# Patient Record
Sex: Male | Born: 1975 | Race: Black or African American | Hispanic: No | Marital: Single | Smoking: Current every day smoker
Health system: Southern US, Community
[De-identification: ages and names within clinical notes are randomized; demographics above are authoritative.]

## PROBLEM LIST (undated history)

## (undated) DIAGNOSIS — S62109A Fracture of unspecified carpal bone, unspecified wrist, initial encounter for closed fracture: Secondary | ICD-10-CM

## (undated) DIAGNOSIS — E079 Disorder of thyroid, unspecified: Secondary | ICD-10-CM

## (undated) DIAGNOSIS — M5430 Sciatica, unspecified side: Secondary | ICD-10-CM

## (undated) DIAGNOSIS — G43909 Migraine, unspecified, not intractable, without status migrainosus: Secondary | ICD-10-CM

## (undated) DIAGNOSIS — I10 Essential (primary) hypertension: Secondary | ICD-10-CM

## (undated) DIAGNOSIS — F419 Anxiety disorder, unspecified: Secondary | ICD-10-CM

## (undated) HISTORY — PX: NO PAST SURGERIES: SHX2092

---

## 1998-11-26 ENCOUNTER — Encounter: Payer: Self-pay | Admitting: *Deleted

## 1998-11-26 ENCOUNTER — Emergency Department (HOSPITAL_COMMUNITY): Admission: EM | Admit: 1998-11-26 | Discharge: 1998-11-26 | Payer: Self-pay | Admitting: *Deleted

## 2000-10-06 ENCOUNTER — Emergency Department (HOSPITAL_COMMUNITY): Admission: EM | Admit: 2000-10-06 | Discharge: 2000-10-07 | Payer: Self-pay | Admitting: Emergency Medicine

## 2000-10-07 ENCOUNTER — Emergency Department (HOSPITAL_COMMUNITY): Admission: EM | Admit: 2000-10-07 | Discharge: 2000-10-07 | Payer: Self-pay | Admitting: Emergency Medicine

## 2000-10-08 ENCOUNTER — Emergency Department (HOSPITAL_COMMUNITY): Admission: EM | Admit: 2000-10-08 | Discharge: 2000-10-08 | Payer: Self-pay | Admitting: Emergency Medicine

## 2001-02-15 ENCOUNTER — Emergency Department (HOSPITAL_COMMUNITY): Admission: EM | Admit: 2001-02-15 | Discharge: 2001-02-15 | Payer: Self-pay | Admitting: Emergency Medicine

## 2001-04-02 ENCOUNTER — Encounter: Payer: Self-pay | Admitting: Emergency Medicine

## 2001-04-02 ENCOUNTER — Emergency Department (HOSPITAL_COMMUNITY): Admission: EM | Admit: 2001-04-02 | Discharge: 2001-04-02 | Payer: Self-pay | Admitting: Emergency Medicine

## 2003-06-13 ENCOUNTER — Emergency Department (HOSPITAL_COMMUNITY): Admission: EM | Admit: 2003-06-13 | Discharge: 2003-06-13 | Payer: Self-pay | Admitting: Emergency Medicine

## 2003-08-15 ENCOUNTER — Emergency Department (HOSPITAL_COMMUNITY): Admission: EM | Admit: 2003-08-15 | Discharge: 2003-08-16 | Payer: Self-pay | Admitting: *Deleted

## 2006-02-04 ENCOUNTER — Emergency Department (HOSPITAL_COMMUNITY): Admission: EM | Admit: 2006-02-04 | Discharge: 2006-02-04 | Payer: Self-pay | Admitting: Emergency Medicine

## 2006-03-13 ENCOUNTER — Emergency Department (HOSPITAL_COMMUNITY): Admission: EM | Admit: 2006-03-13 | Discharge: 2006-03-14 | Payer: Self-pay | Admitting: Emergency Medicine

## 2008-10-26 ENCOUNTER — Emergency Department (HOSPITAL_COMMUNITY): Admission: EM | Admit: 2008-10-26 | Discharge: 2008-10-26 | Payer: Self-pay | Admitting: Family Medicine

## 2009-10-20 ENCOUNTER — Emergency Department (HOSPITAL_COMMUNITY): Admission: EM | Admit: 2009-10-20 | Discharge: 2009-10-20 | Payer: Self-pay | Admitting: Emergency Medicine

## 2010-01-17 ENCOUNTER — Emergency Department (HOSPITAL_COMMUNITY): Admission: EM | Admit: 2010-01-17 | Discharge: 2010-01-17 | Payer: Self-pay | Admitting: Emergency Medicine

## 2010-03-02 ENCOUNTER — Ambulatory Visit: Payer: Self-pay | Admitting: Internal Medicine

## 2010-06-06 LAB — POCT I-STAT, CHEM 8
BUN: 3 mg/dL — ABNORMAL LOW (ref 6–23)
Creatinine, Ser: 0.9 mg/dL (ref 0.4–1.5)
Potassium: 4 mEq/L (ref 3.5–5.1)
Sodium: 136 mEq/L (ref 135–145)

## 2011-01-20 ENCOUNTER — Ambulatory Visit (HOSPITAL_COMMUNITY): Payer: Self-pay

## 2011-01-20 ENCOUNTER — Emergency Department (HOSPITAL_COMMUNITY): Payer: Self-pay

## 2011-01-20 ENCOUNTER — Emergency Department (HOSPITAL_COMMUNITY)
Admission: EM | Admit: 2011-01-20 | Discharge: 2011-01-20 | Disposition: A | Discharge status: Home / Self Care | Payer: Self-pay | Attending: Emergency Medicine | Admitting: Emergency Medicine

## 2011-01-20 DIAGNOSIS — S1093XA Contusion of unspecified part of neck, initial encounter: Secondary | ICD-10-CM | POA: Insufficient documentation

## 2011-01-20 DIAGNOSIS — S0003XA Contusion of scalp, initial encounter: Secondary | ICD-10-CM | POA: Insufficient documentation

## 2011-01-20 DIAGNOSIS — I10 Essential (primary) hypertension: Secondary | ICD-10-CM | POA: Insufficient documentation

## 2011-01-20 DIAGNOSIS — W1809XA Striking against other object with subsequent fall, initial encounter: Secondary | ICD-10-CM | POA: Insufficient documentation

## 2011-01-20 DIAGNOSIS — Y92009 Unspecified place in unspecified non-institutional (private) residence as the place of occurrence of the external cause: Secondary | ICD-10-CM | POA: Insufficient documentation

## 2011-01-20 DIAGNOSIS — S0180XA Unspecified open wound of other part of head, initial encounter: Secondary | ICD-10-CM | POA: Insufficient documentation

## 2011-09-18 ENCOUNTER — Encounter (HOSPITAL_COMMUNITY): Payer: Self-pay | Admitting: Emergency Medicine

## 2011-09-18 ENCOUNTER — Emergency Department (HOSPITAL_COMMUNITY): Payer: Self-pay

## 2011-09-18 ENCOUNTER — Emergency Department (HOSPITAL_COMMUNITY)
Admission: EM | Admit: 2011-09-18 | Discharge: 2011-09-18 | Disposition: A | Discharge status: Home / Self Care | Payer: Self-pay | Attending: Emergency Medicine | Admitting: Emergency Medicine

## 2011-09-18 DIAGNOSIS — I10 Essential (primary) hypertension: Secondary | ICD-10-CM | POA: Insufficient documentation

## 2011-09-18 DIAGNOSIS — M25539 Pain in unspecified wrist: Secondary | ICD-10-CM | POA: Insufficient documentation

## 2011-09-18 DIAGNOSIS — F17200 Nicotine dependence, unspecified, uncomplicated: Secondary | ICD-10-CM | POA: Insufficient documentation

## 2011-09-18 HISTORY — DX: Essential (primary) hypertension: I10

## 2011-09-18 HISTORY — DX: Fracture of unspecified carpal bone, unspecified wrist, initial encounter for closed fracture: S62.109A

## 2011-09-18 MED ORDER — IBUPROFEN 400 MG PO TABS
800.0000 mg | ORAL_TABLET | Freq: Once | ORAL | Status: AC
  Administered 2011-09-18: 800 mg via ORAL
  Filled 2011-09-18: qty 2

## 2011-09-18 MED ORDER — HYDROCODONE-ACETAMINOPHEN 5-500 MG PO TABS: 1.0000 | ORAL_TABLET | Freq: Four times a day (QID) | ORAL | Status: AC | PRN

## 2011-09-18 MED ORDER — OXYCODONE-ACETAMINOPHEN 5-325 MG PO TABS: 1.0000 | ORAL_TABLET | Freq: Once | ORAL | Status: DC

## 2011-09-18 NOTE — Progress Notes (Signed)
Orthopedic Tech Progress Note Patient Details:  Caleb Heath Aug 02, 1975 [id number]  Ortho Devices Type of Ortho Device: Velcro wrist splint Ortho Device/Splint Location: right wrist Ortho Device/Splint Interventions: Application   Caleb Heath 09/18/2011, 7:35 PM

## 2011-09-18 NOTE — ED Notes (Signed)
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 CMS intact.

## 2011-09-18 NOTE — ED Provider Notes (Signed)
History     CSN: [id number]  Arrival date & time 09/18/11  1738   None     Chief Complaint  Patient presents with  . Wrist Pain    (Consider location/radiation/quality/duration/timing/severity/associated sxs/prior treatment) Patient is a 36 y.o. male presenting with wrist pain. The history is provided by the patient and a friend. No language interpreter was used.  Wrist Pain This is a recurrent problem. The current episode started yesterday. The problem occurs intermittently. The problem has been unchanged. Associated symptoms include arthralgias. Pertinent negatives include no fever, joint swelling, nausea, numbness, vomiting or weakness. The symptoms are aggravated by bending. He has tried nothing for the symptoms.  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  States that he had swelling and pain in the same wrist 2 weeks ago that resolved on its own.  No pmh of gout but intermittant great R toe pain.    Past Medical History  Diagnosis Date  . Fx wrist   . Hypertension     History reviewed. No pertinent past surgical history.  No family history on file.  History  Substance Use Topics  . Smoking status: Current Everyday Smoker  . Smokeless tobacco: Not on file  . Alcohol Use: Yes      Review of Systems  Constitutional: Negative.  Negative for fever.  HENT: Negative.   Eyes: Negative.   Respiratory: Negative.   Cardiovascular: Negative.   Gastrointestinal: Negative.  Negative for nausea and vomiting.  Musculoskeletal: Positive for arthralgias. Negative for joint swelling.  Neurological: Negative.  Negative for weakness and numbness.  Psychiatric/Behavioral: Negative.   All other systems reviewed and are negative.    Allergies  Review of patient's allergies indicates no known allergies.  Home Medications  No current outpatient prescriptions on file.  BP 155/91  Pulse 96  Temp 98.6 F (37 C) (Oral)  Resp 18  SpO2 100%  Physical Exam  Nursing  note and vitals reviewed. Constitutional: He is oriented to person, place, and time. He appears well-developed and well-nourished.  HENT:  Head: Normocephalic.  Eyes: Conjunctivae and EOM are normal. Pupils are equal, round, and reactive to light.  Neck: Normal range of motion. Neck supple.  Cardiovascular: Normal rate.   Pulmonary/Chest: Effort normal.  Abdominal: Soft.  Musculoskeletal: Normal range of motion.       Tender R wrist posterior R lateral no defomity noted.  2+ radial pulse.  Neurological: He is alert and oriented to person, place, and time.  Skin: Skin is warm and dry.  Psychiatric: He has a normal mood and affect.    ED Course  Procedures (including critical care time)  Labs Reviewed - No data to display Dg Wrist Complete Right  09/18/2011  *RADIOLOGY REPORT*  Clinical Data: Injury  RIGHT WRIST - COMPLETE 3+ VIEW  Comparison: None.  Findings: No acute fracture and no dislocation.  IMPRESSION: No acute bony pathology.  Original Report Authenticated By: Donavan Burnet, M.D.     No diagnosis found.    MDM  R wrist pain with negative films.  Ibuprofen for pain with vicodin rx.  Ice elevate.  Suspect gout.  Follow up with pcp        Remi Haggard, NP 09/18/11 2142

## 2011-09-18 NOTE — Discharge Instructions (Signed)
Caleb Heath the wrist pain could be gout pain.  Since this is recurrent and you also have great toe pain intermittantly.  Take ibuprofen 800mg  with food as needed for pain.  Take vicodin for severe pain but do not drive.  Stop drinking beer and eating red meat.  Google  Gout an read about this condition.  You may have a family member with the same condition.  Wear the wrist splint for comfort.

## 2011-09-18 NOTE — ED Notes (Signed)
Pt reports (R) wrist pain.  States that he has broken wrist x 2.  No swelling, obvious deformity noted.  Denies injury-reports working with wrists yesterday.  No distress noted.  Pulses 2+

## 2011-09-20 NOTE — ED Provider Notes (Signed)
Medical screening examination/treatment/procedure(s) were performed by non-physician practitioner and as supervising physician I was immediately available for consultation/collaboration.    Dayleen Beske L Jesika Men, MD 09/20/11 0002 

## 2012-08-24 ENCOUNTER — Encounter (HOSPITAL_COMMUNITY): Payer: Self-pay | Admitting: Emergency Medicine

## 2012-08-24 ENCOUNTER — Emergency Department (HOSPITAL_COMMUNITY)
Admission: EM | Admit: 2012-08-24 | Discharge: 2012-08-24 | Disposition: A | Discharge status: Home / Self Care | Payer: Self-pay | Attending: Emergency Medicine | Admitting: Emergency Medicine

## 2012-08-24 ENCOUNTER — Emergency Department (HOSPITAL_COMMUNITY): Payer: Self-pay

## 2012-08-24 DIAGNOSIS — R42 Dizziness and giddiness: Secondary | ICD-10-CM | POA: Insufficient documentation

## 2012-08-24 DIAGNOSIS — Z87828 Personal history of other (healed) physical injury and trauma: Secondary | ICD-10-CM | POA: Insufficient documentation

## 2012-08-24 DIAGNOSIS — F17200 Nicotine dependence, unspecified, uncomplicated: Secondary | ICD-10-CM | POA: Insufficient documentation

## 2012-08-24 DIAGNOSIS — R0789 Other chest pain: Secondary | ICD-10-CM | POA: Insufficient documentation

## 2012-08-24 DIAGNOSIS — R079 Chest pain, unspecified: Secondary | ICD-10-CM

## 2012-08-24 DIAGNOSIS — R0609 Other forms of dyspnea: Secondary | ICD-10-CM | POA: Insufficient documentation

## 2012-08-24 DIAGNOSIS — F411 Generalized anxiety disorder: Secondary | ICD-10-CM | POA: Insufficient documentation

## 2012-08-24 DIAGNOSIS — R0989 Other specified symptoms and signs involving the circulatory and respiratory systems: Secondary | ICD-10-CM | POA: Insufficient documentation

## 2012-08-24 DIAGNOSIS — H538 Other visual disturbances: Secondary | ICD-10-CM | POA: Insufficient documentation

## 2012-08-24 DIAGNOSIS — I10 Essential (primary) hypertension: Secondary | ICD-10-CM | POA: Insufficient documentation

## 2012-08-24 HISTORY — DX: Anxiety disorder, unspecified: F41.9

## 2012-08-24 LAB — CBC
Hemoglobin: 16 g/dL (ref 13.0–17.0)
MCH: 32.4 pg (ref 26.0–34.0)
MCV: 89.9 fL (ref 78.0–100.0)
RBC: 4.94 MIL/uL (ref 4.22–5.81)
WBC: 7.5 10*3/uL (ref 4.0–10.5)

## 2012-08-24 LAB — COMPREHENSIVE METABOLIC PANEL
ALT: 22 U/L (ref 0–53)
AST: 41 U/L — ABNORMAL HIGH (ref 0–37)
CO2: 27 mEq/L (ref 19–32)
Calcium: 9.7 mg/dL (ref 8.4–10.5)
Chloride: 103 mEq/L (ref 96–112)
Creatinine, Ser: 0.82 mg/dL (ref 0.50–1.35)
GFR calc Af Amer: >90 mL/min (ref >90)
GFR calc non Af Amer: >90 mL/min (ref >90)
Glucose, Bld: 87 mg/dL (ref 70–99)
Sodium: 142 mEq/L (ref 135–145)
Total Bilirubin: 0.2 mg/dL — ABNORMAL LOW (ref 0.3–1.2)

## 2012-08-24 LAB — URINALYSIS, ROUTINE W REFLEX MICROSCOPIC
Bilirubin Urine: NEGATIVE
Hgb urine dipstick: NEGATIVE
Nitrite: NEGATIVE
Protein, ur: NEGATIVE mg/dL
Specific Gravity, Urine: 1.012 (ref 1.005–1.030)
Urobilinogen, UA: 0.2 mg/dL (ref 0.0–1.0)

## 2012-08-24 LAB — POCT I-STAT TROPONIN I

## 2012-08-24 MED ORDER — HYDROCHLOROTHIAZIDE 12.5 MG PO CAPS: 12.5000 mg | ORAL_CAPSULE | Freq: Every day | ORAL | Status: DC

## 2012-08-24 NOTE — ED Provider Notes (Signed)
Medical screening examination/treatment/procedure(s) were conducted as a shared visit with non-physician practitioner(s) and myself.  I personally evaluated the patient during the encounter  Pt improved, resting when I enter the room I reviewed EKG Advised need to get his BP under control I doubt ACS/PE/Dissection at this time Stable for d/c  Joya Gaskins, MD 08/24/12 2024

## 2012-08-24 NOTE — ED Notes (Signed)
Was at work got dizzy and his chest started to hurt lefty side of chest some sob has hx of anxiety and htn

## 2012-08-24 NOTE — ED Notes (Signed)
States partied last night and had situation w/ girlfriend that he has been worried about  Denies drug use

## 2012-08-24 NOTE — ED Provider Notes (Signed)
History     CSN: [id number]  Arrival date & time 08/24/12  1153   First MD Initiated Contact with Patient 08/24/12 1326      Chief Complaint  Patient presents with  . Chest Pain    (Consider location/radiation/quality/duration/timing/severity/associated sxs/prior treatment) HPI  37 year old male presents emergency department with chief complaint of chest pain.  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  Patient states that his dizziness was overwhelming him to sit down.  Patient states he did inside the truck and then began having racing heart and chest pressure with tightness.  He has had associated dyspnea at.  This is all while at rest.  Patient has a history of anxiety attack.  He should also admits to heavy alcohol use yesterday.  He did not specify a specific amount but states it was "a lot."  The patient states he frequently binge drinks on his day off which is Sunday. Denies unilateral weakness, facial asymmetry, difficulty with speech, change in gait, or vertigo.   Past Medical History  Diagnosis Date  . Fx wrist   . Hypertension   . Anxiety     No past surgical history on file.  No family history on file.  History  Substance Use Topics  . Smoking status: Current Every Day Smoker  . Smokeless tobacco: Not on file  . Alcohol Use: Yes      Review of Systems Ten systems reviewed and are negative for acute change, except as noted in the HPI.   Allergies  Review of patient's allergies indicates no known allergies.  Home Medications  No current outpatient prescriptions on file.  BP 155/113  Pulse 118  Temp(Src) 98.8 F (37.1 C)  Resp 16  SpO2 100%  Physical Exam  Nursing note and vitals reviewed. Constitutional: He appears well-developed and well-nourished. No distress.  HENT:  Head: Normocephalic and atraumatic.  Eyes: Conjunctivae are normal. No scleral icterus.  Neck: Normal  range of motion. Neck supple.  Cardiovascular: Normal rate, regular rhythm and normal heart sounds.   Pulmonary/Chest: Effort normal and breath sounds normal. No respiratory distress.  Abdominal: Soft. There is no tenderness.  Musculoskeletal: He exhibits no edema.  Neurological: He is alert.  Skin: Skin is warm and dry. He is not diaphoretic.  Psychiatric: His behavior is normal.    ED Course  Procedures (including critical care time)  Labs Reviewed  COMPREHENSIVE METABOLIC PANEL - Abnormal; Notable for the following:    AST 41 (*)    Total Bilirubin 0.2 (*)    All other components within normal limits  URINALYSIS, ROUTINE W REFLEX MICROSCOPIC - Abnormal; Notable for the following:    APPearance CLOUDY (*)    All other components within normal limits  CBC  POCT I-STAT TROPONIN I   Dg Chest 2 View  08/24/2012   *RADIOLOGY REPORT*  Clinical Data: Chest pain  CHEST - 2 VIEW  Comparison: 03/13/2006  Findings: Cardiomediastinal silhouette is unremarkable.  No acute infiltrate or pleural effusion.  No pulmonary edema.  Bony thorax is unremarkable.  IMPRESSION: No active disease.   Original Report Authenticated By: Natasha Mead, M.D.     1. Chest pain      Date: 08/24/2012  Rate: 116   Rhythm: normal sinus rhythm  QRS Axis: normal  Intervals: normal  ST/T Wave abnormalities: ST elevations anteriorly and ST elevations laterally  Conduction Disutrbances:none  Narrative Interpretation: Abnormal ekg, same as  previous LVH   Old EKG Reviewed: unchanged    MDM   4:50 PM BP 158/92  Pulse 85  Temp(Src) 98.8 F (37.1 C)  Resp 18  SpO2 96% Patient here with chest pain complaint.  Also feeling tachycardic.  Differential includes heart attack, tach or arrhythmia, anxiety attack, alcohol withdrawal Vision head no symptoms at onset of initial visit. He is remained chest pain-free throughout and without symptoms of pounding or racing heart, dizziness, blurred vision Patient has had 2  negative troponins while here in the emergency department.  He has been seen in chair visit with Dr. Zadie Rhine. Spent approximately 3 minutes in consultation with the patient regarding his binge alcohol abuse. We'll discharge the patient with 12.5 HCTZ daily.  Resource guide given. The patient appears reasonably screened and/or stabilized for discharge and I doubt any other medical condition or other [facility] requiring further screening, evaluation, or treatment in the ED at this time prior to discharge.        Arthor Captain, PA-C 08/24/12 1656

## 2012-08-24 NOTE — ED Notes (Signed)
Pt c/o of lightheadedness while he was at work [employer]), reports he gets them often but this one lasted longer than normal. Pt reports during this episode he felt like his chest was "constricting", pt reports it felt like left sided chest tightness. Pt became SOB and diaphoretic and slightly blurred vision. Pt at first he thought it was just an anxiety attack but it didn't go away until pt arrived at ED, reports normally his anxiety attacks don't last that long. Pt reports he had just ate prior to this event. Pt has hx of HTN, pt reports he is not on any medications for this issue, pt denies checking it regularly. Pt denies n/v/d. Pt in nad, skin warm and dry, resp e/u.

## 2012-08-24 NOTE — ED Notes (Signed)
Has run out of bp meds x 1 year

## 2012-09-02 ENCOUNTER — Ambulatory Visit: Payer: Self-pay | Attending: Family Medicine | Admitting: Family Medicine

## 2012-09-02 ENCOUNTER — Encounter: Payer: Self-pay | Admitting: Family Medicine

## 2012-09-02 VITALS — BP 146/91 | HR 97 | Temp 98.7°F | Ht 74.0 in | Wt 175.0 lb

## 2012-09-02 DIAGNOSIS — R252 Cramp and spasm: Secondary | ICD-10-CM | POA: Insufficient documentation

## 2012-09-02 DIAGNOSIS — I10 Essential (primary) hypertension: Secondary | ICD-10-CM

## 2012-09-02 LAB — COMPREHENSIVE METABOLIC PANEL
Albumin: 4.7 g/dL (ref 3.5–5.2)
BUN: 7 mg/dL (ref 6–23)
CO2: 28 mEq/L (ref 19–32)
Calcium: 9.9 mg/dL (ref 8.4–10.5)
Chloride: 101 mEq/L (ref 96–112)
Glucose, Bld: 89 mg/dL (ref 70–99)
Potassium: 3.7 mEq/L (ref 3.5–5.3)
Sodium: 137 mEq/L (ref 135–145)
Total Protein: 7 g/dL (ref 6.0–8.3)

## 2012-09-02 MED ORDER — POTASSIUM CHLORIDE ER 10 MEQ PO TBCR: 10.0000 meq | EXTENDED_RELEASE_TABLET | Freq: Every day | ORAL | Status: DC

## 2012-09-02 MED ORDER — LISINOPRIL-HYDROCHLOROTHIAZIDE 10-12.5 MG PO TABS: 1.0000 | ORAL_TABLET | Freq: Every day | ORAL | Status: DC

## 2012-09-02 NOTE — Patient Instructions (Addendum)
Alcohol and Nutrition Nutrition serves two purposes. It provides energy. It also maintains body structure and function. Food supplies energy. It also provides the building blocks needed to replace worn or damaged cells. Alcoholics often eat poorly. This limits their supply of essential nutrients. This affects energy supply and structure maintenance. Alcohol also affects the body's nutrients in:  Digestion.  Storage.  Using and getting rid of waste products. IMPAIRMENT OF NUTRIENT DIGESTION AND UTILIZATION   Once ingested, food must be broken down into small components (digested). Then it is available for energy. It helps maintain body structure and function. Digestion begins in the mouth. It continues in the stomach and intestines, with help from the pancreas. The nutrients from digested food are absorbed from the intestines into the blood. Then they are carried to the liver. The liver prepares nutrients for:  Immediate use.  Storage and future use.  Alcohol inhibits the breakdown of nutrients into usable molecules.  It decreases secretion of digestive enzymes from the pancreas.  Alcohol impairs nutrient absorption by damaging the cells lining the stomach and intestines.  It also interferes with moving some nutrients into the blood.  In addition, nutritional deficiencies themselves may lead to further absorption problems.  For example, folate deficiency changes the cells that line the small intestine. This impairs how water is absorbed. It also affects absorbed nutrients. These include glucose, sodium, and additional folate.  Even if nutrients are digested and absorbed, alcohol can prevent them from being fully used. It changes their transport, storage, and excretion. Impaired utilization of nutrients by alcoholics is indicated by:  Decreased liver stores of vitamins, such as vitamin A.  Increased excretion of nutrients such as fat. ALCOHOL AND ENERGY SUPPLY   Three basic  nutritional components found in food are:  Carbohydrates.  Proteins.  Fats.  These are used as energy. Some alcoholics take in as much as 50% of their total daily calories from alcohol. They often neglect important foods.  Even when enough food is eaten, alcohol can impair the ways the body controls blood sugar (glucose) levels. It may either increase or decrease blood sugar.  In non-diabetic alcoholics, increased blood sugar (hyperglycemia) is caused by poor insulin secretion. It is usually temporary.  Decreased blood sugar (hypoglycemia) can cause serious injury even if this condition is short-lived. Low blood sugar can happen when a fasting or malnourished person drinks alcohol. When there is no food to supply energy, stored sugar is used up. The products of alcohol inhibit forming glucose from other compounds such as amino acids. As a result, alcohol causes the brain and other body tissue to lack glucose. It is needed for energy and function.  Alcohol is an energy source. But how the body processes and uses the energy from alcohol is complex. Also, when alcohol is substituted for carbohydrates, subjects tend to lose weight. This indicates that they get less energy from alcohol than from food. ALCOHOL - MAINTAINING CELL STRUCTURE AND FUNCTION  Structure Cells are made mostly of protein. So an adequate protein diet is important for maintaining cell structure. This is especially true if cells are being damaged. Research indicates that alcohol affects protein nutrition by causing impaired:  Digestion of proteins to amino acids.  Processing of amino acids by the small intestine and liver.  Synthesis of proteins from amino acids.  Protein secretion by the liver. Function Nutrients are essential for the body to function well. They provide the tools that the body needs to work well:  Proteins.  Vitamins.  Minerals. Alcohol can disrupt body function. It may cause nutrient  deficiencies. And it may interfere with the way nutrients are processed. Vitamins  Vitamins are essential to maintain growth and normal metabolism. They regulate many of the body`s processes. Chronic heavy drinking causes deficiencies in many vitamins. This is caused by eating less. And, in some cases, vitamins may be poorly absorbed. For example, alcohol inhibits fat absorption. It impairs how the vitamins A, E, and D are normally absorbed along with dietary fats. Not enough vitamin A may cause night blindness. Not enough vitamin D may cause softening of the bones.  Some alcoholics lack vitamins A, C, D, E, K, and the B vitamins. These are all involved in wound healing and cell maintenance. In particular, because vitamin K is necessary for blood clotting, lacking that vitamin can cause delayed clotting. The result is excess bleeding. Lacking other vitamins involved in brain function may cause severe neurological damage. Minerals Deficiencies of minerals such as calcium, magnesium, iron, and zinc are common in alcoholics. The alcohol itself does not seem to affect how these minerals are absorbed. Rather, they seem to occur secondary to other alcohol-related problems, such as:  Less calcium absorbed.  Not enough magnesium.  More urinary excretion.  Vomiting.  Diarrhea.  Not enough iron due to gastrointestinal bleeding.  Not enough zinc or losses related to other nutrient deficiencies.  Mineral deficiencies can cause a variety of medical consequences. These range from calcium-related bone disease to zinc-related night blindness and skin lesions. ALCOHOL, MALNUTRITION, AND MEDICAL COMPLICATIONS  Liver Disease   Alcoholic liver damage is caused primarily by alcohol itself. But poor nutrition may increase the risk of alcohol-related liver damage. For example, nutrients normally found in the liver are known to be affected by drinking alcohol. These include carotenoids, which are the major  sources of vitamin A, and vitamin E compounds. Decreases in such nutrients may play some role in alcohol-related liver damage. Pancreatitis  Research suggests that malnutrition may increase the risk of developing alcoholic pancreatitis. Research suggests that a diet lacking in protein may increase alcohol's damaging effect on the pancreas. Brain  Nutritional deficiencies may have severe effects on brain function. These may be permanent. Specifically, thiamine deficiencies are often seen in alcoholics. They can cause severe neurological problems. These include:  Impaired movement.  Memory loss seen in Wernicke-Korsakoff syndrome. Pregnancy  Alcohol has toxic effects on fetal development. It causes alcohol-related birth defects. They include fetal alcohol syndrome. Alcohol itself is toxic to the fetus. Also, the nutritional deficiency can affect how the fetus develops. That may compound the risk of developmental damage.  Nutritional needs during pregnancy are 10% to 30% greater than normal. Food intake can increase by as much as 140% to cover the needs of both mother and fetus. An alcoholic mother`s nutritional problems may adversely affect the nutrition of the fetus. And alcohol itself can also restrict nutrition flow to the fetus. NUTRITIONAL STATUS OF ALCOHOLICS  Techniques for assessing nutritional status include:  Taking body measurements to estimate fat reserves. They include:  Weight.  Height.  Mass.  Skin fold thickness.  Performing blood analysis to provide measurements of circulating:  Proteins.  Vitamins.  Minerals.  These techniques tend to be imprecise. For many nutrients, there is no clear "cut-off" point that would allow an accurate definition of deficiency. So assessing the nutritional status of alcoholics is limited by these techniques. Dietary status may provide information about the risk of developing nutritional problems.  Dietary status is assessed by:  Taking  patients' dietary histories.  Evaluating the amount and types of food they are eating.  It is difficult to determine what exact amount of alcohol begins to have damaging effects on nutrition. In general, moderate drinkers have 2 drinks or less per day. They seem to be at little risk for nutritional problems. Various medical disorders begin to appear at greater levels.  Research indicates that the majority of even the heaviest drinkers have few obvious nutritional deficiencies. Many alcoholics who are hospitalized for medical complications of their disease do have severe malnutrition. Alcoholics tend to eat poorly. Often they eat less than the amounts of food necessary to provide enough:  Carbohydrates.  Protein.  Fat.  Vitamins A and C.  B vitamins.  Minerals like calcium and iron. Of major concern is alcohol's effect on digesting food and use of nutrients. It may shift a mildly malnourished person toward severe malnutrition. Document Released: 01/03/2005 Document Revised: 06/03/2011 Document Reviewed: 06/19/2005 [facility] Patient Information 2014 [place], [place]. Muscle Cramps Muscle cramps are when muscles tighten by themselves. Muscle cramps usually improve or go away within minutes. HOME CARE  Massage the muscle.  Stretch the muscle.  Relax the muscle.  Only take medicine as told by your doctor.  Drink enough fluids to keep your pee (urine) clear or pale yellow. GET HELP RIGHT AWAY IF:  Cramps are frequent and do not get better with medicine. MAKE SURE YOU:  Understand these intructions.  Will watch your condition.  Will get help right away if your are not doing well or get worse. Document Released: 02/22/2008 Document Revised: 06/03/2011 Document Reviewed: 03/02/2008 [facility] Patient Information 2014 [place], [place]. Potassium (K) Potassium is an electrolyte that helps regulate the amount of fluid in the body. It also stimulates muscle contraction and maintains a  stable acid-base balance. Most of the body's potassium is inside of cells, and only a very small amount is in the blood. Because the amount in the blood is so small, minor changes can have big effects. PREPARATION FOR TEST Testing for potassium requires taking a blood sample taken by needle from a vein in the arm. The skin is cleaned thoroughly before the sample is drawn. There is no other special preparation needed. NORMAL FINDINGS  Adults: 3.5-5 mEq/L (3.5-5 mmol/L).  Premature neonates, cord blood: 5-10.2 mEq/L (5-10.2 mmol/L).  Premature neonates, 48 hours: 3-6 mEq/L (3-6 mmol/L).  Neonates: 3.7-5.9 mEq/L (3.7-5.9 mmol/L).  Neonates, cord blood: 5.6-12 mEq/L (5.6-12 mmol/L).  Infants: 4.1-5.3 mEq/L (4.1-5.3 mmol/L).  Children: 3.4-4.7 mEq/L (3.4-4.7 mmol/L). Ranges for normal findings may vary among different laboratories and hospitals. You should always check with your doctor after having lab work or other tests done to discuss the meaning of your test results and whether your values are considered within normal limits. MEANING OF TEST Your caregiver will go over the test results with you and discuss the importance and meaning of your results, as well as treatment options and the need for additional tests if necessary. OBTAINING THE TEST RESULTS It is your responsibility to obtain your test results. Ask the lab or department performing the test when and how you will get your results. Document Released: 04/13/2004 Document Revised: 06/03/2011 Document Reviewed: 02/21/2008 [facility] Patient Information 2014 [place], [place]. Hypertension As your heart beats, it forces blood through your arteries. This force is your blood pressure. If the pressure is too high, it is called hypertension (HTN) or high blood pressure. HTN is dangerous because you  may have it and not know it. High blood pressure may mean that your heart has to work harder to pump blood. Your arteries may be narrow or stiff. The  extra work puts you at risk for heart disease, stroke, and other problems.  Blood pressure consists of two numbers, a higher number over a lower, 110/72, for example. It is stated as "110 over 72." The ideal is below 120 for the top number (systolic) and under 80 for the bottom (diastolic). Write down your blood pressure today. You should pay close attention to your blood pressure if you have certain conditions such as:  Heart failure.  Prior heart attack.  Diabetes  Chronic kidney disease.  Prior stroke.  Multiple risk factors for heart disease. To see if you have HTN, your blood pressure should be measured while you are seated with your arm held at the level of the heart. It should be measured at least twice. A one-time elevated blood pressure reading (especially in the Emergency Department) does not mean that you need treatment. There may be conditions in which the blood pressure is different between your right and left arms. It is important to see your caregiver soon for a recheck. Most people have essential hypertension which means that there is not a specific cause. This type of high blood pressure may be lowered by changing lifestyle factors such as:  Stress.  Smoking.  Lack of exercise.  Excessive weight.  Drug/tobacco/alcohol use.  Eating less salt. Most people do not have symptoms from high blood pressure until it has caused damage to the body. Effective treatment can often prevent, delay or reduce that damage. TREATMENT  When a cause has been identified, treatment for high blood pressure is directed at the cause. There are a large number of medications to treat HTN. These fall into several categories, and your caregiver will help you select the medicines that are best for you. Medications may have side effects. You should review side effects with your caregiver. If your blood pressure stays high after you have made lifestyle changes or started on medicines,   Your  medication(s) may need to be changed.  Other problems may need to be addressed.  Be certain you understand your prescriptions, and know how and when to take your medicine.  Be sure to follow up with your caregiver within the time frame advised (usually within two weeks) to have your blood pressure rechecked and to review your medications.  If you are taking more than one medicine to lower your blood pressure, make sure you know how and at what times they should be taken. Taking two medicines at the same time can result in blood pressure that is too low. SEEK IMMEDIATE MEDICAL CARE IF:  You develop a severe headache, blurred or changing vision, or confusion.  You have unusual weakness or numbness, or a faint feeling.  You have severe chest or abdominal pain, vomiting, or breathing problems. MAKE SURE YOU:   Understand these instructions.  Will watch your condition.  Will get help right away if you are not doing well or get worse. Document Released: 03/11/2005 Document Revised: 06/03/2011 Document Reviewed: 10/30/2007 [facility] Patient Information 2014 [place], [place].

## 2012-09-02 NOTE — Progress Notes (Signed)
Patient ID: Caleb Heath, male   DOB: Oct 05, 1975, 37 y.o.   MRN: [id number]  CC: to establish  HPI: Pt is presenting today to follow up from ER visit where he was diagnosed with hypertension.  He was started on HCTZ 12.5 mg po daily.  He has been having cramps since he started the medication.  He works a very physical job.   No Known Allergies Past Medical History  Diagnosis Date  . Fx wrist   . Hypertension   . Anxiety    No current outpatient prescriptions on file prior to visit.   No current facility-administered medications on file prior to visit.   No family history on file. History   Social History  . Marital Status: Single    Spouse Name: N/A    Number of Children: N/A  . Years of Education: N/A   Occupational History  . Not on file.   Social History Main Topics  . Smoking status: Current Every Day Smoker  . Smokeless tobacco: Not on file  . Alcohol Use: Yes  . Drug Use: No  . Sexually Active: Not on file   Other Topics Concern  . Not on file   Social History Narrative  . No narrative on file    Review of Systems  Constitutional: Negative for fever, chills, diaphoresis, activity change, appetite change and fatigue.  HENT: Negative for ear pain, nosebleeds, congestion, facial swelling, rhinorrhea, neck pain, neck stiffness and ear discharge.   Eyes: Negative for pain, discharge, redness, itching and visual disturbance.  Respiratory: Negative for cough, choking, chest tightness, shortness of breath, wheezing and stridor.   Cardiovascular: Negative for chest pain, palpitations and leg swelling.  Gastrointestinal: Negative for abdominal distention.  Genitourinary: Negative for dysuria, urgency, frequency, hematuria, flank pain, decreased urine volume, difficulty urinating and dyspareunia.  Musculoskeletal: Negative for back pain, joint swelling, arthralgias and gait problem.  Neurological: Negative for dizziness, tremors, seizures, syncope, facial asymmetry,  speech difficulty, weakness, light-headedness, numbness and headaches.  Hematological: Negative for adenopathy. Does not bruise/bleed easily.  Psychiatric/Behavioral: Negative for hallucinations, behavioral problems, confusion, dysphoric mood, decreased concentration and agitation.    Objective:   Filed Vitals:   09/02/12 1208  BP: 146/91  Pulse: 97  Temp: 98.7 F (37.1 C)    Physical Exam  Constitutional: Appears well-developed and well-nourished. No distress.  HENT: Normocephalic. External right and left ear normal. Oropharynx is clear and moist.  Eyes: Conjunctivae and EOM are normal. PERRLA, no scleral icterus.  Neck: Normal ROM. Neck supple. No JVD. No tracheal deviation. No thyromegaly.  CVS: RRR, S1/S2 +, no murmurs, no gallops, no carotid bruit.  Pulmonary: Effort and breath sounds normal, no stridor, rhonchi, wheezes, rales.  Abdominal: Soft. BS +,  no distension, tenderness, rebound or guarding.  Musculoskeletal: Normal range of motion. No edema and no tenderness.  Lymphadenopathy: No lymphadenopathy noted, cervical, inguinal. Neuro: Alert. Normal reflexes, muscle tone coordination. No cranial nerve deficit. Skin: Skin is warm and dry. No rash noted. Not diaphoretic. No erythema. No pallor.  Psychiatric: Normal mood and affect. Behavior, judgment, thought content normal.   Lab Results  Component Value Date   WBC 7.5 08/24/2012   HGB 16.0 08/24/2012   HCT 44.4 08/24/2012   MCV 89.9 08/24/2012   PLT 229 08/24/2012   Lab Results  Component Value Date   CREATININE 0.82 08/24/2012   BUN 10 08/24/2012   NA 142 08/24/2012   K 3.9 08/24/2012   CL 103 08/24/2012   CO2  27 08/24/2012    No results found for this basename: HGBA1C   Lipid Panel  No results found for this basename: chol, trig, hdl, cholhdl, vldl, ldlcalc       Assessment and plan:   Patient Active Problem List   Diagnosis Date Noted  . Unspecified essential hypertension 09/02/2012  . Muscle cramps 09/02/2012    Trial of zestoretic 10/12.5 mg po daily for better blood pressure control  Check CMP today Add KCl 10 meq po daily to regimen Hydration recommended Alcohol cessation discussed [id number] The patient was counseled on the dangers of tobacco use, and was advised to quit.  Reviewed strategies to maximize success, including removing cigarettes and smoking materials from environment.   Follow up in 3 weeks  The patient was given clear instructions to go to ER or return to medical center if symptoms don't improve, worsen or new problems develop.  The patient verbalized understanding.  The patient was told to call to get lab results if they haven't heard anything in the next week.    Rodney Langton, MD, CDE, FAAFP [facility] [place], [place]

## 2012-09-03 ENCOUNTER — Telehealth: Payer: Self-pay | Admitting: *Deleted

## 2012-09-03 NOTE — Telephone Encounter (Signed)
09/03/12 Spoke with patient regarding labs came back WNL except that he had a mildly elevated liver enzyme. Patient  Instructed to avoid heavy alcohol consumption verbalize and understands.Appointment schedule for 12/04/12 for CMP. P.Jolyn Deshmukh,RN BSN [facility]

## 2012-09-03 NOTE — Progress Notes (Signed)
Quick Note:  Please inform patient that his labs came back within normal limits except that he had a mildly elevated liver enzyme. Patient should avoid heavy alcohol consumption and we should recheck his labs again in 3 months.  Rodney Langton, MD, CDE, FAAFP [facility] [place], [place]   ______

## 2012-09-29 ENCOUNTER — Emergency Department (HOSPITAL_COMMUNITY)
Admission: EM | Admit: 2012-09-29 | Discharge: 2012-09-30 | Disposition: A | Discharge status: Home / Self Care | Payer: Self-pay | Attending: Emergency Medicine | Admitting: Emergency Medicine

## 2012-09-29 ENCOUNTER — Encounter (HOSPITAL_COMMUNITY): Payer: Self-pay | Admitting: Emergency Medicine

## 2012-09-29 DIAGNOSIS — I10 Essential (primary) hypertension: Secondary | ICD-10-CM | POA: Insufficient documentation

## 2012-09-29 DIAGNOSIS — R252 Cramp and spasm: Secondary | ICD-10-CM | POA: Insufficient documentation

## 2012-09-29 DIAGNOSIS — R7989 Other specified abnormal findings of blood chemistry: Secondary | ICD-10-CM

## 2012-09-29 DIAGNOSIS — Z8659 Personal history of other mental and behavioral disorders: Secondary | ICD-10-CM | POA: Insufficient documentation

## 2012-09-29 DIAGNOSIS — E860 Dehydration: Secondary | ICD-10-CM | POA: Insufficient documentation

## 2012-09-29 DIAGNOSIS — Z8781 Personal history of (healed) traumatic fracture: Secondary | ICD-10-CM | POA: Insufficient documentation

## 2012-09-29 DIAGNOSIS — Z79899 Other long term (current) drug therapy: Secondary | ICD-10-CM | POA: Insufficient documentation

## 2012-09-29 DIAGNOSIS — R944 Abnormal results of kidney function studies: Secondary | ICD-10-CM | POA: Insufficient documentation

## 2012-09-29 DIAGNOSIS — R42 Dizziness and giddiness: Secondary | ICD-10-CM | POA: Insufficient documentation

## 2012-09-29 DIAGNOSIS — F17200 Nicotine dependence, unspecified, uncomplicated: Secondary | ICD-10-CM | POA: Insufficient documentation

## 2012-09-29 LAB — CBC WITH DIFFERENTIAL/PLATELET
Eosinophils Absolute: 0.1 10*3/uL (ref 0.0–0.7)
Hemoglobin: 15.8 g/dL (ref 13.0–17.0)
Lymphs Abs: 2.6 10*3/uL (ref 0.7–4.0)
MCH: 31.9 pg (ref 26.0–34.0)
Monocytes Relative: 13 % — ABNORMAL HIGH (ref 3–12)
Neutro Abs: 5.1 10*3/uL (ref 1.7–7.7)
Neutrophils Relative %: 57 % (ref 43–77)
Platelets: 217 10*3/uL (ref 150–400)
RBC: 4.96 MIL/uL (ref 4.22–5.81)
WBC: 8.9 10*3/uL (ref 4.0–10.5)

## 2012-09-29 LAB — POCT I-STAT TROPONIN I

## 2012-09-29 MED ORDER — LACTATED RINGERS IV BOLUS (SEPSIS)
1000.0000 mL | Freq: Once | INTRAVENOUS | Status: AC
  Administered 2012-09-30: 1000 mL via INTRAVENOUS

## 2012-09-29 MED ORDER — SODIUM CHLORIDE 0.9 % IV BOLUS (SEPSIS)
1000.0000 mL | Freq: Once | INTRAVENOUS | Status: AC
  Administered 2012-09-29: 1000 mL via INTRAVENOUS

## 2012-09-29 NOTE — ED Provider Notes (Signed)
History    CSN: [id number] Arrival date & time 09/29/12  2159  First MD Initiated Contact with Patient 09/29/12 2212     Chief Complaint  Patient presents with  . Diarrhea  . Muscle Pain    HPI Caleb Heath is a 37 y.o. 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 He says he's had occasional dizziness when he bends over this is associated with lightheadedness. He does not get dizzy or lightheaded when he stays upright. He also describes some chest wall cramping but denies any chest pains, shortness of breath, fever, chills, productive cough, vomiting or nausea. Patient does endorse 2 episodes of watery stools this evening. Cramps are been intermittent, cramping, moderate to severe. Patient says he drank some beer on 7/4, but denies daily alcohol use and denies any other illicit drug use. Past Medical History  Diagnosis Date  . Fx wrist   . Hypertension   . Anxiety    History reviewed. No pertinent past surgical history. No family history on file. History  Substance Use Topics  . Smoking status: Current Every Day Smoker  . Smokeless tobacco: Not on file  . Alcohol Use: Yes    Review of Systems At least 10pt or greater review of systems completed and are negative except where specified in the HPI.  Allergies  Review of patient's allergies indicates no known allergies.  Home Medications   Current Outpatient Rx  Name  Route  Sig  Dispense  Refill  . lisinopril-hydrochlorothiazide (PRINZIDE,ZESTORETIC) 10-12.5 MG per tablet   Oral   Take 1 tablet by mouth daily.   30 tablet   3   . potassium chloride (K-DUR) 10 MEQ tablet   Oral   Take 1 tablet (10 mEq total) by mouth daily.   30 tablet   3    BP 132/85  Pulse 127  Temp(Src) 98.4 F (36.9 C) (Oral)  Resp 18  SpO2 99% Physical Exam  Nursing notes  reviewed.  Electronic medical record reviewed. VITAL SIGNS:   Filed Vitals:   09/29/12 2330 09/29/12 2345 09/30/12 0000 09/30/12 0338  BP: 138/88 137/86 145/89 137/87  Pulse:    83  Temp:      TempSrc:      Resp: 18 21 18 18   SpO2:    97%   CONSTITUTIONAL: Awake, oriented, appears non-toxic HENT: Atraumatic, normocephalic, oral mucosa pink and moist, airway patent. Nares patent without drainage. External ears normal. EYES: Conjunctiva clear, EOMI, PERRLA NECK: Trachea midline, non-tender, supple CARDIOVASCULAR: Tachycardic rate, Normal rhythm, No murmurs, rubs, gallops PULMONARY/CHEST: Clear to auscultation, no rhonchi, wheezes, or rales. Symmetrical breath sounds. Non-tender. ABDOMINAL: Non-distended, soft, non-tender - no rebound or guarding.  BS normal. NEUROLOGIC: Non-focal, moving all four extremities, no gross sensory or motor deficits. EXTREMITIES: No clubbing, cyanosis, or edema SKIN: Warm, Dry, No erythema, No rash  ED Course  Procedures (including critical care time)  Date: 09/30/2012  Rate: 95  Rhythm: normal sinus rhythm  QRS Axis: normal  Intervals: normal  ST/T Wave abnormalities: ST elevation in V2 and V3, T-wave abnormality in V4 and V5  Conduction Disutrbances: none  Narrative Interpretation: EKG unchanged from prior EKG     Labs Reviewed  CBC WITH DIFFERENTIAL - Abnormal; Notable for the following:    Monocytes Relative 13 (*)    Monocytes Absolute  1.1 (*)    All other components within normal limits  COMPREHENSIVE METABOLIC PANEL - Abnormal; Notable for the following:    Glucose, Bld 102 (*)    Creatinine, Ser 1.48 (*)    AST 55 (*)    GFR calc non Af Amer 60 (*)    GFR calc Af Amer 69 (*)    All other components within normal limits  CK - Abnormal; Notable for the following:    Total CK 245 (*)    All other components within normal limits  URINALYSIS, ROUTINE W REFLEX MICROSCOPIC - Abnormal; Notable for the following:    Color, Urine AMBER (*)     Hgb urine dipstick SMALL (*)    All other components within normal limits  URINE MICROSCOPIC-ADD ON - Abnormal; Notable for the following:    Casts HYALINE CASTS (*)    All other components within normal limits  MAGNESIUM  URINE RAPID DRUG SCREEN (HOSP PERFORMED)  POCT I-STAT TROPONIN I   No results found. 1. Muscle cramps   2. Elevated serum creatinine   3. Dehydration     MDM  Caleb Heath is a 37 y.o. male who presents with muscle cramping. Patient is on hydrochlorothiazide also takes potassium supplementation, patient arrives tachycardic. Denies any chest pain, shortness of breath or syncope. Patient's EKG is unchanged from prior EKG. Patient was given fluids in the emergency department and evaluated for dehydration as well as HUS secondary to 2 loose stool, rhabdomyolysis. Labs are unremarkable, patient does have some dark-colored urine, his creatinine has elevated, suggestive of possible dehydration, he feels much better and has not had cramping since receiving fluids. CK is only mildly elevated to 45, electrolytes and replacing at this time. Secondary to patient's increasing creatinine, I will have him followup with his primary care physician Dr. Laural Benes, patient will drink increased amount of fluids while at work, return to the ER for any concerning symptoms.   Jones Skene, MD 09/30/12 [phone]

## 2012-09-29 NOTE — ED Provider Notes (Signed)
MSE was initiated and I personally evaluated the patient and placed orders (if any) at  10:33 PM on September 29, 2012.  The patient appears stable so that the remainder of the MSE may be completed by another provider.  Patient presents emergency department with chief complaint of diarrhea, muscle cramps, generalized body aches, and tachycardia. 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 He also endorses watery diarrhea. He is been taking potassium pills, but has been unable to tolerate them. Nothing makes his symptoms better or worse.  Patient is tachycardic the upper 120s to low 130s. Will move the patient to the acute side for further evaluation of muscle cramps, diarrhea, and tachycardia. I ordered basic labs, including EKG and CK, which will be reviewed and/or modified by provider continuing care.  Roxy Horseman, PA-C 09/29/12 2236

## 2012-09-29 NOTE — ED Notes (Signed)
PT. REPORTS GENERALIZED BODY ACHES/MUSCLE CRAMPS ( FEET/LEGS/ARMS/BACK) ONSET TODAY , DENIES INJURY OR FALL .

## 2012-09-30 LAB — URINE MICROSCOPIC-ADD ON

## 2012-09-30 LAB — RAPID URINE DRUG SCREEN, HOSP PERFORMED
Amphetamines: NOT DETECTED
Opiates: NOT DETECTED

## 2012-09-30 LAB — COMPREHENSIVE METABOLIC PANEL
ALT: 36 U/L (ref 0–53)
Albumin: 4.2 g/dL (ref 3.5–5.2)
Alkaline Phosphatase: 75 U/L (ref 39–117)
Chloride: 96 mEq/L (ref 96–112)
GFR calc Af Amer: 69 mL/min — ABNORMAL LOW (ref >90)
Glucose, Bld: 102 mg/dL — ABNORMAL HIGH (ref 70–99)
Potassium: 4 mEq/L (ref 3.5–5.1)
Sodium: 135 mEq/L (ref 135–145)
Total Bilirubin: 0.6 mg/dL (ref 0.3–1.2)
Total Protein: 7.4 g/dL (ref 6.0–8.3)

## 2012-09-30 LAB — URINALYSIS, ROUTINE W REFLEX MICROSCOPIC
Glucose, UA: NEGATIVE mg/dL
Leukocytes, UA: NEGATIVE
Nitrite: NEGATIVE
Protein, ur: NEGATIVE mg/dL
Urobilinogen, UA: 1 mg/dL (ref 0.0–1.0)

## 2012-09-30 NOTE — ED Provider Notes (Signed)
Medical screening examination/treatment/procedure(s) were performed by non-physician practitioner and as supervising physician I was immediately available for consultation/collaboration.  Markeita Alicia, MD 09/30/12 0156 

## 2012-12-04 ENCOUNTER — Other Ambulatory Visit: Payer: Self-pay

## 2013-09-04 ENCOUNTER — Emergency Department (HOSPITAL_COMMUNITY)
Admission: EM | Admit: 2013-09-04 | Discharge: 2013-09-04 | Discharge status: Against Medical Advice | Payer: Self-pay | Attending: Emergency Medicine | Admitting: Emergency Medicine

## 2013-09-04 ENCOUNTER — Encounter (HOSPITAL_COMMUNITY): Payer: Self-pay | Admitting: Emergency Medicine

## 2013-09-04 DIAGNOSIS — I10 Essential (primary) hypertension: Secondary | ICD-10-CM | POA: Insufficient documentation

## 2013-09-04 DIAGNOSIS — R1032 Left lower quadrant pain: Secondary | ICD-10-CM | POA: Insufficient documentation

## 2013-09-04 DIAGNOSIS — F17200 Nicotine dependence, unspecified, uncomplicated: Secondary | ICD-10-CM | POA: Insufficient documentation

## 2013-09-04 LAB — CBC WITH DIFFERENTIAL/PLATELET
BASOS ABS: 0 10*3/uL (ref 0.0–0.1)
Basophils Relative: 0 % (ref 0–1)
Eosinophils Absolute: 0.1 10*3/uL (ref 0.0–0.7)
Eosinophils Relative: 2 % (ref 0–5)
HEMATOCRIT: 39.4 % (ref 39.0–52.0)
HEMOGLOBIN: 13.6 g/dL (ref 13.0–17.0)
LYMPHS ABS: 4 10*3/uL (ref 0.7–4.0)
LYMPHS PCT: 54 % — AB (ref 12–46)
MCH: 31.4 pg (ref 26.0–34.0)
MCHC: 34.5 g/dL (ref 30.0–36.0)
MCV: 91 fL (ref 78.0–100.0)
MONO ABS: 0.5 10*3/uL (ref 0.1–1.0)
MONOS PCT: 6 % (ref 3–12)
NEUTROS ABS: 2.8 10*3/uL (ref 1.7–7.7)
Neutrophils Relative %: 38 % — ABNORMAL LOW (ref 43–77)
Platelets: 239 10*3/uL (ref 150–400)
RBC: 4.33 MIL/uL (ref 4.22–5.81)
RDW: 12.8 % (ref 11.5–15.5)
WBC: 7.4 10*3/uL (ref 4.0–10.5)

## 2013-09-04 LAB — COMPREHENSIVE METABOLIC PANEL
ALT: 20 U/L (ref 0–53)
AST: 34 U/L (ref 0–37)
Albumin: 4.4 g/dL (ref 3.5–5.2)
Alkaline Phosphatase: 72 U/L (ref 39–117)
BUN: 7 mg/dL (ref 6–23)
CHLORIDE: 98 meq/L (ref 96–112)
CO2: 20 meq/L (ref 19–32)
CREATININE: 0.74 mg/dL (ref 0.50–1.35)
Calcium: 9 mg/dL (ref 8.4–10.5)
GLUCOSE: 99 mg/dL (ref 70–99)
Potassium: 4.1 mEq/L (ref 3.7–5.3)
Sodium: 137 mEq/L (ref 137–147)
Total Protein: 7.6 g/dL (ref 6.0–8.3)

## 2013-09-04 LAB — LIPASE, BLOOD: Lipase: 50 U/L (ref 11–59)

## 2013-09-04 LAB — URINALYSIS, ROUTINE W REFLEX MICROSCOPIC
Bilirubin Urine: NEGATIVE
GLUCOSE, UA: NEGATIVE mg/dL
Hgb urine dipstick: NEGATIVE
KETONES UR: NEGATIVE mg/dL
LEUKOCYTES UA: NEGATIVE
Nitrite: NEGATIVE
PROTEIN: NEGATIVE mg/dL
Specific Gravity, Urine: 1.007 (ref 1.005–1.030)
UROBILINOGEN UA: 0.2 mg/dL (ref 0.0–1.0)
pH: 5 (ref 5.0–8.0)

## 2013-09-04 NOTE — ED Notes (Addendum)
LLQ pain x 1 hour.  Denies nausea, vomiting, and diarrhea.  Reports he drank 12 pack of beer and tequila tonight. Reports heavy etoh use since he was 38 yrs old.  Pt crying.  Unable to sit still due to pain.

## 2013-09-04 NOTE — ED Notes (Addendum)
Pt called out stating "I just wanna go". This RN to have him sign out AMA.

## 2013-09-04 NOTE — ED Notes (Signed)
Pt crying and very upset.  States he is scared because his Daddy died in 2003 from colon cancer.

## 2013-09-04 NOTE — ED Notes (Addendum)
Pt is upset with the wait, stating "I just want to get home to my kids". He also states that he wants to know what is causing his pain, because he had a family member die after having the same pain. Lavella LemonsManly, MD is aware.

## 2013-09-06 ENCOUNTER — Emergency Department (HOSPITAL_COMMUNITY)
Admission: EM | Admit: 2013-09-06 | Discharge: 2013-09-06 | Disposition: A | Discharge status: Home / Self Care | Payer: Self-pay | Attending: Emergency Medicine | Admitting: Emergency Medicine

## 2013-09-06 ENCOUNTER — Encounter (HOSPITAL_COMMUNITY): Payer: Self-pay | Admitting: Emergency Medicine

## 2013-09-06 DIAGNOSIS — F17200 Nicotine dependence, unspecified, uncomplicated: Secondary | ICD-10-CM | POA: Insufficient documentation

## 2013-09-06 DIAGNOSIS — R109 Unspecified abdominal pain: Secondary | ICD-10-CM

## 2013-09-06 DIAGNOSIS — Z8781 Personal history of (healed) traumatic fracture: Secondary | ICD-10-CM | POA: Insufficient documentation

## 2013-09-06 DIAGNOSIS — R1032 Left lower quadrant pain: Secondary | ICD-10-CM | POA: Insufficient documentation

## 2013-09-06 DIAGNOSIS — Z8659 Personal history of other mental and behavioral disorders: Secondary | ICD-10-CM | POA: Insufficient documentation

## 2013-09-06 LAB — URINALYSIS, ROUTINE W REFLEX MICROSCOPIC
Bilirubin Urine: NEGATIVE
GLUCOSE, UA: NEGATIVE mg/dL
Hgb urine dipstick: NEGATIVE
Ketones, ur: NEGATIVE mg/dL
LEUKOCYTES UA: NEGATIVE
Nitrite: NEGATIVE
PH: 5.5 (ref 5.0–8.0)
PROTEIN: NEGATIVE mg/dL
Specific Gravity, Urine: 1.018 (ref 1.005–1.030)
Urobilinogen, UA: 1 mg/dL (ref 0.0–1.0)

## 2013-09-06 LAB — COMPREHENSIVE METABOLIC PANEL
ALBUMIN: 4.7 g/dL (ref 3.5–5.2)
ALT: 21 U/L (ref 0–53)
AST: 49 U/L — AB (ref 0–37)
Alkaline Phosphatase: 72 U/L (ref 39–117)
BUN: 10 mg/dL (ref 6–23)
CALCIUM: 9.6 mg/dL (ref 8.4–10.5)
CO2: 22 mEq/L (ref 19–32)
CREATININE: 0.68 mg/dL (ref 0.50–1.35)
Chloride: 98 mEq/L (ref 96–112)
GFR calc Af Amer: >90 mL/min (ref >90)
GFR calc non Af Amer: >90 mL/min (ref >90)
Glucose, Bld: 68 mg/dL — ABNORMAL LOW (ref 70–99)
Potassium: 4.4 mEq/L (ref 3.7–5.3)
Sodium: 138 mEq/L (ref 137–147)
TOTAL PROTEIN: 7.8 g/dL (ref 6.0–8.3)
Total Bilirubin: 0.3 mg/dL (ref 0.3–1.2)

## 2013-09-06 LAB — CBC WITH DIFFERENTIAL/PLATELET
BASOS ABS: 0 10*3/uL (ref 0.0–0.1)
BASOS PCT: 1 % (ref 0–1)
EOS PCT: 1 % (ref 0–5)
Eosinophils Absolute: 0.1 10*3/uL (ref 0.0–0.7)
HEMATOCRIT: 41.4 % (ref 39.0–52.0)
Hemoglobin: 14.2 g/dL (ref 13.0–17.0)
Lymphocytes Relative: 33 % (ref 12–46)
Lymphs Abs: 2.8 10*3/uL (ref 0.7–4.0)
MCH: 31.3 pg (ref 26.0–34.0)
MCHC: 34.3 g/dL (ref 30.0–36.0)
MCV: 91.2 fL (ref 78.0–100.0)
MONO ABS: 0.7 10*3/uL (ref 0.1–1.0)
Monocytes Relative: 9 % (ref 3–12)
NEUTROS ABS: 4.7 10*3/uL (ref 1.7–7.7)
Neutrophils Relative %: 56 % (ref 43–77)
Platelets: 261 10*3/uL (ref 150–400)
RBC: 4.54 MIL/uL (ref 4.22–5.81)
RDW: 13 % (ref 11.5–15.5)
WBC: 8.4 10*3/uL (ref 4.0–10.5)

## 2013-09-06 LAB — LIPASE, BLOOD: Lipase: 38 U/L (ref 11–59)

## 2013-09-06 MED ORDER — HYDROCHLOROTHIAZIDE 25 MG PO TABS: 25.0000 mg | ORAL_TABLET | Freq: Every day | ORAL | Status: DC

## 2013-09-06 NOTE — ED Notes (Addendum)
Patient c/o LLQ abdominal pain, [facility] for [facility] same, left AMA s/t fear of being in hospital. Denies n/v/d. Pt has HTN, does not have medication for HTN.

## 2013-09-06 NOTE — Progress Notes (Signed)
[facility] CL provided pt with a list of primary care resources, [facility] Orange Card application, and ACA information.  °

## 2013-09-06 NOTE — ED Notes (Signed)
Bed: [facility] Expected date:  Expected time:  Means of arrival:  Comments: 

## 2013-09-06 NOTE — Discharge Instructions (Signed)
Abdominal Pain, Adult Take Tylenol as directed for pain. Tylenol is safer than ibuprofen in a person who has high blood pressure. You have been written a prescription for blood pressure medication. Call the wellness Center tomorrow to get the next available appointment to care for your blood pressure and other health problems. Or you can call any of the numbers on the resource guide to get a primary care physician. Your blood pressure recheck within the next 2 or 3 weeks at your doctor's office or at an urgent care Center Many things can cause abdominal pain. Usually, abdominal pain is not caused by a disease and will improve without treatment. It can often be observed and treated at home. Your health care provider will do a physical exam and possibly order blood tests and X-rays to help determine the seriousness of your pain. However, in many cases, more time must pass before a clear cause of the pain can be found. Before that point, your health care provider may not know if you need more testing or further treatment. HOME CARE INSTRUCTIONS  Monitor your abdominal pain for any changes. The following actions may help to alleviate any discomfort you are experiencing:  Only take over-the-counter or prescription medicines as directed by your health care provider.  Do not take laxatives unless directed to do so by your health care provider.  Try a clear liquid diet (broth, tea, or water) as directed by your health care provider. Slowly move to a bland diet as tolerated. SEEK MEDICAL CARE IF:  You have unexplained abdominal pain.  You have abdominal pain associated with nausea or diarrhea.  You have pain when you urinate or have a bowel movement.  You experience abdominal pain that wakes you in the night.  You have abdominal pain that is worsened or improved by eating food.  You have abdominal pain that is worsened with eating fatty foods. SEEK IMMEDIATE MEDICAL CARE IF:   Your pain does not go  away within 2 hours.  You have a fever.  You keep throwing up (vomiting).  Your pain is felt only in portions of the abdomen, such as the right side or the left lower portion of the abdomen.  You pass bloody or black tarry stools. MAKE SURE YOU:  Understand these instructions.   Will watch your condition.   Will get help right away if you are not doing well or get worse.  Document Released: 12/19/2004 Document Revised: 12/30/2012 Document Reviewed: 11/18/2012 [facility] Patient Information 2014 [employer] [place].  Emergency Department Resource Guide 1) Find a Doctor and Pay Out of Pocket Although you won't have to find out who is covered by your insurance plan, it is a good idea to ask around and get recommendations. You will then need to call the office and see if the doctor you have chosen will accept you as a new patient and what types of options they offer for patients who are self-pay. Some doctors offer discounts or will set up payment plans for their patients who do not have insurance, but you will need to ask so you aren't surprised when you get to your appointment.  2) Contact Your Local Health Department Not all health departments have doctors that can see patients for sick visits, but many do, so it is worth a call to see if yours does. If you don't know where your local health department is, you can check in your phone book. The CDC also has a tool to help you locate  your state's health department, and many state websites also have listings of all of their local health departments.  3) Find a [facility] If your illness is not likely to be very severe or complicated, you may want to try a walk in clinic. These are popping up all over the country in pharmacies, drugstores, and shopping centers. They're usually staffed by nurse practitioners or physician assistants that have been trained to treat common illnesses and complaints. They're usually fairly quick and inexpensive.  However, if you have serious medical issues or chronic medical problems, these are probably not your best option.  No Primary Care Doctor: - Call Health Connect at  [phone] - they can help you locate a primary care doctor that  accepts your insurance, provides certain services, etc. - Physician Referral Service- [phone]  Chronic Pain Problems: Organization         Address  Phone   Notes  [facility]  [phone] Patients need to be referred by their primary care doctor.   Medication Assistance: Organization         Address  Phone   Notes  [facility] Medication [facility] [address] [place], [place] [address] [phone] --Must be a resident of [facility] -- Must have NO insurance coverage whatsoever (no Medicaid/ Medicare, etc.) -- The pt. MUST have a primary care doctor that directs their care regularly and follows them in the community   [facility]ist  [phone]   [employer]  [phone]    Agencies that provide inexpensive medical care: Organization         Address  Phone   Notes  [place]  [phone]   [facility]    [phone]   [facility] [place], [place] [address] [phone]   [place] [address], [place] [phone]   [facility]anned Parenthood    [phone]   [facility]    [phone]   [place] [address], [place] Phone:  [phone], Fax:  [phone] Hours of Operation:  9 am - 6 pm, M-F.  Also accepts Medicaid/Medicare and self-pay.  [facility] for [address] [place], [address], [place] Phone: [phone], Fax: [phone]. Hours of Operation:  8:30 am - 5:30 pm, M-F.  Also accepts Medicaid and self-pay.  [facility] [place] [address], [place] Phone: [phone]   [place], [place], [place] [phone], Ext. [phone] Mondays & Thursdays: 7-9 AM.  First 15 patients are seen on a first come, first serve basis.    [place] Providers:  Organization         Address  Phone   Notes  [facility] [address], [place] [phone] Also accepts self-pay patients.  [facility] [address], [place]  [phone]   [place], [address], [place] [phone]   [facility] [address] [place] [phone]   Lucianne Lei [address], [place]   [phone] Only accepts [place] patients after they have their name applied to their card.   Self-Pay (no insurance) in [facility]:  Organization  Address  Phone   Notes  °Sickle Cell Patients, [facility] [address], [place] [phone]   °[facility] [address], [place] [phone]) [phone]   °[facility] ° [address], [place] [phone]   °[facility]/Dr. Osei-Bonsu ° [address] [place] [address], [place] or [address], [place] [phone] Phone number for both [place] and [place] locations is the same.  °[facility] [address], [place] [phone]   °[facility] [address] [place] [address], [place] or [address] ([phone]) [phone] °([phone]) [phone]   °[facility] [address], [place] ([phone]) [phone], phone; [phone], fax Sees patients 1st and 3rd Saturday of every month.  Must not qualify for public or private insurance (i.e. Medicaid, Medicare, [place] Health Choice, Veterans' Benefits) • Household income should be no more than 200% of the poverty level •The clinic cannot treat you if you are pregnant or think  you are pregnant • Sexually transmitted diseases are not treated at the clinic.  ° ° °Dental Care: °Organization         Address  Phone  Notes  °[facility] [address], [place] ([phone]) [phone] Accepts children up to age 21 who are enrolled in Medicaid or [place] Health Choice; pregnant women with a Medicaid card; and children who have applied for Medicaid or [place] Health Choice, but were declined, whose parents can pay a reduced fee at time of service.  °[facility] [place]  [address], [place] ([phone]) [phone] Accepts children up to age 21 who are enrolled in Medicaid or [place] Health Choice; pregnant women with a Medicaid card; and children who have applied for Medicaid or [place] Health Choice, but were declined, whose parents can pay a reduced fee at time of service.  °[facility] ° [address], [place] [phone]) [phone] Patients are seen by appointment only. Walk-ins are not accepted. [facility] will see patients 18 years of age and older. °Monday - Tuesday (8am-5pm) °Most Wednesdays (8:30-5pm) °$30 per visit, cash only  °[facility] ° [address], [place] [phone]) [phone] Patients are seen by appointment only. Walk-ins are not accepted. [facility] will see patients 18 years of age and older. °One Wednesday Evening (Monthly: Volunteer Based).  $30 per visit, cash only  °[facility]  ([phone] for adults; Children under age 4, call Graduate Pediatric Dentistry at [phone]. Children aged 4-14, please call [phone] to request a pediatric application. ° Dental services are provided in all areas of dental care including fillings, crowns and bridges, complete and partial dentures, implants, gum treatment, root canals, and extractions. Preventive care is also provided. Treatment is provided to both adults and  children. °Patients are selected via a lottery and there is often a waiting list. °  °[facility] [address], °[place] ° ([phone]) 763-8833 [email or web address] °  °[facility] [address], [place], [place] ([phone], Ext. 123 Second and Fourth Thursday of each month, opens at 6:30 AM; Clinic ends at 9 AM.  Patients are seen on a first-come first-served basis, and a limited number are seen during each clinic.  ° °[facility] ° [address], [place], [place] [phone]) [phone]   Eligibility Requirements °You must have lived in [place], [place], or [place] counties for at least the last three months. °  You cannot be eligible for state or federal sponsored healthcare insurance, including Veterans Administration, Medicaid, or Medicare. °  You generally cannot be eligible for healthcare insurance through your employer.  °  How to apply: °Eligibility screenings are held every Tuesday and Wednesday afternoon from 1:00 pm until 4:00 pm. You do not need an appointment for the interview!  °[facility] [address], [place], [place] [phone]   °[facility]  [phone]   °[facility]  336-703-3100   °[facility]  336-570-6415   ° °Behavioral Health Resources in the Community: °Intensive Outpatient Programs °Organization         Address  Phone  Notes  °[facility] [address], [place], [place] [phone]   °[facility] [address], [place], [place] [phone]   °[facility] [address], [place], [place] ° 336-882-2125   °[facility] [address],  °[place], [place] 1-800-853-5163 or 336-641-4981   °Substance Abuse Resources °Organization         Address  Phone  Notes  °[facility]  336-882-2125   °[facility]  336-784-9470   °[facility]  336-285-9073     °[facility]  336-845-3988   °Residential & Outpatient Substance Abuse Program  1-800-659-3381   °Psychological Services °Organization         Address  Phone  Notes  °[facility]  336- 832-9600   °[facility]  336- 378-7881   °[facility] [address], [place] 1-800-853-5163 or 336-641-4981   ° °Mobile Crisis Teams °Organization         Address  Phone  Notes  °[facility], Mobile Crisis Care Unit  1-877-626-1772   °Assertive °[facility] ° [address] [place], [place] 336-834-9664   °Sharon DeEsch [address] °[place] 336-554-5454   ° °Self-Help/Support Groups °Organization         Address  Phone             Notes  °[facility] [place] - variety of support groups  336- 373-1402 Call for more information  °Narcotics Anonymous (NA), [facility] [address], °[place]  2 meetings at this location  ° °Residential Treatment Programs °Organization         Address  Phone  Notes  °[facility] [address],    °[place]  1-866-801-8205   °[facility] ° [address], [place], [place] 704-293-8524   °[facility] [address], [place] 336-845-3988 Admissions: 8am-3pm M-F  °[facility] [address]    °[place], [place] 336-841-1104   °[facility] [address], [place], [place] 336-379-7146   °[facility] [address]  °[place], [place] 336-285-9073   °[facility] - Intensive Outpatient [address], [place], [place] 336-852-3033   °[facility] ([facility] [address]  °[place], [place] 1-877-615-2722 or 336-784-9470   °[facility] ([facility]) [address] [place], [place] 336-227-7417 Accepts Medicaid  °[facility] [address]  °[place] 1-800-659-3381 Substance Abuse/Addiction Treatment  ° °[place]  Behavioral Health Resources °  Organization         Address  Phone  Notes  [facility]  (320) [phone](888) [phone]   Angie FavaJulie Brannon, PhD [address], [address] [place], [place]   ([phone](336) [phone] or [phone](336) [phone]   [facility]   [address] [place], [place] [phone](336) [phone]   [facility] [address], [place], [place] [phone](336) [phone] Insurance/Medicaid/sponsorship through [facility]  [facility] [address]                                    [place], [place] [phone](336) [phone] Therapy/tele-psych/case  [facility] [address]   [place], [place] [phone](336) [phone]    Dr. Lolly MustacheArfeen  [phone]) [phone]   [facility]  [employer] [facility] 1) [address], [place] 2) [address], [place] 3)  [address], [place] [phone]) [phone](336) [phone]  [phone](336) [phone]   [facility] Child Abuse Hotline [phone](336) 214-756-1372 or [phone](336) [phone] (After Hours)

## 2013-09-06 NOTE — ED Notes (Signed)
Pt escorted to discharge window. Pt verbalized understanding discharge instructions. In no acute distress.  

## 2013-09-06 NOTE — ED Provider Notes (Signed)
CSN: [id number]     Arrival date & time 09/06/13  1343 History   First MD Initiated Contact with Patient 09/06/13 1451     Chief Complaint  Patient presents with  . Abdominal Pain     (Consider location/radiation/quality/duration/timing/severity/associated sxs/prior Treatment) HPI Left-sided abdominal pain onset evening of 09/03/2013. Pain is constant made worse with changing positions improved with remaining still or sleeping. Treated with ibuprofen with partial relief. No appetite change. Last bowel movement this morning, normal no fever no urinary symptoms no other associated symptoms. Patient admits to drinking more alcohol than normal this past weekeend Past Medical History  Diagnosis Date  . Fx wrist   . Hypertension   . Anxiety    History reviewed. No pertinent past surgical history. History reviewed. No pertinent family history. History  Substance Use Topics  . Smoking status: Current Every Day Smoker  . Smokeless tobacco: Not on file  . Alcohol Use: Yes    Review of Systems  Constitutional: Negative.   HENT: Negative.   Respiratory: Negative.   Cardiovascular: Negative.   Gastrointestinal: Positive for abdominal pain.  Musculoskeletal: Negative.   Skin: Negative.   Neurological: Negative.   Psychiatric/Behavioral: Negative.   All other systems reviewed and are negative.     Allergies  Review of patient's allergies indicates no known allergies.  Home Medications   Prior to Admission medications   Medication Sig Start Date End Date Taking? Authorizing Provider  ibuprofen (ADVIL,MOTRIN) 200 MG tablet Take 200 mg by mouth every 6 (six) hours as needed.   Yes Historical Provider, MD   BP 163/100  Pulse 105  Temp(Src) 98.5 F (36.9 C) (Oral)  Resp 16  SpO2 100% Physical Exam  Nursing note and vitals reviewed. Constitutional: He appears well-developed and well-nourished.  HENT:  Head: Normocephalic and atraumatic.  Eyes: Conjunctivae are normal. Pupils  are equal, round, and reactive to light.  Neck: Neck supple. No tracheal deviation present. No thyromegaly present.  Cardiovascular: Normal rate and regular rhythm.   No murmur heard. Pulmonary/Chest: Effort normal and breath sounds normal.  Abdominal: Soft. Bowel sounds are normal. He exhibits no distension and no mass. There is tenderness. There is no guarding.  Mildly tender left lower quadrant  Genitourinary: Penis normal.  Normal male genitalia.no masses or signs of hernia  Musculoskeletal: Normal range of motion. He exhibits no edema and no tenderness.  Neurological: He is alert. Coordination normal.  Skin: Skin is warm and dry. No rash noted.  Psychiatric: He has a normal mood and affect.    ED Course  Procedures (including critical care time) Labs Review Labs Reviewed  CBC WITH DIFFERENTIAL  COMPREHENSIVE METABOLIC PANEL  LIPASE, BLOOD  URINALYSIS, ROUTINE W REFLEX MICROSCOPIC    Imaging Review No results found.   EKG Interpretation None     Results for orders placed during the hospital encounter of 09/06/13  CBC WITH DIFFERENTIAL      Result Value Ref Range   WBC 8.4  4.0 - 10.5 K/uL   RBC 4.54  4.22 - 5.81 MIL/uL   Hemoglobin 14.2  13.0 - 17.0 g/dL   HCT 40.941.4  81.139.0 - 91.452.0 %   MCV 91.2  78.0 - 100.0 fL   MCH 31.3  26.0 - 34.0 pg   MCHC 34.3  30.0 - 36.0 g/dL   RDW 78.213.0  95.611.5 - 21.315.5 %   Platelets 261  150 - 400 K/uL   Neutrophils Relative % 56  43 - 77 %  Neutro Abs 4.7  1.7 - 7.7 K/uL   Lymphocytes Relative 33  12 - 46 %   Lymphs Abs 2.8  0.7 - 4.0 K/uL   Monocytes Relative 9  3 - 12 %   Monocytes Absolute 0.7  0.1 - 1.0 K/uL   Eosinophils Relative 1  0 - 5 %   Eosinophils Absolute 0.1  0.0 - 0.7 K/uL   Basophils Relative 1  0 - 1 %   Basophils Absolute 0.0  0.0 - 0.1 K/uL  COMPREHENSIVE METABOLIC PANEL      Result Value Ref Range   Sodium 138  137 - 147 mEq/L   Potassium 4.4  3.7 - 5.3 mEq/L   Chloride 98  96 - 112 mEq/L   CO2 22  19 - 32 mEq/L    Glucose, Bld 68 (*) 70 - 99 mg/dL   BUN 10  6 - 23 mg/dL   Creatinine, Ser 4.090.68  0.50 - 1.35 mg/dL   Calcium 9.6  8.4 - 81.110.5 mg/dL   Total Protein 7.8  6.0 - 8.3 g/dL   Albumin 4.7  3.5 - 5.2 g/dL   AST 49 (*) 0 - 37 U/L   ALT 21  0 - 53 U/L   Alkaline Phosphatase 72  39 - 117 U/L   Total Bilirubin 0.3  0.3 - 1.2 mg/dL   GFR calc non Af Amer >90  >90 mL/min   GFR calc Af Amer >90  >90 mL/min  LIPASE, BLOOD      Result Value Ref Range   Lipase 38  11 - 59 U/L  URINALYSIS, ROUTINE W REFLEX MICROSCOPIC      Result Value Ref Range   Color, Urine YELLOW  YELLOW   APPearance CLEAR  CLEAR   Specific Gravity, Urine 1.018  1.005 - 1.030   pH 5.5  5.0 - 8.0   Glucose, UA NEGATIVE  NEGATIVE mg/dL   Hgb urine dipstick NEGATIVE  NEGATIVE   Bilirubin Urine NEGATIVE  NEGATIVE   Ketones, ur NEGATIVE  NEGATIVE mg/dL   Protein, ur NEGATIVE  NEGATIVE mg/dL   Urobilinogen, UA 1.0  0.0 - 1.0 mg/dL   Nitrite NEGATIVE  NEGATIVE   Leukocytes, UA NEGATIVE  NEGATIVE   No results found.  MDM   Patient last ibuprofen approximately 24 hours ago. Pain continues to improve steadily today. We'll not image in light of back pain improving steadily without treatment. Plan Tylenol for pain. Return as needed. Prescription HCTZ. He's been written for antihypertensives in the past has  andrun out of medicine. Referral wellness Center in resource guide to get a primary care physician. Blood pressure recheck 2 or 3 weeks Final diagnoses:  None   diagnosis #1 abdominal pain, nonspecific #2 hypertension      Doug SouSam Demarea Lorey, MD 09/06/13 1545

## 2013-09-27 ENCOUNTER — Ambulatory Visit: Payer: Self-pay | Admitting: Internal Medicine

## 2013-10-19 ENCOUNTER — Ambulatory Visit: Payer: Self-pay | Attending: Internal Medicine | Admitting: Internal Medicine

## 2013-10-19 ENCOUNTER — Encounter: Payer: Self-pay | Admitting: Internal Medicine

## 2013-10-19 VITALS — BP 155/94 | HR 96 | Temp 98.8°F | Resp 18 | Ht 74.0 in | Wt 171.6 lb

## 2013-10-19 DIAGNOSIS — F17200 Nicotine dependence, unspecified, uncomplicated: Secondary | ICD-10-CM | POA: Insufficient documentation

## 2013-10-19 DIAGNOSIS — I10 Essential (primary) hypertension: Secondary | ICD-10-CM | POA: Insufficient documentation

## 2013-10-19 MED ORDER — AMLODIPINE BESYLATE 5 MG PO TABS: 5.0000 mg | ORAL_TABLET | Freq: Every day | ORAL | Status: DC

## 2013-10-19 NOTE — Patient Instructions (Signed)
DASH Eating Plan DASH stands for "Dietary Approaches to Stop Hypertension." The DASH eating plan is a healthy eating plan that has been shown to reduce high blood pressure (hypertension). Additional health benefits may include reducing the risk of type 2 diabetes mellitus, heart disease, and stroke. The DASH eating plan may also help with weight loss. WHAT DO I NEED TO KNOW ABOUT THE DASH EATING PLAN? For the DASH eating plan, you will follow these general guidelines:  Choose foods with a percent daily value for sodium of less than 5% (as listed on the food label).  Use salt-free seasonings or herbs instead of table salt or sea salt.  Check with your health care provider or pharmacist before using salt substitutes.  Eat lower-sodium products, often labeled as "lower sodium" or "no salt added."  Eat fresh foods.  Eat more vegetables, fruits, and low-fat dairy products.  Choose whole grains. Look for the word "whole" as the first word in the ingredient list.  Choose fish and skinless chicken or turkey more often than red meat. Limit fish, poultry, and meat to 6 oz (170 g) each day.  Limit sweets, desserts, sugars, and sugary drinks.  Choose heart-healthy fats.  Limit cheese to 1 oz (28 g) per day.  Eat more home-cooked food and less restaurant, buffet, and fast food.  Limit fried foods.  Cook foods using methods other than frying.  Limit canned vegetables. If you do use them, rinse them well to decrease the sodium.  When eating at a restaurant, ask that your food be prepared with less salt, or no salt if possible. WHAT FOODS CAN I EAT? Seek help from a dietitian for individual calorie needs. Grains Whole grain or whole wheat bread. Brown rice. Whole grain or whole wheat pasta. Quinoa, bulgur, and whole grain cereals. Low-sodium cereals. Corn or whole wheat flour tortillas. Whole grain cornbread. Whole grain crackers. Low-sodium crackers. Vegetables Fresh or frozen vegetables  (raw, steamed, roasted, or grilled). Low-sodium or reduced-sodium tomato and vegetable juices. Low-sodium or reduced-sodium tomato sauce and paste. Low-sodium or reduced-sodium canned vegetables.  Fruits All fresh, canned (in natural juice), or frozen fruits. Meat and Other Protein Products Ground beef (85% or leaner), grass-fed beef, or beef trimmed of fat. Skinless chicken or turkey. Ground chicken or turkey. Pork trimmed of fat. All fish and seafood. Eggs. Dried beans, peas, or lentils. Unsalted nuts and seeds. Unsalted canned beans. Dairy Low-fat dairy products, such as skim or 1% milk, 2% or reduced-fat cheeses, low-fat ricotta or cottage cheese, or plain low-fat yogurt. Low-sodium or reduced-sodium cheeses. Fats and Oils Tub margarines without trans fats. Light or reduced-fat mayonnaise and salad dressings (reduced sodium). Avocado. Safflower, olive, or canola oils. Natural peanut or almond butter. Other Unsalted popcorn and pretzels. The items listed above may not be a complete list of recommended foods or beverages. Contact your dietitian for more options. WHAT FOODS ARE NOT RECOMMENDED? Grains White bread. White pasta. White rice. Refined cornbread. Bagels and croissants. Crackers that contain trans fat. Vegetables Creamed or fried vegetables. Vegetables in a cheese sauce. Regular canned vegetables. Regular canned tomato sauce and paste. Regular tomato and vegetable juices. Fruits Dried fruits. Canned fruit in light or heavy syrup. Fruit juice. Meat and Other Protein Products Fatty cuts of meat. Ribs, chicken wings, bacon, sausage, bologna, salami, chitterlings, fatback, hot dogs, bratwurst, and packaged luncheon meats. Salted nuts and seeds. Canned beans with salt. Dairy Whole or 2% milk, cream, half-and-half, and cream cheese. Whole-fat or sweetened yogurt. Full-fat   cheeses or blue cheese. Nondairy creamers and whipped toppings. Processed cheese, cheese spreads, or cheese  curds. Condiments Onion and garlic salt, seasoned salt, table salt, and sea salt. Canned and packaged gravies. Worcestershire sauce. Tartar sauce. Barbecue sauce. Teriyaki sauce. Soy sauce, including reduced sodium. Steak sauce. Fish sauce. Oyster sauce. Cocktail sauce. Horseradish. Ketchup and mustard. Meat flavorings and tenderizers. Bouillon cubes. Hot sauce. Tabasco sauce. Marinades. Taco seasonings. Relishes. Fats and Oils Butter, stick margarine, lard, shortening, ghee, and bacon fat. Coconut, palm kernel, or palm oils. Regular salad dressings. Other Pickles and olives. Salted popcorn and pretzels. The items listed above may not be a complete list of foods and beverages to avoid. Contact your dietitian for more information. WHERE CAN I FIND MORE INFORMATION? National Heart, Lung, and Blood Institute: www.nhlbi.nih.gov/health/health-topics/topics/dash/ Document Released: 02/28/2011 Document Revised: 07/26/2013 Document Reviewed: 01/13/2013 ExitCare Patient Information 2015 [employer]. This information is not intended to replace advice given to you by your health care provider. Make sure you discuss any questions you have with your health care provider. Smoking Cessation Quitting smoking is important to your health and has many advantages. However, it is not always easy to quit since nicotine is a very addictive drug. Oftentimes, people try 3 times or more before being able to quit. This document explains the best ways for you to prepare to quit smoking. Quitting takes hard work and a lot of effort, but you can do it. ADVANTAGES OF QUITTING SMOKING  You will live longer, feel better, and live better.  Your body will feel the impact of quitting smoking almost immediately.  Within 20 minutes, blood pressure decreases. Your pulse returns to its normal level.  After 8 hours, carbon monoxide levels in the blood return to normal. Your oxygen level increases.  After 24 hours, the chance of  having a heart attack starts to decrease. Your breath, hair, and body stop smelling like smoke.  After 48 hours, damaged nerve endings begin to recover. Your sense of taste and smell improve.  After 72 hours, the body is virtually free of nicotine. Your bronchial tubes relax and breathing becomes easier.  After 2 to 12 weeks, lungs can hold more air. Exercise becomes easier and circulation improves.  The risk of having a heart attack, stroke, cancer, or lung disease is greatly reduced.  After 1 year, the risk of coronary heart disease is cut in half.  After 5 years, the risk of stroke falls to the same as a nonsmoker.  After 10 years, the risk of lung cancer is cut in half and the risk of other cancers decreases significantly.  After 15 years, the risk of coronary heart disease drops, usually to the level of a nonsmoker.  If you are pregnant, quitting smoking will improve your chances of having a healthy baby.  The people you live with, especially any children, will be healthier.  You will have extra money to spend on things other than cigarettes. QUESTIONS TO THINK ABOUT BEFORE ATTEMPTING TO QUIT You may want to talk about your answers with your health care provider.  Why do you want to quit?  If you tried to quit in the past, what helped and what did not?  What will be the most difficult situations for you after you quit? How will you plan to handle them?  Who can help you through the tough times? Your family? Friends? A health care provider?  What pleasures do you get from smoking? What ways can you still get pleasure   if you quit? Here are some questions to ask your health care provider:  How can you help me to be successful at quitting?  What medicine do you think would be best for me and how should I take it?  What should I do if I need more help?  What is smoking withdrawal like? How can I get information on withdrawal? GET READY  Set a quit date.  Change your  environment by getting rid of all cigarettes, ashtrays, matches, and lighters in your home, car, or work. Do not let people smoke in your home.  Review your past attempts to quit. Think about what worked and what did not. GET SUPPORT AND ENCOURAGEMENT You have a better chance of being successful if you have help. You can get support in many ways.  Tell your family, friends, and coworkers that you are going to quit and need their support. Ask them not to smoke around you.  Get individual, group, or telephone counseling and support. Programs are available at local hospitals and health centers. Call your local health department for information about programs in your area.  Spiritual beliefs and practices may help some smokers quit.  Download a "quit meter" on your computer to keep track of quit statistics, such as how long you have gone without smoking, cigarettes not smoked, and money saved.  Get a self-help book about quitting smoking and staying off tobacco. LEARN NEW SKILLS AND BEHAVIORS  Distract yourself from urges to smoke. Talk to someone, go for a walk, or occupy your time with a task.  Change your normal routine. Take a different route to work. Drink tea instead of coffee. Eat breakfast in a different place.  Reduce your stress. Take a hot bath, exercise, or read a book.  Plan something enjoyable to do every day. Reward yourself for not smoking.  Explore interactive web-based programs that specialize in helping you quit. GET MEDICINE AND USE IT CORRECTLY Medicines can help you stop smoking and decrease the urge to smoke. Combining medicine with the above behavioral methods and support can greatly increase your chances of successfully quitting smoking.  Nicotine replacement therapy helps deliver nicotine to your body without the negative effects and risks of smoking. Nicotine replacement therapy includes nicotine gum, lozenges, inhalers, nasal sprays, and skin patches. Some may be  available over-the-counter and others require a prescription.  Antidepressant medicine helps people abstain from smoking, but how this works is unknown. This medicine is available by prescription.  Nicotinic receptor partial agonist medicine simulates the effect of nicotine in your brain. This medicine is available by prescription. Ask your health care provider for advice about which medicines to use and how to use them based on your health history. Your health care provider will tell you what side effects to look out for if you choose to be on a medicine or therapy. Carefully read the information on the package. Do not use any other product containing nicotine while using a nicotine replacement product.  RELAPSE OR DIFFICULT SITUATIONS Most relapses occur within the first 3 months after quitting. Do not be discouraged if you start smoking again. Remember, most people try several times before finally quitting. You may have symptoms of withdrawal because your body is used to nicotine. You may crave cigarettes, be irritable, feel very hungry, cough often, get headaches, or have difficulty concentrating. The withdrawal symptoms are only temporary. They are strongest when you first quit, but they will go away within 10-14 days. To reduce the   chances of relapse, try to:  Avoid drinking alcohol. Drinking lowers your chances of successfully quitting.  Reduce the amount of caffeine you consume. Once you quit smoking, the amount of caffeine in your body increases and can give you symptoms, such as a rapid heartbeat, sweating, and anxiety.  Avoid smokers because they can make you want to smoke.  Do not let weight gain distract you. Many smokers will gain weight when they quit, usually less than 10 pounds. Eat a healthy diet and stay active. You can always lose the weight gained after you quit.  Find ways to improve your mood other than smoking. FOR MORE INFORMATION  www.smokefree.gov  Document Released:  03/05/2001 Document Revised: 07/26/2013 Document Reviewed: 06/20/2011 ExitCare Patient Information 2015 [employer]. This information is not intended to replace advice given to you by your health care provider. Make sure you discuss any questions you have with your health care provider.  

## 2013-10-19 NOTE — Progress Notes (Signed)
Patient presents for f/u on HTN States ran out of HZTZ 6 months ago. States he had a headache for 3 days last week that he felt was due to elevated BP but does not monitor. Denies headache at present  Would like to quit smoking

## 2013-10-19 NOTE — Progress Notes (Signed)
Patient ID: Caleb Heath, male   DOB: 12/11/1975, 38 y.o.   MRN: [id number]  CC:  HTN  HPI:  Patient reports that he was diagnosed with hypertension a while back and has been out of his medication for over 6 months.  He reports a headache for three days and believes that it is related to his blood pressure.  Patient's girlfriend states that he randomly feels like he has numbness in his arms.    No Known Allergies Past Medical History  Diagnosis Date  . Fx wrist   . Hypertension   . Anxiety    Current Outpatient Prescriptions on File Prior to Visit  Medication Sig Dispense Refill  . hydrochlorothiazide (HYDRODIURIL) 25 MG tablet Take 1 tablet (25 mg total) by mouth daily.  30 tablet  0  . ibuprofen (ADVIL,MOTRIN) 200 MG tablet Take 200 mg by mouth every 6 (six) hours as needed.       No current facility-administered medications on file prior to visit.   Family History  Problem Relation Age of Onset  . Hypertension Mother   . Cancer Father 6450    colon   History   Social History  . Marital Status: Single    Spouse Name: N/A    Number of Children: N/A  . Years of Education: N/A   Occupational History  . Not on file.   Social History Main Topics  . Smoking status: Current Every Day Smoker  . Smokeless tobacco: Not on file  . Alcohol Use: Yes  . Drug Use: No  . Sexual Activity: Not on file   Other Topics Concern  . Not on file   Social History Narrative  . No narrative on file   Review of Systems  Constitutional: Negative.   Eyes: Negative.   Respiratory: Negative.   Cardiovascular: Negative.   Genitourinary: Negative.   Musculoskeletal: Negative.   Neurological: Positive for dizziness (works in hot sun), tingling (in arms randomly) and headaches.     Objective:   Filed Vitals:   10/19/13 1703  BP: 155/94  Pulse: 96  Temp: 98.8 F (37.1 C)  Resp: 18    Physical Exam: Constitutional: Patient appears well-developed and well-nourished. No  distress. HENT: Normocephalic, atraumatic, External right and left ear normal. Oropharynx is clear and moist.  Eyes: Conjunctivae and EOM are normal. PERRLA, no scleral icterus. Neck: Normal ROM. Neck supple. No JVD. No tracheal deviation. No thyromegaly. CVS: RRR, S1/S2 +, no murmurs, no gallops, no carotid bruit.  Pulmonary: Effort and breath sounds normal, no stridor, rhonchi, wheezes, rales.  Abdominal: Soft. BS +,  no distension, tenderness, rebound or guarding.  Musculoskeletal: Normal range of motion. No edema and no tenderness.  Lymphadenopathy: No lymphadenopathy noted, cervical Neuro: Alert. Normal reflexes, muscle tone coordination. No cranial nerve deficit. Skin: Skin is warm and dry. No rash noted. Not diaphoretic. No erythema. No pallor. Psychiatric: Normal mood and affect. Behavior, judgment, thought content normal.  Lab Results  Component Value Date   WBC 8.4 09/06/2013   HGB 14.2 09/06/2013   HCT 41.4 09/06/2013   MCV 91.2 09/06/2013   PLT 261 09/06/2013   Lab Results  Component Value Date   CREATININE 0.68 09/06/2013   BUN 10 09/06/2013   NA 138 09/06/2013   K 4.4 09/06/2013   CL 98 09/06/2013   CO2 22 09/06/2013    No results found for this basename: HGBA1C   Lipid Panel  No results found for this basename: chol, trig, hdl, cholhdl,  vldl, ldlcalc       Assessment and plan:   Caleb Heath was seen today for follow-up and hypertension.  Diagnoses and associated orders for this visit:  Essential hypertension - amLODipine (NORVASC) 5 MG tablet; Take 1 tablet (5 mg total) by mouth daily.  Discontinued HCTZ because patient works outside and in the sun daily. Do not want to chance risk of dehydration Lipid panel; Future  Tobacco use disorder Smoking cessation discussed. Patient will try nicotine patches and call back if he decides to try wellbutrin.   Return in about 2 weeks (around 11/02/2013) for Nurse Visit-BP check and lab, 3 mo PCP.       Holland Commons,  NP-C [facility] [phone] 10/19/2013, 5:21 PM

## 2013-10-26 ENCOUNTER — Ambulatory Visit: Payer: Self-pay | Attending: Internal Medicine

## 2013-10-26 ENCOUNTER — Ambulatory Visit: Payer: Self-pay

## 2013-10-26 DIAGNOSIS — I10 Essential (primary) hypertension: Secondary | ICD-10-CM

## 2013-10-27 ENCOUNTER — Telehealth: Payer: Self-pay | Admitting: Emergency Medicine

## 2013-10-27 LAB — LIPID PANEL
CHOL/HDL RATIO: 1.9 ratio
CHOLESTEROL: 180 mg/dL (ref 0–200)
HDL: 97 mg/dL (ref >39)
LDL Cholesterol: 75 mg/dL (ref 0–99)
Triglycerides: 42 mg/dL (ref <150)
VLDL: 8 mg/dL (ref 0–40)

## 2013-10-27 NOTE — Telephone Encounter (Signed)
Pt given negative cholesterol blood work

## 2013-10-27 NOTE — Telephone Encounter (Signed)
Left message for pt to call for lab results 

## 2013-10-27 NOTE — Telephone Encounter (Signed)
Message copied by Darlis LoanSMITH, JILL D on Wed Oct 27, 2013  5:55 PM ------      Message from: Holland CommonsKECK, VALERIE A      Created: Wed Oct 27, 2013  3:51 PM       Cholesterol is normal. ------

## 2013-11-08 ENCOUNTER — Emergency Department (INDEPENDENT_AMBULATORY_CARE_PROVIDER_SITE_OTHER)
Admission: EM | Admit: 2013-11-08 | Discharge: 2013-11-08 | Disposition: A | Discharge status: Home / Self Care | Payer: Self-pay | Source: Home / Self Care | Attending: Family Medicine | Admitting: Family Medicine

## 2013-11-08 ENCOUNTER — Encounter (HOSPITAL_COMMUNITY): Payer: Self-pay | Admitting: Emergency Medicine

## 2013-11-08 DIAGNOSIS — H00019 Hordeolum externum unspecified eye, unspecified eyelid: Secondary | ICD-10-CM

## 2013-11-08 DIAGNOSIS — H00016 Hordeolum externum left eye, unspecified eyelid: Secondary | ICD-10-CM

## 2013-11-08 MED ORDER — TETRACAINE HCL 0.5 % OP SOLN
OPHTHALMIC | Status: AC
  Filled 2013-11-08: qty 2

## 2013-11-08 MED ORDER — SULFAMETHOXAZOLE-TRIMETHOPRIM 800-160 MG PO TABS: 2.0000 | ORAL_TABLET | Freq: Two times a day (BID) | ORAL | Status: DC

## 2013-11-08 NOTE — ED Notes (Signed)
Pt c/o left eye irritation onset 3 days Sx include redness, teary, itchy throat, right ear pain onset today Denies fevers, cold sx Alert; no signs of acute distress.

## 2013-11-08 NOTE — ED Provider Notes (Addendum)
Caleb Heath is a 38 y.o. male who presents to Urgent Care today for left eyelid pain. Patient has pain and swelling at the medial aspect of his left eye. This is been present for about 3 days. No photophobia or blurry vision. No fevers chills nausea vomiting or diarrhea. He has not tried any medications yet. He has tried a bit warm compress which helps some.   Past Medical History  Diagnosis Date  . Fx wrist   . Hypertension   . Anxiety    History  Substance Use Topics  . Smoking status: Current Every Day Smoker  . Smokeless tobacco: Not on file  . Alcohol Use: Yes   ROS as above Medications: No current facility-administered medications for this encounter.   Current Outpatient Prescriptions  Medication Sig Dispense Refill  . amLODipine (NORVASC) 5 MG tablet Take 1 tablet (5 mg total) by mouth daily.  30 tablet  3  . ibuprofen (ADVIL,MOTRIN) 200 MG tablet Take 200 mg by mouth every 6 (six) hours as needed.      . sulfamethoxazole-trimethoprim (SEPTRA DS) 800-160 MG per tablet Take 2 tablets by mouth 2 (two) times daily.  28 tablet  0    Exam:  BP 131/91  Pulse 78  Temp(Src) 98.9 F (37.2 C) (Oral)  Resp 16  SpO2 100% Gen: Well NAD HEENT: EOMI,  MMM medial border of the left eyelid is tender and swollen. Pain-free range of motion. PERRLA. Fluorescein stain shows no uptake. Lungs: Normal work of breathing. CTABL Heart: RRR no MRG Abd: NABS, Soft. Nondistended, Nontender Exts: Brisk capillary refill, warm and well perfused.   Visual Acuity:  Right Eye Distance: 20/15 Left Eye Distance: 20/20 Bilateral Distance: 20/15   No results found for this or any previous visit (from the past 24 hour(s)). No results found.  Assessment and Plan: 38 y.o. male with hordeolum. Warm compress and Bactrim followup as needed.  Discussed warning signs or symptoms. Please see discharge instructions. Patient expresses understanding.   This note was created using Software engineerDragon voice  recognition software. Any transcription errors are unintended.    Rodolph BongEvan S Miken Stecher, MD 11/08/13 [id number]  Rodolph BongEvan S Jennell Janosik, MD 11/08/13 [phone]

## 2013-11-08 NOTE — Discharge Instructions (Signed)
Thank you for coming in today. Use warm compress Take antibiotics twice daily Come back as needed Go to the emergency room if you get worse   Sty A sty (hordeolum) is an infection of a gland in the eyelid located at the base of the eyelash. A sty may develop a white or yellow head of pus. It can be puffy (swollen). Usually, the sty will burst and pus will come out on its own. They do not leave lumps in the eyelid once they drain. A sty is often confused with another form of cyst of the eyelid called a chalazion. Chalazions occur within the eyelid and not on the edge where the bases of the eyelashes are. They often are red, sore and then form firm lumps in the eyelid. CAUSES   Germs (bacteria).  Lasting (chronic) eyelid inflammation. SYMPTOMS   Tenderness, redness and swelling along the edge of the eyelid at the base of the eyelashes.  Sometimes, there is a white or yellow head of pus. It may or may not drain. DIAGNOSIS  An ophthalmologist will be able to distinguish between a sty and a chalazion and treat the condition appropriately.  TREATMENT   Styes are typically treated with warm packs (compresses) until drainage occurs.  In rare cases, medicines that kill germs (antibiotics) may be prescribed. These antibiotics may be in the form of drops, cream or pills.  If a hard lump has formed, it is generally necessary to do a small incision and remove the hardened contents of the cyst in a minor surgical procedure done in the office.  In suspicious cases, your caregiver may send the contents of the cyst to the lab to be certain that it is not a rare, but dangerous form of cancer of the glands of the eyelid. HOME CARE INSTRUCTIONS   Wash your hands often and dry them with a clean towel. Avoid touching your eyelid. This may spread the infection to other parts of the eye.  Apply heat to your eyelid for 10 to 20 minutes, several times a day, to ease pain and help to heal it faster.  Do  not squeeze the sty. Allow it to drain on its own. Wash your eyelid carefully 3 to 4 times per day to remove any pus. SEEK IMMEDIATE MEDICAL CARE IF:   Your eye becomes painful or puffy (swollen).  Your vision changes.  Your sty does not drain by itself within 3 days.  Your sty comes back within a short period of time, even with treatment.  You have redness (inflammation) around the eye.  You have a fever. Document Released: 12/19/2004 Document Revised: 06/03/2011 Document Reviewed: 06/25/2013 [facility] Patient Information 2015 [employer], [place]. This information is not intended to replace advice given to you by your health care provider. Make sure you discuss any questions you have with your health care provider.

## 2013-11-16 ENCOUNTER — Ambulatory Visit: Payer: Self-pay

## 2013-12-24 ENCOUNTER — Encounter (HOSPITAL_COMMUNITY): Payer: Self-pay | Admitting: Emergency Medicine

## 2013-12-24 ENCOUNTER — Emergency Department (INDEPENDENT_AMBULATORY_CARE_PROVIDER_SITE_OTHER)
Admission: EM | Admit: 2013-12-24 | Discharge: 2013-12-24 | Disposition: A | Discharge status: Home / Self Care | Payer: Self-pay | Source: Home / Self Care | Attending: Family Medicine | Admitting: Family Medicine

## 2013-12-24 DIAGNOSIS — H578 Other specified disorders of eye and adnexa: Secondary | ICD-10-CM

## 2013-12-24 DIAGNOSIS — H5789 Other specified disorders of eye and adnexa: Secondary | ICD-10-CM

## 2013-12-24 MED ORDER — TETRACAINE HCL 0.5 % OP SOLN
2.0000 [drp] | Freq: Once | OPHTHALMIC | Status: AC
  Administered 2013-12-24: 2 [drp] via OPHTHALMIC

## 2013-12-24 MED ORDER — TETRACAINE HCL 0.5 % OP SOLN
OPHTHALMIC | Status: AC
  Filled 2013-12-24: qty 2

## 2013-12-24 MED ORDER — ERYTHROMYCIN 5 MG/GM OP OINT: TOPICAL_OINTMENT | OPHTHALMIC | Status: DC

## 2013-12-24 NOTE — Discharge Instructions (Signed)
Thank you for coming in today. Use antibiotic ointment every 6 hours for several days.  Use over-the-counter Zaditor eyedrops (Ketotifen) Use over-the-counter Zyrtec (cetirizine)  Use Systane artificial tears as needed  Conjunctivitis Conjunctivitis is commonly called "pink eye." Conjunctivitis can be caused by bacterial or viral infection, allergies, or injuries. There is usually redness of the lining of the eye, itching, discomfort, and sometimes discharge. There may be deposits of matter along the eyelids. A viral infection usually causes a watery discharge, while a bacterial infection causes a yellowish, thick discharge. Pink eye is very contagious and spreads by direct contact. You may be given antibiotic eyedrops as part of your treatment. Before using your eye medicine, remove all drainage from the eye by washing gently with warm water and cotton balls. Continue to use the medication until you have awakened 2 mornings in a row without discharge from the eye. Do not rub your eye. This increases the irritation and helps spread infection. Use separate towels from other household members. Wash your hands with soap and water before and after touching your eyes. Use cold compresses to reduce pain and sunglasses to relieve irritation from light. Do not wear contact lenses or wear eye makeup until the infection is gone. SEEK MEDICAL CARE IF:   Your symptoms are not better after 3 days of treatment.  You have increased pain or trouble seeing.  The outer eyelids become very red or swollen. Document Released: 04/18/2004 Document Revised: 06/03/2011 Document Reviewed: 03/11/2005 [facility] Patient Information 2015 [employer], [place]. This information is not intended to replace advice given to you by your health care provider. Make sure you discuss any questions you have with your health care provider.

## 2013-12-24 NOTE — ED Notes (Signed)
C/o left eye redness onset this am  Painful; 5/10; increases w/bright light Denies inj/trauma Alert, no signs of acute distress.

## 2013-12-24 NOTE — ED Provider Notes (Signed)
Caleb Heath is a 38 y.o. male who presents to Urgent Care today for left eye irritation. Patient woke this morning with left eye becoming itchy and watery. Symptoms worsened today at work. He denies any foreign body or injury. No blurry vision fevers or chills nausea vomiting or diarrhea. Mild pain present. No treatments tried yet.   Past Medical History  Diagnosis Date  . Fx wrist   . Hypertension   . Anxiety    History  Substance Use Topics  . Smoking status: Current Every Day Smoker  . Smokeless tobacco: Not on file  . Alcohol Use: Yes   ROS as above Medications: No current facility-administered medications for this encounter.   Current Outpatient Prescriptions  Medication Sig Dispense Refill  . amLODipine (NORVASC) 5 MG tablet Take 1 tablet (5 mg total) by mouth daily.  30 tablet  3  . ibuprofen (ADVIL,MOTRIN) 200 MG tablet Take 200 mg by mouth every 6 (six) hours as needed.      . sulfamethoxazole-trimethoprim (SEPTRA DS) 800-160 MG per tablet Take 2 tablets by mouth 2 (two) times daily.  28 tablet  0    Exam:  BP 145/100  Pulse 97  Temp(Src) 98.3 F (36.8 C) (Oral)  Resp 18  SpO2 100% Gen: Well NAD HEENT: EOMI,  MMM mild left eye conjunctival injection. PERRLA bilaterally. Fluorescein stain normal. Lungs: Normal work of breathing. CTABL Heart: RRR no MRG Abd: NABS, Soft. Nondistended, Nontender Exts: Brisk capillary refill, warm and well perfused.   Visual Acuity:  Right Eye Distance: 20/15 Left Eye Distance: 20/20 Bilateral Distance: 20/15   No results found for this or any previous visit (from the past 24 hour(s)). No results found.  Assessment and Plan: 38 y.o. male with left eye objective injection pain. Likely allergic conjunctivitis. Plan to treat with Zaditor. Additionally erythromycin ointment. Followup with ophthalmology if not improving.  Discussed warning signs or symptoms. Please see discharge instructions. Patient expresses  understanding.     Rodolph BongEvan S Keven Osborn, MD 12/24/13 [phone]

## 2014-02-02 ENCOUNTER — Ambulatory Visit (HOSPITAL_BASED_OUTPATIENT_CLINIC_OR_DEPARTMENT_OTHER): Payer: Self-pay | Admitting: *Deleted

## 2014-02-02 ENCOUNTER — Encounter: Payer: Self-pay | Admitting: Internal Medicine

## 2014-02-02 ENCOUNTER — Ambulatory Visit: Payer: Self-pay | Attending: Internal Medicine | Admitting: Internal Medicine

## 2014-02-02 VITALS — BP 159/111 | HR 99 | Temp 98.9°F | Resp 16 | Ht 74.0 in | Wt 176.0 lb

## 2014-02-02 DIAGNOSIS — Z23 Encounter for immunization: Secondary | ICD-10-CM

## 2014-02-02 DIAGNOSIS — Z720 Tobacco use: Secondary | ICD-10-CM

## 2014-02-02 DIAGNOSIS — F17200 Nicotine dependence, unspecified, uncomplicated: Secondary | ICD-10-CM | POA: Insufficient documentation

## 2014-02-02 DIAGNOSIS — Z8249 Family history of ischemic heart disease and other diseases of the circulatory system: Secondary | ICD-10-CM | POA: Insufficient documentation

## 2014-02-02 DIAGNOSIS — I10 Essential (primary) hypertension: Secondary | ICD-10-CM | POA: Insufficient documentation

## 2014-02-02 MED ORDER — AMLODIPINE BESYLATE 10 MG PO TABS: 5.0000 mg | ORAL_TABLET | Freq: Every day | ORAL | Status: DC

## 2014-02-02 NOTE — Progress Notes (Signed)
Pt is here following up on his HTN. Pt is here to get a flu shot. Pt has no C.C. Today Pt is currently smoking.

## 2014-02-02 NOTE — Progress Notes (Addendum)
Patient ID: Caleb Heath, male   DOB: 10/12/1975, 38 y.o.   MRN: [id number]  CC:  HTN  HPI:  Patient reports that he only took his blood pressure medication for one month.  He states that stopped taking the medication because he forgot.  He started back 5 days ago.  He reports that he does have improvement in headaches since beginning to take medication recently.  He denies dizziness, chest pain, SOB, and edema.  He denies any complications with medication use.  He does continue to smoke daily.    No Known Allergies Past Medical History  Diagnosis Date  . Fx wrist   . Hypertension   . Anxiety    Current Outpatient Prescriptions on File Prior to Visit  Medication Sig Dispense Refill  . amLODipine (NORVASC) 5 MG tablet Take 1 tablet (5 mg total) by mouth daily. 30 tablet 3  . erythromycin ophthalmic ointment Place a 1/2 inch ribbon of ointment into the lower eyelid every 6 hours 1 g 0   No current facility-administered medications on file prior to visit.   Family History  Problem Relation Age of Onset  . Hypertension Mother   . Cancer Father 2650    colon   History   Social History  . Marital Status: Single    Spouse Name: N/A    Number of Children: N/A  . Years of Education: N/A   Occupational History  . Not on file.   Social History Main Topics  . Smoking status: Current Every Day Smoker  . Smokeless tobacco: Not on file  . Alcohol Use: Yes  . Drug Use: No  . Sexual Activity: Not on file   Other Topics Concern  . Not on file   Social History Narrative    Review of Systems  Neurological: Positive for headaches.  All other systems reviewed and are negative.     Objective:   Filed Vitals:   02/02/14 1648  BP: 147/99  Pulse: 99  Temp: 98.9 F (37.2 C)  Resp: 16    Physical Exam  Constitutional: He is oriented to person, place, and time.  Neck: No JVD present.  Cardiovascular: Normal rate, regular rhythm and normal heart sounds.   Pulmonary/Chest:  Effort normal and breath sounds normal.  Abdominal: Soft. Bowel sounds are normal.  Musculoskeletal: Normal range of motion. He exhibits no edema.  Neurological: He is alert and oriented to person, place, and time.  Skin: Skin is warm and dry.  Psychiatric: He has a normal mood and affect.     Lab Results  Component Value Date   WBC 8.4 09/06/2013   HGB 14.2 09/06/2013   HCT 41.4 09/06/2013   MCV 91.2 09/06/2013   PLT 261 09/06/2013   Lab Results  Component Value Date   CREATININE 0.68 09/06/2013   BUN 10 09/06/2013   NA 138 09/06/2013   K 4.4 09/06/2013   CL 98 09/06/2013   CO2 22 09/06/2013    No results found for: HGBA1C Lipid Panel     Component Value Date/Time   CHOL 180 10/26/2013 0929   TRIG 42 10/26/2013 0929   HDL 97 10/26/2013 0929   CHOLHDL 1.9 10/26/2013 0929   VLDL 8 10/26/2013 0929   LDLCALC 75 10/26/2013 0929       Assessment and plan:   Caleb Heath was seen today for follow-up.  Diagnoses and associated orders for this visit:  Essential hypertension - amLODipine (NORVASC) 10 MG tablet; Take 0.5 tablets (5 mg total)  by mouth daily. Patient blood pressure remains elevated today, will increase BP medication and have patient to return in 2 weeks for blood pressure recheck with nurse. Stressed diet changes, regular exercise regimen, and modifiable risk factors. Will follow up with CMP as needed, Will follow up with patient in 3-6 months.   Need for influenza vaccination Influenza injection received.  Explained side effects and contraindications to patient. Information sheet given to patient.  Smoker Smoking cessation discussed for 3 minutes, patient is not willing to quit at this time. Will continue to assess on each visit. Discussed increased risk for diseases such as cancer, heart disease, and stroke.      Return in about 2 weeks (around 02/16/2014) for Nurse Visit-BP check and 3 mo PCP.        Holland CommonsKECK, VALERIE, NP-C [facility] [phone] 02/02/2014, 4:59 PM

## 2014-02-02 NOTE — Patient Instructions (Signed)
Hypertension Hypertension, commonly called high blood pressure, is when the force of blood pumping through your arteries is too strong. Your arteries are the blood vessels that carry blood from your heart throughout your body. A blood pressure reading consists of a higher number over a lower number, such as 110/72. The higher number (systolic) is the pressure inside your arteries when your heart pumps. The lower number (diastolic) is the pressure inside your arteries when your heart relaxes. Ideally you want your blood pressure below 120/80. Hypertension forces your heart to work harder to pump blood. Your arteries may become narrow or stiff. Having hypertension puts you at risk for heart disease, stroke, and other problems.  RISK FACTORS Some risk factors for high blood pressure are controllable. Others are not.  Risk factors you cannot control include:   Race. You may be at higher risk if you are African American.  Age. Risk increases with age.  Gender. Men are at higher risk than women before age 45 years. After age 65, women are at higher risk than men. Risk factors you can control include:  Not getting enough exercise or physical activity.  Being overweight.  Getting too much fat, sugar, calories, or salt in your diet.  Drinking too much alcohol. SIGNS AND SYMPTOMS Hypertension does not usually cause signs or symptoms. Extremely high blood pressure (hypertensive crisis) may cause headache, anxiety, shortness of breath, and nosebleed. DIAGNOSIS  To check if you have hypertension, your health care provider will measure your blood pressure while you are seated, with your arm held at the level of your heart. It should be measured at least twice using the same arm. Certain conditions can cause a difference in blood pressure between your right and left arms. A blood pressure reading that is higher than normal on one occasion does not mean that you need treatment. If one blood pressure reading  is high, ask your health care provider about having it checked again. TREATMENT  Treating high blood pressure includes making lifestyle changes and possibly taking medicine. Living a healthy lifestyle can help lower high blood pressure. You may need to change some of your habits. Lifestyle changes may include:  Following the DASH diet. This diet is high in fruits, vegetables, and whole grains. It is low in salt, red meat, and added sugars.  Getting at least 2 hours of brisk physical activity every week.  Losing weight if necessary.  Not smoking.  Limiting alcoholic beverages.  Learning ways to reduce stress. If lifestyle changes are not enough to get your blood pressure under control, your health care provider may prescribe medicine. You may need to take more than one. Work closely with your health care provider to understand the risks and benefits. HOME CARE INSTRUCTIONS  Have your blood pressure rechecked as directed by your health care provider.   Take medicines only as directed by your health care provider. Follow the directions carefully. Blood pressure medicines must be taken as prescribed. The medicine does not work as well when you skip doses. Skipping doses also puts you at risk for problems.   Do not smoke.   Monitor your blood pressure at home as directed by your health care provider. SEEK MEDICAL CARE IF:   You think you are having a reaction to medicines taken.  You have recurrent headaches or feel dizzy.  You have swelling in your ankles.  You have trouble with your vision. SEEK IMMEDIATE MEDICAL CARE IF:  You develop a severe headache or confusion.    You have unusual weakness, numbness, or feel faint.  You have severe chest or abdominal pain.  You vomit repeatedly.  You have trouble breathing. MAKE SURE YOU:   Understand these instructions.  Will watch your condition.  Will get help right away if you are not doing well or get worse. Document  Released: 03/11/2005 Document Revised: 07/26/2013 Document Reviewed: 01/01/2013 Exit[employer] Patient Information 2015 [employer]. This information is not intended to replace advice given to you by your health care provider. Make sure you discuss any questions you have with your health care provider.   Smoking Cessation Quitting smoking is important to your health and has many advantages. However, it is not always easy to quit since nicotine is a very addictive drug. Oftentimes, people try 3 times or more before being able to quit. This document explains the best ways for you to prepare to quit smoking. Quitting takes hard work and a lot of effort, but you can do it. ADVANTAGES OF QUITTING SMOKING  You will live longer, feel better, and live better.  Your body will feel the impact of quitting smoking almost immediately.  Within 20 minutes, blood pressure decreases. Your pulse returns to its normal level.  After 8 hours, carbon monoxide levels in the blood return to normal. Your oxygen level increases.  After 24 hours, the chance of having a heart attack starts to decrease. Your breath, hair, and body stop smelling like smoke.  After 48 hours, damaged nerve endings begin to recover. Your sense of taste and smell improve.  After 72 hours, the body is virtually free of nicotine. Your bronchial tubes relax and breathing becomes easier.  After 2 to 12 weeks, lungs can hold more air. Exercise becomes easier and circulation improves.  The risk of having a heart attack, stroke, cancer, or lung disease is greatly reduced.  After 1 year, the risk of coronary heart disease is cut in half.  After 5 years, the risk of stroke falls to the same as a nonsmoker.  After 10 years, the risk of lung cancer is cut in half and the risk of other cancers decreases significantly.  After 15 years, the risk of coronary heart disease drops, usually to the level of a nonsmoker.  If you are pregnant, quitting  smoking will improve your chances of having a healthy baby.  The people you live with, especially any children, will be healthier.  You will have extra money to spend on things other than cigarettes. QUESTIONS TO THINK ABOUT BEFORE ATTEMPTING TO QUIT You may want to talk about your answers with your health care provider.  Why do you want to quit?  If you tried to quit in the past, what helped and what did not?  What will be the most difficult situations for you after you quit? How will you plan to handle them?  Who can help you through the tough times? Your family? Friends? A health care provider?  What pleasures do you get from smoking? What ways can you still get pleasure if you quit? Here are some questions to ask your health care provider:  How can you help me to be successful at quitting?  What medicine do you think would be best for me and how should I take it?  What should I do if I need more help?  What is smoking withdrawal like? How can I get information on withdrawal? GET READY  Set a quit date.  Change your environment by getting rid of all   cigarettes, ashtrays, matches, and lighters in your home, car, or work. Do not let people smoke in your home.  Review your past attempts to quit. Think about what worked and what did not. GET SUPPORT AND ENCOURAGEMENT You have a better chance of being successful if you have help. You can get support in many ways.  Tell your family, friends, and coworkers that you are going to quit and need their support. Ask them not to smoke around you.  Get individual, group, or telephone counseling and support. Programs are available at local hospitals and health centers. Call your local health department for information about programs in your area.  Spiritual beliefs and practices may help some smokers quit.  Download a "quit meter" on your computer to keep track of quit statistics, such as how long you have gone without smoking,  cigarettes not smoked, and money saved.  Get a self-help book about quitting smoking and staying off tobacco. LEARN NEW SKILLS AND BEHAVIORS  Distract yourself from urges to smoke. Talk to someone, go for a walk, or occupy your time with a task.  Change your normal routine. Take a different route to work. Drink tea instead of coffee. Eat breakfast in a different place.  Reduce your stress. Take a hot bath, exercise, or read a book.  Plan something enjoyable to do every day. Reward yourself for not smoking.  Explore interactive web-based programs that specialize in helping you quit. GET MEDICINE AND USE IT CORRECTLY Medicines can help you stop smoking and decrease the urge to smoke. Combining medicine with the above behavioral methods and support can greatly increase your chances of successfully quitting smoking.  Nicotine replacement therapy helps deliver nicotine to your body without the negative effects and risks of smoking. Nicotine replacement therapy includes nicotine gum, lozenges, inhalers, nasal sprays, and skin patches. Some may be available over-the-counter and others require a prescription.  Antidepressant medicine helps people abstain from smoking, but how this works is unknown. This medicine is available by prescription.  Nicotinic receptor partial agonist medicine simulates the effect of nicotine in your brain. This medicine is available by prescription. Ask your health care provider for advice about which medicines to use and how to use them based on your health history. Your health care provider will tell you what side effects to look out for if you choose to be on a medicine or therapy. Carefully read the information on the package. Do not use any other product containing nicotine while using a nicotine replacement product.  RELAPSE OR DIFFICULT SITUATIONS Most relapses occur within the first 3 months after quitting. Do not be discouraged if you start smoking again. Remember,  most people try several times before finally quitting. You may have symptoms of withdrawal because your body is used to nicotine. You may crave cigarettes, be irritable, feel very hungry, cough often, get headaches, or have difficulty concentrating. The withdrawal symptoms are only temporary. They are strongest when you first quit, but they will go away within 10-14 days. To reduce the chances of relapse, try to:  Avoid drinking alcohol. Drinking lowers your chances of successfully quitting.  Reduce the amount of caffeine you consume. Once you quit smoking, the amount of caffeine in your body increases and can give you symptoms, such as a rapid heartbeat, sweating, and anxiety.  Avoid smokers because they can make you want to smoke.  Do not let weight gain distract you. Many smokers will gain weight when they quit, usually less than 10   pounds. Eat a healthy diet and stay active. You can always lose the weight gained after you quit.  Find ways to improve your mood other than smoking. FOR MORE INFORMATION  www.smokefree.gov  Document Released: 03/05/2001 Document Revised: 07/26/2013 Document Reviewed: 06/20/2011 Exit[employer] Patient Information 2015 [employer]. This information is not intended to replace advice given to you by your health care provider. Make sure you discuss any questions you have with your health care provider.  

## 2014-02-19 ENCOUNTER — Emergency Department (HOSPITAL_COMMUNITY)
Admission: EM | Admit: 2014-02-19 | Discharge: 2014-02-19 | Disposition: A | Discharge status: Home / Self Care | Payer: Self-pay | Attending: Emergency Medicine | Admitting: Emergency Medicine

## 2014-02-19 ENCOUNTER — Emergency Department (HOSPITAL_COMMUNITY): Payer: Self-pay

## 2014-02-19 ENCOUNTER — Encounter (HOSPITAL_COMMUNITY): Payer: Self-pay | Admitting: *Deleted

## 2014-02-19 DIAGNOSIS — K644 Residual hemorrhoidal skin tags: Secondary | ICD-10-CM | POA: Insufficient documentation

## 2014-02-19 DIAGNOSIS — R0789 Other chest pain: Secondary | ICD-10-CM | POA: Insufficient documentation

## 2014-02-19 DIAGNOSIS — I10 Essential (primary) hypertension: Secondary | ICD-10-CM | POA: Insufficient documentation

## 2014-02-19 DIAGNOSIS — F419 Anxiety disorder, unspecified: Secondary | ICD-10-CM | POA: Insufficient documentation

## 2014-02-19 DIAGNOSIS — R079 Chest pain, unspecified: Secondary | ICD-10-CM

## 2014-02-19 DIAGNOSIS — R202 Paresthesia of skin: Secondary | ICD-10-CM

## 2014-02-19 DIAGNOSIS — Z720 Tobacco use: Secondary | ICD-10-CM | POA: Insufficient documentation

## 2014-02-19 DIAGNOSIS — R209 Unspecified disturbances of skin sensation: Secondary | ICD-10-CM | POA: Insufficient documentation

## 2014-02-19 LAB — COMPREHENSIVE METABOLIC PANEL
ALBUMIN: 4.3 g/dL (ref 3.5–5.2)
ALK PHOS: 73 U/L (ref 39–117)
ALT: 16 U/L (ref 0–53)
AST: 35 U/L (ref 0–37)
Anion gap: 17 — ABNORMAL HIGH (ref 5–15)
BUN: 6 mg/dL (ref 6–23)
CHLORIDE: 103 meq/L (ref 96–112)
CO2: 22 mEq/L (ref 19–32)
Calcium: 9.1 mg/dL (ref 8.4–10.5)
Creatinine, Ser: 0.68 mg/dL (ref 0.50–1.35)
GFR calc Af Amer: >90 mL/min (ref >90)
GFR calc non Af Amer: >90 mL/min (ref >90)
Glucose, Bld: 96 mg/dL (ref 70–99)
POTASSIUM: 4.6 meq/L (ref 3.7–5.3)
Sodium: 142 mEq/L (ref 137–147)
Total Protein: 7.7 g/dL (ref 6.0–8.3)

## 2014-02-19 LAB — CBC WITH DIFFERENTIAL/PLATELET
BASOS ABS: 0.1 10*3/uL (ref 0.0–0.1)
BASOS PCT: 1 % (ref 0–1)
Eosinophils Absolute: 0.1 10*3/uL (ref 0.0–0.7)
Eosinophils Relative: 1 % (ref 0–5)
HCT: 40.4 % (ref 39.0–52.0)
HEMOGLOBIN: 13.5 g/dL (ref 13.0–17.0)
Lymphocytes Relative: 50 % — ABNORMAL HIGH (ref 12–46)
Lymphs Abs: 4.4 10*3/uL — ABNORMAL HIGH (ref 0.7–4.0)
MCH: 29.9 pg (ref 26.0–34.0)
MCHC: 33.4 g/dL (ref 30.0–36.0)
MCV: 89.4 fL (ref 78.0–100.0)
Monocytes Absolute: 0.7 10*3/uL (ref 0.1–1.0)
Monocytes Relative: 8 % (ref 3–12)
NEUTROS ABS: 3.5 10*3/uL (ref 1.7–7.7)
NEUTROS PCT: 40 % — AB (ref 43–77)
Platelets: 244 10*3/uL (ref 150–400)
RBC: 4.52 MIL/uL (ref 4.22–5.81)
RDW: 13.4 % (ref 11.5–15.5)
WBC: 8.8 10*3/uL (ref 4.0–10.5)

## 2014-02-19 LAB — TROPONIN I

## 2014-02-19 MED ORDER — HYDROCORTISONE 2.5 % RE CREA: TOPICAL_CREAM | RECTAL | Status: DC

## 2014-02-19 MED ORDER — SODIUM CHLORIDE 0.9 % IV BOLUS (SEPSIS)
1000.0000 mL | INTRAVENOUS | Status: AC
Start: 2014-02-19 — End: 2014-02-19
  Administered 2014-02-19: 1000 mL via INTRAVENOUS

## 2014-02-19 MED ORDER — AMLODIPINE BESYLATE 5 MG PO TABS
5.0000 mg | ORAL_TABLET | Freq: Once | ORAL | Status: AC
  Administered 2014-02-19: 5 mg via ORAL
  Filled 2014-02-19: qty 1

## 2014-02-19 NOTE — ED Notes (Signed)
The pt is c/o rt arm pain and swelling  Since this am ??  He first said yesterday am then said 2 hours ago  Unsure of onset.  He admits to drinking alcohol tonight

## 2014-02-19 NOTE — ED Provider Notes (Signed)
CSN: [id number]     Arrival date & time 02/19/14  0131 History  This chart was scribed for Purvis Sheffield, MD by Gwenyth Ober, ED Scribe. This patient was seen in room A11C/A11C and the patient's care was started at 1:54 AM.    Chief Complaint  Patient presents with  . Arm Pain   Patient is a 38 y.o. male presenting with chest pain. The history is provided by the patient. No language interpreter was used.  Chest Pain Pain location:  R chest Pain quality: shooting   Pain radiates to the back: no   Pain severity:  Moderate Onset quality:  Gradual Timing:  Intermittent Chronicity:  New Relieved by:  None tried Worsened by:  Nothing tried Ineffective treatments:  None tried Associated symptoms: numbness   Associated symptoms: no back pain, no cough, no fatigue, not vomiting and no weakness   Risk factors: smoking   Risk factors: no prior DVT/PE     HPI Comments: Caleb Heath is a 38 y.o. male with a history of HTN who presents to the Emergency Department complaining of intermittent, shooting, right-sided chest pain that starts in his right arm and began earlier today. He states swelling to his right hand and right arm and intermittent numbness in his right arm as associated symptoms. Pt reports that he has had 6 episodes of shooting chest pain that last 1-2 minutes at a time and are accompanied by numbness in right arm. He notes that swelling is constant, but that the arm numbness and CP occur together. Pt denies a history of cancer and blood clots. He states he hits his head frequently and has had several CT scans. He also notes that he has taken 1 dose of Amlodipine earlier today. Pt drank EtOH PTA, but states he does not drink daily. He does smoke cigarettes. Pt reports baseline SOB which is unchanged today. He denies weakness, vomiting and hemoptysis as associated symptoms.  Past Medical History  Diagnosis Date  . Fx wrist   . Hypertension   . Anxiety    History reviewed. No  pertinent past surgical history. Family History  Problem Relation Age of Onset  . Hypertension Mother   . Cancer Father 43    colon   History  Substance Use Topics  . Smoking status: Current Every Day Smoker  . Smokeless tobacco: Not on file  . Alcohol Use: Yes    Review of Systems  Constitutional: Negative for appetite change and fatigue.  HENT: Negative for congestion, ear discharge and sinus pressure.   Eyes: Negative for discharge.  Respiratory: Negative for cough.   Cardiovascular: Positive for chest pain.  Gastrointestinal: Negative for vomiting and diarrhea.  Genitourinary: Negative for frequency and hematuria.  Musculoskeletal: Positive for joint swelling and arthralgias. Negative for back pain.  Skin: Negative for rash.  Neurological: Positive for numbness. Negative for seizures and weakness.  Psychiatric/Behavioral: Negative for hallucinations.   Allergies  Review of patient's allergies indicates no known allergies.  Home Medications   Prior to Admission medications   Medication Sig Start Date End Date Taking? Authorizing Provider  amLODipine (NORVASC) 10 MG tablet Take 0.5 tablets (5 mg total) by mouth daily. 02/02/14   Ambrose Finland, NP  erythromycin ophthalmic ointment Place a 1/2 inch ribbon of ointment into the lower eyelid every 6 hours 12/24/13   Rodolph Bong, MD   BP 163/102 mmHg  Pulse 54  Temp(Src) 98 F (36.7 C) (Oral)  Resp 18  Ht 6\' 2"  (  1.88 m)  Wt 175 lb (79.379 kg)  BMI 22.46 kg/m2  SpO2 98% Physical Exam  Constitutional: He is oriented to person, place, and time. He appears well-developed and well-nourished.  HENT:  Head: Normocephalic and atraumatic.  Mouth/Throat: Oropharynx is clear and moist.  Eyes: EOM are normal. Pupils are equal, round, and reactive to light.  Neck: Normal range of motion.  Cardiovascular: Normal rate, regular rhythm, normal heart sounds and intact distal pulses.   Pulmonary/Chest: Effort normal and breath sounds  normal. No respiratory distress.  Abdominal: Soft. He exhibits no distension. There is no tenderness.  Genitourinary:   Mild external hemorrhoids noted on exam.  Musculoskeletal: Normal range of motion. He exhibits no edema or tenderness.  2+ distal pulses in bilateral upper extremities  Neurological: He is alert and oriented to person, place, and time.  alert, oriented x3, mildly intoxicated speech: normal in context and clarity memory: intact grossly cranial nerves II-XII: intact motor strength: full proximally and distally no involuntary movements or tremors sensation: intact to light touch diffusely  cerebellar: finger-to-nose and heel-to-shin intact gait: normal forwards and backwards  Skin: Skin is warm and dry.  Psychiatric: He has a normal mood and affect. Judgment normal.  Nursing note and vitals reviewed.   ED Course  Procedures  DIAGNOSTIC STUDIES: Oxygen Saturation is 98% on RA, normal by my interpretation.    COORDINATION OF CARE: 2:05 AM Discussed treatment plan with pt which includes chest x-ray, CT head, lab work and EKG. Pt agreed to plan.   Labs Review Labs Reviewed  CBC WITH DIFFERENTIAL - Abnormal; Notable for the following:    Neutrophils Relative % 40 (*)    Lymphocytes Relative 50 (*)    Lymphs Abs 4.4 (*)    All other components within normal limits  COMPREHENSIVE METABOLIC PANEL - Abnormal; Notable for the following:    Total Bilirubin <0.2 (*)    Anion gap 17 (*)    All other components within normal limits  TROPONIN I    Imaging Review Dg Chest 2 View  02/19/2014   CLINICAL DATA:  Acute onset of right-sided chest pain and right arm paresthesias for 1 day. Initial encounter.  EXAM: CHEST  2 VIEW  COMPARISON:  Chest radiograph performed 08/24/2012  FINDINGS: The lungs are well-aerated. Mild chronic peribronchial thickening is noted. There is no evidence of focal opacification, pleural effusion or pneumothorax.  The heart is normal in size; the  mediastinal contour is within normal limits. No acute osseous abnormalities are seen.  IMPRESSION: No acute cardiopulmonary process seen. Mild chronic peribronchial thickening noted.   Electronically Signed   By: Roanna RaiderJeffery  Chang M.D.   On: 02/19/2014 02:30   Ct Head Wo Contrast  02/19/2014   CLINICAL DATA:  RIGHT chest pain, RIGHT hand swelling, possible paresthesias. History of wrist fracture.  EXAM: CT HEAD WITHOUT CONTRAST  TECHNIQUE: Contiguous axial images were obtained from the base of the skull through the vertex without intravenous contrast.  COMPARISON:  CT of the head February 04, 2006  FINDINGS: The ventricles and sulci are normal. No intraparenchymal hemorrhage, mass effect nor midline shift. No acute large vascular territory infarcts. Cerebellar tonsils are at but not definitely below the foramen magnum.  No abnormal extra-axial fluid collections. Basal cisterns are patent.  No skull fracture. The included ocular globes and orbital contents are non-suspicious. The mastoid aircells and included paranasal sinuses are well-aerated.  IMPRESSION: No acute intracranial process.  Similar low-lying cerebellar tonsils, if clinical concern for  Chiari 1 malformation, MRI of the brain could be considered, including sagittal T2 sequence on a nonemergent basis.   Electronically Signed   By: Awilda Metroourtnay  Bloomer   On: 02/19/2014 02:51     EKG Interpretation   Date/Time:  Saturday February 19 2014 01:40:48 EST Ventricular Rate:  105 PR Interval:  148 QRS Duration: 90 QT Interval:  328 QTC Calculation: 433 R Axis:   50 Text Interpretation:  Sinus tachycardia Possible Left atrial enlargement  Possible Anteroseptal infarct , age undetermined No significant change  since last tracing Confirmed by Santina Trillo  MD, Ahkeem Goede ([id number]) on 02/19/2014  2:05:43 AM      MDM   Final diagnoses:  Right-sided chest pain  Arm paresthesia, right  External hemorrhoids    3:36 AM 38 y.o. male  Who presents with  intermittent paresthesias in his right upper extremity associated with right-sided chest pain which began today. He is currently asymptomatic. He has a normal neurologic exam. He also complains of pain from his hemorrhoids. I doubt that this is a CVA or TIA given his intermittent symptoms and associated chest pain with the paresthesias. He is a smoker and has a history of hypertension. His chest pain is very atypical. Low risk for MACE per HEART score. No new sob, doubt PE,  Even though he is mildly tachycardic. Pain is not pleuritic. O2 sat 100% on RA. Sx intermittent and only last for a minute.   3:38 AM:  I have discussed the diagnosis/risks/treatment options with the patient and believe the pt to be eligible for discharge home to follow-up with his pcp as scheduled next week. We also discussed returning to the ED immediately if new or worsening sx occur. We discussed the sx which are most concerning (e.g., worsening pain, fever) that necessitate immediate return. Medications administered to the patient during their visit and any new prescriptions provided to the patient are listed below.  Medications given during this visit Medications  sodium chloride 0.9 % bolus 1,000 mL (0 mLs Intravenous Stopped 02/19/14 0333)  amLODipine (NORVASC) tablet 5 mg (5 mg Oral Given 02/19/14 0217)    New Prescriptions   HYDROCORTISONE (ANUSOL-HC) 2.5 % RECTAL CREAM    Apply rectally 2 times daily       I personally performed the services described in this documentation, which was scribed in my presence. The recorded information has been reviewed and is accurate.    Purvis SheffieldForrest Vence Lalor, MD 02/19/14 [phone]

## 2014-02-21 ENCOUNTER — Ambulatory Visit: Payer: Self-pay | Attending: Internal Medicine | Admitting: *Deleted

## 2014-02-21 VITALS — BP 148/96 | HR 106 | Temp 98.8°F | Resp 16

## 2014-02-21 DIAGNOSIS — I10 Essential (primary) hypertension: Secondary | ICD-10-CM | POA: Insufficient documentation

## 2014-02-21 NOTE — Progress Notes (Signed)
Patient presents for BP check States taking amlodipine 5 mg bid Smoking .5 ppd  BP 146/96 left arm manually P 106 R 16 T 98.8 oral SPO2 97%  Patient reports smoking cigarette 10 minutes prior to BP reading Discussed how smoking can affect BP and heart rate Patient states he would like to quit smoking but new job makes that difficult  Will discuss with PCP and call patient back

## 2014-02-22 ENCOUNTER — Telehealth: Payer: Self-pay | Admitting: *Deleted

## 2014-02-22 DIAGNOSIS — I10 Essential (primary) hypertension: Secondary | ICD-10-CM

## 2014-02-22 MED ORDER — AMLODIPINE BESYLATE 10 MG PO TABS: 10.0000 mg | ORAL_TABLET | Freq: Every day | ORAL | Status: DC

## 2014-02-22 NOTE — Telephone Encounter (Deleted)
Patient notified that he is to begin amlodipine 10 mg once daily. He will make appt for nurse visit in 2 weeks for BP check. If at that time his BP is still elevated, he will add HCTZ 12.5 mg daily per PCP    Ambrose FinlandValerie A Keck, NP  Fredderick SeveranceLaurenze L Zechariah Bissonnette, RN            I wanted him to take amlodipine 10 mg once daily. Please fix prescription for me in Epic. Make sure he has been taking the full 10 mg daily. If he has then please add HCTZ 12.5 mg daily. Have him return in 2 weeks for a recheck after beginning new medication. Thanks

## 2014-02-22 NOTE — Telephone Encounter (Signed)
Patient notified that he is to begin amlodipine 10 mg once daily. He will make appt for nurse visit in 2 weeks for BP check. If at that time his BP is still elevated, he will add HCTZ 12.5 mg daily per PCP

## 2014-04-04 ENCOUNTER — Ambulatory Visit: Payer: Self-pay | Admitting: Internal Medicine

## 2014-04-12 ENCOUNTER — Encounter: Payer: Self-pay | Admitting: Internal Medicine

## 2014-04-12 ENCOUNTER — Ambulatory Visit: Payer: No Typology Code available for payment source | Attending: Internal Medicine | Admitting: Internal Medicine

## 2014-04-12 VITALS — BP 110/75 | HR 109 | Temp 99.6°F | Resp 16 | Ht 74.0 in | Wt 175.0 lb

## 2014-04-12 DIAGNOSIS — Z720 Tobacco use: Secondary | ICD-10-CM | POA: Diagnosis not present

## 2014-04-12 DIAGNOSIS — M545 Low back pain, unspecified: Secondary | ICD-10-CM

## 2014-04-12 DIAGNOSIS — F17200 Nicotine dependence, unspecified, uncomplicated: Secondary | ICD-10-CM

## 2014-04-12 DIAGNOSIS — I10 Essential (primary) hypertension: Secondary | ICD-10-CM | POA: Insufficient documentation

## 2014-04-12 MED ORDER — TRAMADOL HCL 50 MG PO TABS: 50.0000 mg | ORAL_TABLET | Freq: Two times a day (BID) | ORAL | Status: DC | PRN

## 2014-04-12 NOTE — Progress Notes (Signed)
Patient ID: Caleb Heath, male   DOB: 01/15/1976, 39 y.o.   MRN: [id number]  Subjective:  Caleb Heath is a 39 y.o. male with hypertension. Current Outpatient Prescriptions  Medication Sig Dispense Refill  . amLODipine (NORVASC) 10 MG tablet Take 1 tablet (10 mg total) by mouth daily. 30 tablet 3  . erythromycin ophthalmic ointment Place a 1/2 inch ribbon of ointment into the lower eyelid every 6 hours (Patient not taking: Reported on 02/21/2014) 1 g 0  . hydrocortisone (ANUSOL-HC) 2.5 % rectal cream Apply rectally 2 times daily (Patient not taking: Reported on 02/21/2014) 28 g 0   No current facility-administered medications for this visit.    Hypertension ROS: taking medications as instructed, no medication side effects noted, no TIA's, no chest pain on exertion, no dyspnea on exertion, no swelling of ankles, no orthostatic dizziness or lightheadedness and no palpitations.  New concerns: He is still struggling with smoking cessation. Low back pain from injury shoveling at work. He has been to a workers comp doctor but has been released to return to work.  The injury occurred in November. He states that he has been having tightness and soreness of his lower back.   Objective:  BP 110/75 mmHg  Pulse 109  Temp(Src) 99.6 F (37.6 C) (Oral)  Resp 16  Ht 6\' 2"  (1.88 m)  Wt 175 lb (79.379 kg)  BMI 22.46 kg/m2  SpO2 97%  Appearance alert, well appearing, and in no distress and oriented to person, place, and time. General exam BP noted to be well controlled today in office, S1, S2 normal, no gallop, no murmur, chest clear, no JVD, no HSM, no edema, CVS exam  - normal rate, regular rhythm, normal S1, S2, no murmurs, rubs, clicks or gallops, normal rate and regular rhythm, normal bilateral carotid upstroke without bruits, no JVD.  Lab review: labs are reviewed, up to date and normal.   Assessment:   Hypertension improved.   Plan:  Current treatment plan is effective, no change in  therapy. Orders and follow up as documented in patient record. Very strongly urged to quit smoking to reduce cardiovascular risk.Caleb Heath.  Caleb Heath was seen today for follow-up.  Diagnoses and associated orders for this visit:  Essential hypertension  Bilateral low back pain without sciatica - traMADol (ULTRAM) 50 MG tablet; Take 1 tablet (50 mg total) by mouth every 12 (twelve) hours as needed.  Smoking Smoking cessation discussed for 3 minutes, patient is not willing to quit at this time. Will continue to assess on each visit. Discussed increased risk for diseases such as cancer, heart disease, and stroke.   Holland CommonsKECK, VALERIE, NP 04/12/2014 6:24 PM

## 2014-04-12 NOTE — Progress Notes (Signed)
Pt is here following up on his HTN and his chronic pain in his lower back.

## 2014-04-12 NOTE — Patient Instructions (Signed)
Smoking Cessation Quitting smoking is important to your health and has many advantages. However, it is not always easy to quit since nicotine is a very addictive drug. Oftentimes, people try 3 times or more before being able to quit. This document explains the best ways for you to prepare to quit smoking. Quitting takes hard work and a lot of effort, but you can do it. ADVANTAGES OF QUITTING SMOKING  You will live longer, feel better, and live better.  Your body will feel the impact of quitting smoking almost immediately.  Within 20 minutes, blood pressure decreases. Your pulse returns to its normal level.  After 8 hours, carbon monoxide levels in the blood return to normal. Your oxygen level increases.  After 24 hours, the chance of having a heart attack starts to decrease. Your breath, hair, and body stop smelling like smoke.  After 48 hours, damaged nerve endings begin to recover. Your sense of taste and smell improve.  After 72 hours, the body is virtually free of nicotine. Your bronchial tubes relax and breathing becomes easier.  After 2 to 12 weeks, lungs can hold more air. Exercise becomes easier and circulation improves.  The risk of having a heart attack, stroke, cancer, or lung disease is greatly reduced.  After 1 year, the risk of coronary heart disease is cut in half.  After 5 years, the risk of stroke falls to the same as a nonsmoker.  After 10 years, the risk of lung cancer is cut in half and the risk of other cancers decreases significantly.  After 15 years, the risk of coronary heart disease drops, usually to the level of a nonsmoker.  If you are pregnant, quitting smoking will improve your chances of having a healthy baby.  The people you live with, especially any children, will be healthier.  You will have extra money to spend on things other than cigarettes. QUESTIONS TO THINK ABOUT BEFORE ATTEMPTING TO QUIT You may want to talk about your answers with your  health care provider.  Why do you want to quit?  If you tried to quit in the past, what helped and what did not?  What will be the most difficult situations for you after you quit? How will you plan to handle them?  Who can help you through the tough times? Your family? Friends? A health care provider?  What pleasures do you get from smoking? What ways can you still get pleasure if you quit? Here are some questions to ask your health care provider:  How can you help me to be successful at quitting?  What medicine do you think would be best for me and how should I take it?  What should I do if I need more help?  What is smoking withdrawal like? How can I get information on withdrawal? GET READY  Set a quit date.  Change your environment by getting rid of all cigarettes, ashtrays, matches, and lighters in your home, car, or work. Do not let people smoke in your home.  Review your past attempts to quit. Think about what worked and what did not. GET SUPPORT AND ENCOURAGEMENT You have a better chance of being successful if you have help. You can get support in many ways.  Tell your family, friends, and coworkers that you are going to quit and need their support. Ask them not to smoke around you.  Get individual, group, or telephone counseling and support. Programs are available at local hospitals and health centers. Call   your local health department for information about programs in your area.  Spiritual beliefs and practices may help some smokers quit.  Download a "quit meter" on your computer to keep track of quit statistics, such as how long you have gone without smoking, cigarettes not smoked, and money saved.  Get a self-help book about quitting smoking and staying off tobacco. LEARN NEW SKILLS AND BEHAVIORS  Distract yourself from urges to smoke. Talk to someone, go for a walk, or occupy your time with a task.  Change your normal routine. Take a different route to work.  Drink tea instead of coffee. Eat breakfast in a different place.  Reduce your stress. Take a hot bath, exercise, or read a book.  Plan something enjoyable to do every day. Reward yourself for not smoking.  Explore interactive web-based programs that specialize in helping you quit. GET MEDICINE AND USE IT CORRECTLY Medicines can help you stop smoking and decrease the urge to smoke. Combining medicine with the above behavioral methods and support can greatly increase your chances of successfully quitting smoking.  Nicotine replacement therapy helps deliver nicotine to your body without the negative effects and risks of smoking. Nicotine replacement therapy includes nicotine gum, lozenges, inhalers, nasal sprays, and skin patches. Some may be available over-the-counter and others require a prescription.  Antidepressant medicine helps people abstain from smoking, but how this works is unknown. This medicine is available by prescription.  Nicotinic receptor partial agonist medicine simulates the effect of nicotine in your brain. This medicine is available by prescription. Ask your health care provider for advice about which medicines to use and how to use them based on your health history. Your health care provider will tell you what side effects to look out for if you choose to be on a medicine or therapy. Carefully read the information on the package. Do not use any other product containing nicotine while using a nicotine replacement product.  RELAPSE OR DIFFICULT SITUATIONS Most relapses occur within the first 3 months after quitting. Do not be discouraged if you start smoking again. Remember, most people try several times before finally quitting. You may have symptoms of withdrawal because your body is used to nicotine. You may crave cigarettes, be irritable, feel very hungry, cough often, get headaches, or have difficulty concentrating. The withdrawal symptoms are only temporary. They are strongest  when you first quit, but they will go away within 10-14 days. To reduce the chances of relapse, try to:  Avoid drinking alcohol. Drinking lowers your chances of successfully quitting.  Reduce the amount of caffeine you consume. Once you quit smoking, the amount of caffeine in your body increases and can give you symptoms, such as a rapid heartbeat, sweating, and anxiety.  Avoid smokers because they can make you want to smoke.  Do not let weight gain distract you. Many smokers will gain weight when they quit, usually less than 10 pounds. Eat a healthy diet and stay active. You can always lose the weight gained after you quit.  Find ways to improve your mood other than smoking. FOR MORE INFORMATION  www.smokefree.gov  Document Released: 03/05/2001 Document Revised: 07/26/2013 Document Reviewed: 06/20/2011 Exit[employer] Patient Information 2015 [employer]. This information is not intended to replace advice given to you by your health care provider. Make sure you discuss any questions you have with your health care provider.  

## 2014-06-10 ENCOUNTER — Emergency Department (HOSPITAL_COMMUNITY): Payer: No Typology Code available for payment source

## 2014-06-10 ENCOUNTER — Encounter (HOSPITAL_COMMUNITY): Payer: Self-pay | Admitting: Emergency Medicine

## 2014-06-10 ENCOUNTER — Emergency Department (HOSPITAL_COMMUNITY)
Admission: EM | Admit: 2014-06-10 | Discharge: 2014-06-10 | Disposition: A | Discharge status: Home / Self Care | Payer: No Typology Code available for payment source | Attending: Emergency Medicine | Admitting: Emergency Medicine

## 2014-06-10 DIAGNOSIS — R0602 Shortness of breath: Secondary | ICD-10-CM | POA: Insufficient documentation

## 2014-06-10 DIAGNOSIS — R0981 Nasal congestion: Secondary | ICD-10-CM | POA: Insufficient documentation

## 2014-06-10 DIAGNOSIS — R42 Dizziness and giddiness: Secondary | ICD-10-CM | POA: Insufficient documentation

## 2014-06-10 DIAGNOSIS — R079 Chest pain, unspecified: Secondary | ICD-10-CM | POA: Insufficient documentation

## 2014-06-10 DIAGNOSIS — M549 Dorsalgia, unspecified: Secondary | ICD-10-CM | POA: Insufficient documentation

## 2014-06-10 DIAGNOSIS — Z8781 Personal history of (healed) traumatic fracture: Secondary | ICD-10-CM | POA: Insufficient documentation

## 2014-06-10 DIAGNOSIS — I10 Essential (primary) hypertension: Secondary | ICD-10-CM | POA: Insufficient documentation

## 2014-06-10 DIAGNOSIS — Z79899 Other long term (current) drug therapy: Secondary | ICD-10-CM | POA: Insufficient documentation

## 2014-06-10 DIAGNOSIS — Z720 Tobacco use: Secondary | ICD-10-CM | POA: Insufficient documentation

## 2014-06-10 LAB — I-STAT TROPONIN, ED: TROPONIN I, POC: 0 ng/mL (ref 0.00–0.08)

## 2014-06-10 LAB — CBC WITH DIFFERENTIAL/PLATELET
Basophils Absolute: 0.1 10*3/uL (ref 0.0–0.1)
Basophils Relative: 1 % (ref 0–1)
Eosinophils Absolute: 0.2 10*3/uL (ref 0.0–0.7)
Eosinophils Relative: 2 % (ref 0–5)
HCT: 43.9 % (ref 39.0–52.0)
Hemoglobin: 15.7 g/dL (ref 13.0–17.0)
Lymphocytes Relative: 52 % — ABNORMAL HIGH (ref 12–46)
Lymphs Abs: 4.4 10*3/uL — ABNORMAL HIGH (ref 0.7–4.0)
MCH: 32 pg (ref 26.0–34.0)
MCHC: 35.8 g/dL (ref 30.0–36.0)
MCV: 89.6 fL (ref 78.0–100.0)
MONO ABS: 0.6 10*3/uL (ref 0.1–1.0)
MONOS PCT: 8 % (ref 3–12)
Neutro Abs: 3.1 10*3/uL (ref 1.7–7.7)
Neutrophils Relative %: 37 % — ABNORMAL LOW (ref 43–77)
Platelets: 218 10*3/uL (ref 150–400)
RBC: 4.9 MIL/uL (ref 4.22–5.81)
RDW: 13.3 % (ref 11.5–15.5)
WBC: 8.3 10*3/uL (ref 4.0–10.5)

## 2014-06-10 LAB — I-STAT CHEM 8, ED
BUN: 8 mg/dL (ref 6–23)
CALCIUM ION: 1.04 mmol/L — AB (ref 1.12–1.23)
CREATININE: 1 mg/dL (ref 0.50–1.35)
Chloride: 103 mmol/L (ref 96–112)
Glucose, Bld: 97 mg/dL (ref 70–99)
HCT: 51 % (ref 39.0–52.0)
Hemoglobin: 17.3 g/dL — ABNORMAL HIGH (ref 13.0–17.0)
Potassium: 3.6 mmol/L (ref 3.5–5.1)
Sodium: 139 mmol/L (ref 135–145)
TCO2: 19 mmol/L (ref 0–100)

## 2014-06-10 NOTE — ED Notes (Signed)
Pt verbally upset about the wait, states he was waiting on xray and states he is ready to remove BP cuff/pulse ox/EKG wires and go. MD notified.

## 2014-06-10 NOTE — ED Provider Notes (Signed)
CSN: [id number]     Arrival date & time 06/10/14  0038 History  This chart was scribed for Caleb MuldersScott Dayyan Krist, MD by Bronson CurbJacqueline Melvin, ED Scribe. This patient was seen in room D33C/D33C and the patient's care was started at 2:19 AM.     Chief Complaint  Patient presents with  . Chest Pain    Patient is a 39 y.o. male presenting with chest pain. The history is provided by the patient. No language interpreter was used.  Chest Pain Pain location:  Unable to specify Pain quality comment:  Squeezing Pain radiates to:  Does not radiate Pain radiates to the back: no   Pain severity:  Moderate Onset quality:  Sudden Duration:  5 hours Associated symptoms: back pain, dizziness and shortness of breath   Associated symptoms: no abdominal pain, no cough, no fever, no headache, no nausea and not vomiting      HPI Comments: Gardiner FantiJames Ardelean is a 39 y.o. male who presents to the Emergency Department complaining of intermittent, generalized, non-radiating, chest pain that began 4.5 hours ago. Patient reports a total of 2 episodes (~2 hours apart) and reports the episodes lasted approximately 10 minutes. There is associated dizziness and SOB. Patient also notes congestion and back pain in the last couple of days. He reports history of the same since November 2015 and that it is typically left sided and wakes him from sleep. He also notes "excitement" as a trigger to the chest pain. Patient states his PCP is not aware of this issue. Patient has [facility] of HT[facility]. He denies fever, chills, cough, rhinorrhea, leg swelling, visual disturbances, abdominal pain, nausea, vomiting, diarrhea, dysuria, neck pain, rash, headache, or light-headedness.    Holland CommonsKECK, VALERIE, NP at [facility]   Past Medical History  Diagnosis Date  . Fx wrist   . Hypertension   . Anxiety    History reviewed. No pertinent past surgical  history. Family History  Problem Relation Age of Onset  . Hypertension Mother   . Cancer Father [id number]    colon   History  Substance Use Topics  . Smoking status: Current Every Day Smoker  . Smokeless tobacco: Not on file     Comment: .5 ppd  . Alcohol Use: 0.0 oz/week    0 Standard drinks or equivalent per week    Review of Systems  Constitutional: Negative for fever and chills.  HENT: Positive for congestion. Negative for rhinorrhea and sore throat.   Eyes: Negative for visual disturbance.  Respiratory: Positive for shortness of breath. Negative for cough.   Cardiovascular: Positive for chest pain. Negative for leg swelling.  Gastrointestinal: Negative for nausea, vomiting, abdominal pain and diarrhea.  Genitourinary: Negative for dysuria.  Musculoskeletal: Positive for back pain. Negative for neck pain.  Skin: Negative for rash.  Neurological: Positive for dizziness. Negative for light-headedness and headaches.  Hematological: Does not bruise/bleed easily.  Psychiatric/Behavioral: Negative for confusion.      Allergies  Review of patient's allergies indicates no known allergies.  Home Medications   Prior to Admission medications   Medication Sig Start Date End Date Taking? Authorizing Provider  amLODipine (NORVASC) 10 MG tablet Take 1 tablet (10 mg total) by mouth daily. 02/22/14   Ambrose FinlandValerie A Keck, NP  erythromycin ophthalmic ointment Place a 1/2 inch ribbon of ointment into the lower eyelid every 6 hours Patient not taking: Reported on 02/21/2014 12/24/13   Rodolph BongEvan S Corey,  MD  hydrocortisone (ANUSOL-HC) 2.5 % rectal cream Apply rectally 2 times daily Patient not taking: Reported on 02/21/2014 02/19/14   Purvis Sheffield, MD  traMADol (ULTRAM) 50 MG tablet Take 1 tablet (50 mg total) by mouth every 12 (twelve) hours as needed. 04/12/14   Ambrose Finland, NP   Triage Vitals: BP 132/87 mmHg  Pulse 94  Temp(Src) 98.3 F (36.8 C) (Oral)  Resp 20  Ht  (1.88 m)  Wt 170 lb  (77.111 kg)  BMI 21.82 kg/m2  SpO2 98%  Physical Exam  Constitutional: He is oriented to person, place, and time. He appears well-developed and well-nourished. No distress.  HENT:  Head: Normocephalic and atraumatic.  Eyes: Conjunctivae and EOM are normal.  Pupils normal. Sclera clear.  Neck: Neck supple. No tracheal deviation present.  Cardiovascular: Normal rate, regular rhythm and normal heart sounds.   Pulmonary/Chest: Effort normal. No respiratory distress.  Abdominal: Soft. Bowel sounds are normal. There is no tenderness.  Musculoskeletal: Normal range of motion.  No swelling in the ankles.  Neurological: He is alert and oriented to person, place, and time. No cranial nerve deficit.  Skin: Skin is warm and dry.  Psychiatric: He has a normal mood and affect. His behavior is normal.  Nursing note and vitals reviewed.   ED Course  Procedures (including critical care time)  DIAGNOSTIC STUDIES: Oxygen Saturation is 98% on room air, normal by my interpretation.    COORDINATION OF CARE: At 0225 Discussed treatment plan with patient. Patient agrees.   Labs Review Labs Reviewed  CBC WITH DIFFERENTIAL/PLATELET - Abnormal; Notable for the following:    Neutrophils Relative % 37 (*)    Lymphocytes Relative 52 (*)    Lymphs Abs 4.4 (*)    All other components within normal limits  I-STAT CHEM 8, ED - Abnormal; Notable for the following:    Calcium, Ion 1.04 (*)    Hemoglobin 17.3 (*)    All other components within normal limits  I-STAT TROPOININ, ED    Imaging Review Dg Chest 2 View  06/10/2014   CLINICAL DATA:  Sharp chest pain of 1 day duration  EXAM: CHEST  2 VIEW  COMPARISON:  02/19/2014  FINDINGS: The heart size and mediastinal contours are within normal limits. Both lungs are clear. The visualized skeletal structures are unremarkable.  IMPRESSION: No active cardiopulmonary disease.   Electronically Signed   By: Ellery Plunk M.D.   On: 06/10/2014 02:45     EKG  Interpretation   Date/Time:  Friday June 10 2014 00:43:19 EDT Ventricular Rate:  95 PR Interval:  144 QRS Duration: 92 QT Interval:  342 QTC Calculation: 429 R Axis:   94 Text Interpretation:  Normal sinus rhythm Rightward axis Septal infarct ,  age undetermined Abnormal ECG Early repolarization When compared with ECG  of 02/19/2014, No significant change was found Confirmed by [facility]  MD,  DAVID ([id number]) on 06/10/2014 12:49:38 AM      MDM   Final diagnoses:  Chest pain, unspecified chest pain type    Patient's workup for the chest pain negative. Patient's been having chest pain for several weeks. Onset today was around 10:00 at night. Pain lasted for less than 15 minutes. Patient's never had a prolonged duration of the pain. Chest x-ray negative EKG negative for any acute changes. Troponin was negative. Follow-up with cardiology would be appropriate. Duration of symptoms not really consistent with pulmonary embolus. No hypoxia. No tachycardia. Patient is currently chest pain-free.  I  personally performed the services described in this documentation, which was scribed in my presence. The recorded information has been reviewed and is accurate.     Caleb Mulders, MD 06/10/14 [phone]

## 2014-06-10 NOTE — ED Notes (Signed)
Pt c/o central chest pain off and on for several months.  Describes pain as squeezing.  Pt denies any pain at this time.  No nausea, vomiting or shortness of breath

## 2014-06-10 NOTE — Discharge Instructions (Signed)
Workup for the chest pain now without any significant findings. The recommend follow-up with cardiology referral provided. Also follow-up with the wellness clinic.

## 2014-07-04 ENCOUNTER — Ambulatory Visit: Payer: No Typology Code available for payment source | Admitting: Internal Medicine

## 2014-07-04 ENCOUNTER — Telehealth: Payer: Self-pay | Admitting: Internal Medicine

## 2014-07-04 NOTE — Telephone Encounter (Signed)
Patient's appointment for today had to be rescheduled due to a provider emergency; please review appointment note for rescheduling options

## 2014-07-07 ENCOUNTER — Encounter: Payer: Self-pay | Admitting: Internal Medicine

## 2014-07-07 ENCOUNTER — Ambulatory Visit: Payer: No Typology Code available for payment source | Attending: Internal Medicine | Admitting: Internal Medicine

## 2014-07-07 VITALS — BP 138/90 | HR 92 | Temp 98.7°F | Resp 16 | Ht 74.0 in | Wt 175.0 lb

## 2014-07-07 DIAGNOSIS — I10 Essential (primary) hypertension: Secondary | ICD-10-CM | POA: Insufficient documentation

## 2014-07-07 DIAGNOSIS — J302 Other seasonal allergic rhinitis: Secondary | ICD-10-CM | POA: Diagnosis not present

## 2014-07-07 DIAGNOSIS — F17200 Nicotine dependence, unspecified, uncomplicated: Secondary | ICD-10-CM | POA: Insufficient documentation

## 2014-07-07 MED ORDER — AMLODIPINE BESYLATE 10 MG PO TABS: 10.0000 mg | ORAL_TABLET | Freq: Every day | ORAL | Status: DC

## 2014-07-07 MED ORDER — CETIRIZINE HCL 10 MG PO TABS: 10.0000 mg | ORAL_TABLET | Freq: Every day | ORAL | Status: DC

## 2014-07-07 MED ORDER — FLUTICASONE PROPIONATE 50 MCG/ACT NA SUSP: 2.0000 | Freq: Every day | NASAL | Status: DC

## 2014-07-07 NOTE — Patient Instructions (Signed)

## 2014-07-07 NOTE — Progress Notes (Signed)
Patient ID: Caleb Heath, male   DOB: 10/02/1975, 39 y.o.   MRN: [id number] Subjective:  Caleb Heath is a 39 y.o. male with hypertension. Current Outpatient Prescriptions  Medication Sig Dispense Refill  . amLODipine (NORVASC) 10 MG tablet Take 1 tablet (10 mg total) by mouth daily. 30 tablet 3  . diphenhydrAMINE (BENADRYL) 25 MG tablet Take 25 mg by mouth every 6 (six) hours as needed.    Marland Kitchen. erythromycin ophthalmic ointment Place a 1/2 inch ribbon of ointment into the lower eyelid every 6 hours (Patient not taking: Reported on 02/21/2014) 1 g 0  . hydrocortisone (ANUSOL-HC) 2.5 % rectal cream Apply rectally 2 times daily (Patient not taking: Reported on 02/21/2014) 28 g 0  . traMADol (ULTRAM) 50 MG tablet Take 1 tablet (50 mg total) by mouth every 12 (twelve) hours as needed. (Patient not taking: Reported on 07/07/2014) 30 tablet 0   No current facility-administered medications for this visit.    Hypertension ROS: taking medications as instructed, no medication side effects noted, no TIA's, no chest pain on exertion, no dyspnea on exertion and no swelling of ankles.  New concerns: Allergies-watery eyes, itchy eyes, dry cough, ear ache, nasal congestion. No fevers or chills.   Objective:  BP 148/85 mmHg  Pulse 92  Temp(Src) 98.7 F (37.1 C) (Oral)  Resp 16  Ht 6\' 2"  (1.88 m)  Wt 175 lb (79.379 kg)  BMI 22.46 kg/m2  SpO2 99%  Appearance alert, well appearing, and in no distress and oriented to person, place, and time. General exam S1, S2 normal, no gallop, no murmur, chest clear, no JVD, no HSM, no edema, CVS exam  - normal rate, regular rhythm, normal S1, S2, no murmurs, rubs, clicks or gallops, no JVD, BP noted to be slightly elevated today in office.  Lab review: labs are reviewed, up to date and normal.   Assessment:   Hypertension stable.   Other seasonal allergic rhinitis Orders: -     cetirizine (ZYRTEC) 10 MG tablet; Take 1 tablet (10 mg total) by mouth daily. -      fluticasone (FLONASE) 50 MCG/ACT nasal spray; Place 2 sprays into both nostrils daily   Plan:  Recommended sodium restriction. Very strongly urged to quit smoking to reduce cardiovascular risk. Copy of written low fat low cholesterol diet provided and reviewed.   Return in about 3 months (around 10/06/2014) for Hypertension.  Holland CommonsKECK, Gurjot Brisco, NP' 07/12/2014 9:45 PM

## 2014-07-07 NOTE — Progress Notes (Signed)
Pt is here following up on his HTN. Pt states that his allergy's are killing him.

## 2014-07-12 ENCOUNTER — Encounter: Payer: Self-pay | Admitting: Internal Medicine

## 2014-07-15 ENCOUNTER — Telehealth: Payer: Self-pay | Admitting: Internal Medicine

## 2014-07-15 NOTE — Telephone Encounter (Signed)
Patient called requesting dental referral . Pt states he is having pain. Please f/u with patient

## 2014-07-18 ENCOUNTER — Other Ambulatory Visit: Payer: Self-pay | Admitting: Internal Medicine

## 2014-07-18 DIAGNOSIS — K0889 Other specified disorders of teeth and supporting structures: Secondary | ICD-10-CM

## 2014-07-18 NOTE — Telephone Encounter (Signed)
I have placed referral but explain to patient that it may take 6 months or longer for him to get in with dentist if he is using the [facility] card. [facility] not have dentist with it.

## 2014-09-12 ENCOUNTER — Encounter (HOSPITAL_COMMUNITY): Payer: Self-pay

## 2014-09-12 ENCOUNTER — Emergency Department (HOSPITAL_COMMUNITY)
Admission: EM | Admit: 2014-09-12 | Discharge: 2014-09-12 | Disposition: A | Discharge status: Home / Self Care | Payer: No Typology Code available for payment source | Attending: Emergency Medicine | Admitting: Emergency Medicine

## 2014-09-12 DIAGNOSIS — K088 Other specified disorders of teeth and supporting structures: Secondary | ICD-10-CM | POA: Diagnosis not present

## 2014-09-12 DIAGNOSIS — K0889 Other specified disorders of teeth and supporting structures: Secondary | ICD-10-CM

## 2014-09-12 DIAGNOSIS — R51 Headache: Secondary | ICD-10-CM

## 2014-09-12 DIAGNOSIS — F419 Anxiety disorder, unspecified: Secondary | ICD-10-CM | POA: Insufficient documentation

## 2014-09-12 DIAGNOSIS — Z7951 Long term (current) use of inhaled steroids: Secondary | ICD-10-CM | POA: Insufficient documentation

## 2014-09-12 DIAGNOSIS — I10 Essential (primary) hypertension: Secondary | ICD-10-CM | POA: Diagnosis not present

## 2014-09-12 DIAGNOSIS — G43909 Migraine, unspecified, not intractable, without status migrainosus: Secondary | ICD-10-CM | POA: Diagnosis not present

## 2014-09-12 DIAGNOSIS — Z720 Tobacco use: Secondary | ICD-10-CM | POA: Insufficient documentation

## 2014-09-12 DIAGNOSIS — K0381 Cracked tooth: Secondary | ICD-10-CM | POA: Insufficient documentation

## 2014-09-12 DIAGNOSIS — Z79899 Other long term (current) drug therapy: Secondary | ICD-10-CM | POA: Diagnosis not present

## 2014-09-12 DIAGNOSIS — R519 Headache, unspecified: Secondary | ICD-10-CM

## 2014-09-12 HISTORY — DX: Migraine, unspecified, not intractable, without status migrainosus: G43.909

## 2014-09-12 LAB — I-STAT CHEM 8, ED
BUN: 7 mg/dL (ref 6–20)
CALCIUM ION: 1.06 mmol/L — AB (ref 1.12–1.23)
Chloride: 104 mmol/L (ref 101–111)
Creatinine, Ser: 1.1 mg/dL (ref 0.61–1.24)
Glucose, Bld: 78 mg/dL (ref 65–99)
HEMATOCRIT: 48 % (ref 39.0–52.0)
HEMOGLOBIN: 16.3 g/dL (ref 13.0–17.0)
POTASSIUM: 4 mmol/L (ref 3.5–5.1)
Sodium: 142 mmol/L (ref 135–145)
TCO2: 23 mmol/L (ref 0–100)

## 2014-09-12 MED ORDER — METOCLOPRAMIDE HCL 5 MG/ML IJ SOLN
10.0000 mg | Freq: Once | INTRAMUSCULAR | Status: AC
  Administered 2014-09-12: 10 mg via INTRAVENOUS
  Filled 2014-09-12: qty 2

## 2014-09-12 MED ORDER — NAPROXEN 250 MG PO TABS: 250.0000 mg | ORAL_TABLET | Freq: Two times a day (BID) | ORAL | Status: DC

## 2014-09-12 MED ORDER — PENICILLIN V POTASSIUM 500 MG PO TABS: 500.0000 mg | ORAL_TABLET | Freq: Four times a day (QID) | ORAL | Status: DC

## 2014-09-12 MED ORDER — SODIUM CHLORIDE 0.9 % IV BOLUS (SEPSIS)
1000.0000 mL | Freq: Once | INTRAVENOUS | Status: AC
  Administered 2014-09-12: 1000 mL via INTRAVENOUS

## 2014-09-12 MED ORDER — DIPHENHYDRAMINE HCL 50 MG/ML IJ SOLN
25.0000 mg | Freq: Once | INTRAMUSCULAR | Status: AC
  Administered 2014-09-12: 25 mg via INTRAVENOUS
  Filled 2014-09-12: qty 1

## 2014-09-12 MED ORDER — ACETAMINOPHEN 325 MG PO TABS
650.0000 mg | ORAL_TABLET | Freq: Once | ORAL | Status: AC
  Administered 2014-09-12: 650 mg via ORAL
  Filled 2014-09-12: qty 2

## 2014-09-12 NOTE — Discharge Instructions (Signed)
Dental Pain A tooth ache may be caused by cavities (tooth decay). Cavities expose the nerve of the tooth to air and hot or cold temperatures. It may come from an infection or abscess (also called a boil or furuncle) around your tooth. It is also often caused by dental caries (tooth decay). This causes the pain you are having. DIAGNOSIS  Your caregiver can diagnose this problem by exam. TREATMENT   If caused by an infection, it may be treated with medications which kill germs (antibiotics) and pain medications as prescribed by your caregiver. Take medications as directed.  Only take over-the-counter or prescription medicines for pain, discomfort, or fever as directed by your caregiver.  Whether the tooth ache today is caused by infection or dental disease, you should see your dentist as soon as possible for further care. SEEK MEDICAL CARE IF: The exam and treatment you received today has been provided on an emergency basis only. This is not a substitute for complete medical or dental care. If your problem worsens or new problems (symptoms) appear, and you are unable to meet with your dentist, call or return to this location. SEEK IMMEDIATE MEDICAL CARE IF:   You have a fever.  You develop redness and swelling of your face, jaw, or neck.  You are unable to open your mouth.  You have severe pain uncontrolled by pain medicine. MAKE SURE YOU:   Understand these instructions.  Will watch your condition.  Will get help right away if you are not doing well or get worse. Document Released: 03/11/2005 Document Revised: 06/03/2011 Document Reviewed: 10/28/2007 [facility] Patient Information 2015 [place], [place]. This information is not intended to replace advice given to you by your health care provider. Make sure you discuss any questions you have with your health care provider. General Headache Without Cause A headache is pain or discomfort felt around the head or neck area. The specific cause  of a headache may not be found. There are many causes and types of headaches. A few common ones are:  Tension headaches.  Migraine headaches.  Cluster headaches.  Chronic daily headaches. HOME CARE INSTRUCTIONS   Keep all follow-up appointments with your caregiver or any specialist referral.  Only take over-the-counter or prescription medicines for pain or discomfort as directed by your caregiver.  Lie down in a dark, quiet room when you have a headache.  Keep a headache journal to find out what may trigger your migraine headaches. For example, write down:  What you eat and drink.  How much sleep you get.  Any change to your diet or medicines.  Try massage or other relaxation techniques.  Put ice packs or heat on the head and neck. Use these 3 to 4 times per day for 15 to 20 minutes each time, or as needed.  Limit stress.  Sit up straight, and do not tense your muscles.  Quit smoking if you smoke.  Limit alcohol use.  Decrease the amount of caffeine you drink, or stop drinking caffeine.  Eat and sleep on a regular schedule.  Get 7 to 9 hours of sleep, or as recommended by your caregiver.  Keep lights dim if bright lights bother you and make your headaches worse. SEEK MEDICAL CARE IF:   You have problems with the medicines you were prescribed.  Your medicines are not working.  You have a change from the usual headache.  You have nausea or vomiting. SEEK IMMEDIATE MEDICAL CARE IF:   Your headache becomes severe.  You have a fever.  You have a stiff neck.  You have loss of vision.  You have muscular weakness or loss of muscle control.  You start losing your balance or have trouble walking.  You feel faint or pass out.  You have severe symptoms that are different from your first symptoms. MAKE SURE YOU:   Understand these instructions.  Will watch your condition.  Will get help right away if you are not doing well or get worse. Document  Released: 03/11/2005 Document Revised: 06/03/2011 Document Reviewed: 03/27/2011 [facility] Patient Information 2015 [place], [place]. This information is not intended to replace advice given to you by your health care provider. Make sure you discuss any questions you have with your health care provider.

## 2014-09-12 NOTE — ED Notes (Signed)
Pt reports a headache that began at 0645 this morning.  Pt reports a hx of migraines.  Pt denies any n/v, photosensitivity or sensitivity to sound.  Pt reports the pain began on his drive to work.

## 2014-09-12 NOTE — ED Provider Notes (Signed)
CSN: [id number]     Arrival date & time 09/12/14  1125 History   First MD Initiated Contact with Patient 09/12/14 1148     Chief Complaint  Patient presents with  . Headache   Caleb Heath is a 39 y.o. male with a history of hypertension and migraines who presents to the emergency department complaining of a migraine headache and left sided dental pain since last night. The patient reports that he has had a left-sided frontal headache since 6:45 AM this morning. He reports his pain is 8 out of 10 and describes it as throbbing. He reports this feels like his typical migraine headache. He reports taking Aleve at 9 AM with some relief. Patient also reports left lower dental pain since last night. He reports having a broken tooth. Patient believes his headache stems from his left dental pain. The patient denies any discharge from his mouth, fevers, sore throat or difficulty swallowing. Patient also reports he drank a 12 pack of beer yesterday. He reports feeling lightheaded with position changes. The patient denies fevers, chills, eye pain, ear pain, changes to his vision, abdominal pain, nausea, vomiting, numbness, tingling, weakness, rashes, chest pain or palpitations.  (Consider location/radiation/quality/duration/timing/severity/associated sxs/prior Treatment) HPI  Past Medical History  Diagnosis Date  . Fx wrist   . Hypertension   . Anxiety   . Migraine    History reviewed. No pertinent past surgical history. Family History  Problem Relation Age of Onset  . Hypertension Mother   . Cancer Father 72    colon   History  Substance Use Topics  . Smoking status: Current Every Day Smoker  . Smokeless tobacco: Not on file     Comment: .5 ppd  . Alcohol Use: 0.0 oz/week    0 Standard drinks or equivalent per week    Review of Systems  Constitutional: Negative for fever and chills.  HENT: Positive for dental problem. Negative for congestion, ear pain, hearing loss, mouth sores, sore  throat and trouble swallowing.   Eyes: Negative for pain and visual disturbance.  Respiratory: Negative for cough, shortness of breath and wheezing.   Cardiovascular: Negative for chest pain and palpitations.  Gastrointestinal: Negative for nausea, vomiting, abdominal pain and diarrhea.  Genitourinary: Negative for dysuria, hematuria and difficulty urinating.  Musculoskeletal: Negative for back pain and neck pain.  Skin: Negative for rash.  Neurological: Positive for light-headedness and headaches. Negative for dizziness, syncope, speech difficulty, weakness and numbness.      Allergies  Review of patient's allergies indicates no known allergies.  Home Medications   Prior to Admission medications   Medication Sig Start Date End Date Taking? Authorizing Provider  amLODipine (NORVASC) 10 MG tablet Take 1 tablet (10 mg total) by mouth daily. 07/07/14  Yes Ambrose Finland, NP  cetirizine (ZYRTEC) 10 MG tablet Take 1 tablet (10 mg total) by mouth daily. 07/07/14  Yes Ambrose Finland, NP  fluticasone (FLONASE) 50 MCG/ACT nasal spray Place 2 sprays into both nostrils daily. 07/07/14  Yes Ambrose Finland, NP  erythromycin ophthalmic ointment Place a 1/2 inch ribbon of ointment into the lower eyelid every 6 hours Patient not taking: Reported on 02/21/2014 12/24/13   Rodolph Bong, MD  hydrocortisone (ANUSOL-HC) 2.5 % rectal cream Apply rectally 2 times daily Patient not taking: Reported on 02/21/2014 02/19/14   Purvis Sheffield, MD  naproxen (NAPROSYN) 250 MG tablet Take 1 tablet (250 mg total) by mouth 2 (two) times daily with a meal. 09/12/14  Everlene Farrier, PA-C  penicillin v potassium (VEETID) 500 MG tablet Take 1 tablet (500 mg total) by mouth 4 (four) times daily. 09/12/14   Everlene Farrier, PA-C  traMADol (ULTRAM) 50 MG tablet Take 1 tablet (50 mg total) by mouth every 12 (twelve) hours as needed. Patient not taking: Reported on 07/07/2014 04/12/14   Ambrose Finland, NP   BP 108/66 mmHg  Pulse 88   Temp(Src) 98 F (36.7 C) (Oral)  Resp 21  Ht  (1.88 m)  Wt 175 lb (79.379 kg)  BMI 22.46 kg/m2  SpO2 96% Physical Exam  Constitutional: He is oriented to person, place, and time. He appears well-developed and well-nourished. No distress.  Nontoxic appearing.  HENT:  Head: Normocephalic and atraumatic.  Right Ear: External ear normal.  Left Ear: External ear normal.  Mouth/Throat: Oropharynx is clear and moist. No oropharyngeal exudate.  Patient has generally poor dentition. There is a cracked left lower molar. No gross abscess or discharge from the mouth. No area of induration or facial swelling. Uvula is midline without edema. Soft palate rises symmetrically. No temporal edema or tenderness.  Eyes: Conjunctivae and EOM are normal. Pupils are equal, round, and reactive to light. Right eye exhibits no discharge. Left eye exhibits no discharge.  EOMs are intact. No nystagmus.  Neck: Normal range of motion. Neck supple. No JVD present. No tracheal deviation present.  No meningeal signs.  Cardiovascular: Normal rate, regular rhythm, normal heart sounds and intact distal pulses.  Exam reveals no gallop and no friction rub.   No murmur heard. Pulmonary/Chest: Effort normal and breath sounds normal. No respiratory distress. He has no wheezes. He has no rales.  Abdominal: Soft. Bowel sounds are normal. He exhibits no distension. There is no tenderness. There is no guarding.  Musculoskeletal: He exhibits no edema.  Lymphadenopathy:    He has no cervical adenopathy.  Neurological: He is alert and oriented to person, place, and time. No cranial nerve deficit. Coordination normal.  The patient is alert and oriented 3. Cranial nerves are intact. No pronator drift. Sensation intact in his bilateral upper and lower extremities. Strength is 5 out of 5 in his bilateral upper and lower extremities.  Skin: Skin is warm and dry. No rash noted. He is not diaphoretic. No erythema. No pallor.   Psychiatric: He has a normal mood and affect. His behavior is normal.  Nursing note and vitals reviewed.   ED Course  Procedures (including critical care time) Labs Review Labs Reviewed  I-STAT CHEM 8, ED - Abnormal; Notable for the following:    Calcium, Ion 1.06 (*)    All other components within normal limits    Imaging Review No results found.   EKG Interpretation   Date/Time:  Monday September 12 2014 11:34:14 EDT Ventricular Rate:  103 PR Interval:  161 QRS Duration: 97 QT Interval:  340 QTC Calculation: 445 R Axis:   84 Text Interpretation:  Sinus tachycardia Probable left ventricular  hypertrophy Lateral infarct, old Anterior Q waves, possibly due to LVH  similar to previous Confirmed by ZAVITZ  MD, JOSHUA ([id number]) on 09/12/2014  11:39:42 AM      Filed Vitals:   09/12/14 1215 09/12/14 1230 09/12/14 1300 09/12/14 1315  BP: 139/88 132/90 124/79 108/66  Pulse: 99 91 97 88  Temp:      TempSrc:      Resp: Height:      Weight:      SpO2: 99%  96% 97% 96%     MDM   Meds given in ED:  Medications  sodium chloride 0.9 % bolus 1,000 mL (1,000 mLs Intravenous New Bag/Given 09/12/14 1238)  metoCLOPramide (REGLAN) injection 10 mg (10 mg Intravenous Given 09/12/14 1242)  diphenhydrAMINE (BENADRYL) injection 25 mg (25 mg Intravenous Given 09/12/14 1240)  acetaminophen (TYLENOL) tablet 650 mg (650 mg Oral Given 09/12/14 1238)    New Prescriptions   NAPROXEN (NAPROSYN) 250 MG TABLET    Take 1 tablet (250 mg total) by mouth 2 (two) times daily with a meal.   PENICILLIN V POTASSIUM (VEETID) 500 MG TABLET    Take 1 tablet (500 mg total) by mouth 4 (four) times daily.    Final diagnoses:  Bad headache  Pain, dental   This is a 39 year old male who presented to the emergency department complaining of left dental pain and a left-sided headache which he describes as a migraine since 645 this morning. Patient leaves his headache is possibly due to his dental pain.   Patient with left lower toothache.  No gross abscess.  Exam unconcerning for Ludwig's angina or spread of infection.  Will treat with penicillin and pain medicine.  Urged patient to follow-up with dentist Dr. Russella Dar.  Patient's HA treated and resolved while in ED with Tylenol, fluid bolus, Benadryl and Reglan.  Presentation is like pts typical HA and non concerning for [facility], ICH, Meningitis, or temporal arteritis. Pt is afebrile with no focal neuro deficits, nuchal rigidity, or change in vision. Pt is to follow up with PCP to discuss prophylactic medication. I advised the patient to follow-up with their primary care provider this week. I advised the patient to return to the emergency department with new or worsening symptoms or new concerns. The patient verbalized understanding and agreement with plan.       Everlene Farrier, PA-C 09/12/14 1351  Blane Ohara, MD 09/13/14 [phone]

## 2014-09-16 ENCOUNTER — Encounter: Payer: Self-pay | Admitting: Internal Medicine

## 2014-09-16 ENCOUNTER — Ambulatory Visit: Payer: No Typology Code available for payment source | Attending: Internal Medicine | Admitting: Internal Medicine

## 2014-09-16 VITALS — BP 112/81 | HR 100 | Temp 98.1°F | Resp 16 | Wt 174.2 lb

## 2014-09-16 DIAGNOSIS — K0889 Other specified disorders of teeth and supporting structures: Secondary | ICD-10-CM

## 2014-09-16 DIAGNOSIS — K088 Other specified disorders of teeth and supporting structures: Secondary | ICD-10-CM | POA: Diagnosis not present

## 2014-09-16 NOTE — Progress Notes (Signed)
Patient ID: Caleb Heath, male   DOB: 10-01-1975, 39 y.o.   MRN: [id number]  CC: dental pain follow up  HPI: Caleb Heath is a 39 y.o. male here today for a follow up visit.  Patient has past medical history of hypertension. Patient was seen on seen on 6/20 for a abscessed tooth. He later went for a tooth extraction. He reports that he was given penicillin V for infection but has been unable to afford the medication. He continues to have pain and swelling were tooth was extracted. He denies fever, chills, redness, or headaches. Headaches imporved after removal of tooth.   Patient has No headache, No chest pain, No abdominal pain - No Nausea, No new weakness tingling or numbness, No Cough - SOB.  No Known Allergies Past Medical History  Diagnosis Date  . Fx wrist   . Hypertension   . Anxiety   . Migraine    Current Outpatient Prescriptions on File Prior to Visit  Medication Sig Dispense Refill  . amLODipine (NORVASC) 10 MG tablet Take 1 tablet (10 mg total) by mouth daily. 30 tablet 6  . cetirizine (ZYRTEC) 10 MG tablet Take 1 tablet (10 mg total) by mouth daily. 30 tablet 11  . erythromycin ophthalmic ointment Place a 1/2 inch ribbon of ointment into the lower eyelid every 6 hours 1 g 0  . fluticasone (FLONASE) 50 MCG/ACT nasal spray Place 2 sprays into both nostrils daily. 16 g 6  . hydrocortisone (ANUSOL-HC) 2.5 % rectal cream Apply rectally 2 times daily 28 g 0  . traMADol (ULTRAM) 50 MG tablet Take 1 tablet (50 mg total) by mouth every 12 (twelve) hours as needed. 30 tablet 0  . naproxen (NAPROSYN) 250 MG tablet Take 1 tablet (250 mg total) by mouth 2 (two) times daily with a meal. (Patient not taking: Reported on 09/16/2014) 30 tablet 0  . penicillin v potassium (VEETID) 500 MG tablet Take 1 tablet (500 mg total) by mouth 4 (four) times daily. (Patient not taking: Reported on 09/16/2014) 40 tablet 0   No current facility-administered medications on file prior to visit.   Family  History  Problem Relation Age of Onset  . Hypertension Mother   . Cancer Father 13    colon   History   Social History  . Marital Status: Single    Spouse Name: N/A  . Number of Children: N/A  . Years of Education: N/A   Occupational History  . Not on file.   Social History Main Topics  . Smoking status: Current Every Day Smoker  . Smokeless tobacco: Not on file     Comment: .5 ppd  . Alcohol Use: 0.0 oz/week    0 Standard drinks or equivalent per week  . Drug Use: No  . Sexual Activity: Not on file   Other Topics Concern  . Not on file   Social History Narrative    Review of Systems: See HPI   Objective:   Filed Vitals:   09/16/14 1431  BP: 112/81  Pulse: 100  Temp: 98.1 F (36.7 C)  Resp: 16    Physical Exam: No redness of jaw or warmth. Minimal swelling at extraction site  Lab Results  Component Value Date   WBC 8.3 06/10/2014   HGB 16.3 09/12/2014   HCT 48.0 09/12/2014   MCV 89.6 06/10/2014   PLT 218 06/10/2014   Lab Results  Component Value Date   CREATININE 1.10 09/12/2014   BUN 7 09/12/2014   NA  142 09/12/2014   K 4.0 09/12/2014   CL 104 09/12/2014   CO2 22 02/19/2014    No results found for: HGBA1C Lipid Panel     Component Value Date/Time   CHOL 180 10/26/2013 0929   TRIG 42 10/26/2013 0929   HDL 97 10/26/2013 0929   CHOLHDL 1.9 10/26/2013 0929   VLDL 8 10/26/2013 0929   LDLCALC 75 10/26/2013 0929       Assessment and plan:   [name] was seen today for follow-up.  Diagnoses and all orders for this visit:  Pain, dental I have advised patient to get medications from out pharmacy and ask for them to be credited to his account. Pharmacy has agreed to give patient his medication today. Warm salt water gargles advised as well. Return precautions advised   Return if symptoms worsen or fail to improve.       Holland Commons, NP-C [facility] [phone] 09/16/2014, 2:40 PM

## 2014-09-16 NOTE — Progress Notes (Signed)
Pt present today for follow up from ER for tooth pain and headache since then the abcess tooth has been pulled. He was prescribed penicillin but has not been able to pick up the rx due to lack of money.

## 2014-11-01 ENCOUNTER — Emergency Department (HOSPITAL_COMMUNITY)
Admission: EM | Admit: 2014-11-01 | Discharge: 2014-11-01 | Disposition: A | Discharge status: Home / Self Care | Payer: No Typology Code available for payment source | Attending: Emergency Medicine | Admitting: Emergency Medicine

## 2014-11-01 ENCOUNTER — Encounter (HOSPITAL_COMMUNITY): Payer: Self-pay | Admitting: Emergency Medicine

## 2014-11-01 DIAGNOSIS — F419 Anxiety disorder, unspecified: Secondary | ICD-10-CM | POA: Insufficient documentation

## 2014-11-01 DIAGNOSIS — Z79899 Other long term (current) drug therapy: Secondary | ICD-10-CM | POA: Insufficient documentation

## 2014-11-01 DIAGNOSIS — G43909 Migraine, unspecified, not intractable, without status migrainosus: Secondary | ICD-10-CM | POA: Insufficient documentation

## 2014-11-01 DIAGNOSIS — I10 Essential (primary) hypertension: Secondary | ICD-10-CM | POA: Diagnosis not present

## 2014-11-01 DIAGNOSIS — R519 Headache, unspecified: Secondary | ICD-10-CM

## 2014-11-01 DIAGNOSIS — Z8781 Personal history of (healed) traumatic fracture: Secondary | ICD-10-CM | POA: Insufficient documentation

## 2014-11-01 DIAGNOSIS — Z720 Tobacco use: Secondary | ICD-10-CM | POA: Insufficient documentation

## 2014-11-01 DIAGNOSIS — Z7951 Long term (current) use of inhaled steroids: Secondary | ICD-10-CM | POA: Diagnosis not present

## 2014-11-01 DIAGNOSIS — R51 Headache: Secondary | ICD-10-CM

## 2014-11-01 MED ORDER — MAGNESIUM SULFATE 2 GM/50ML IV SOLN
2.0000 g | Freq: Once | INTRAVENOUS | Status: AC
  Administered 2014-11-01: 2 g via INTRAVENOUS
  Filled 2014-11-01: qty 50

## 2014-11-01 MED ORDER — METOCLOPRAMIDE HCL 5 MG/ML IJ SOLN
10.0000 mg | Freq: Once | INTRAMUSCULAR | Status: AC
  Administered 2014-11-01: 10 mg via INTRAVENOUS
  Filled 2014-11-01: qty 2

## 2014-11-01 MED ORDER — DIPHENHYDRAMINE HCL 50 MG/ML IJ SOLN
50.0000 mg | Freq: Once | INTRAMUSCULAR | Status: AC
  Administered 2014-11-01: 50 mg via INTRAVENOUS
  Filled 2014-11-01: qty 1

## 2014-11-01 NOTE — Discharge Instructions (Signed)

## 2014-11-01 NOTE — ED Provider Notes (Signed)
CSN: [id number]     Arrival date & time 11/01/14  0700 History   First MD Initiated Contact with Patient 11/01/14 [phone]     Chief Complaint  Patient presents with  . Hypertension  . Headache     (Consider location/radiation/quality/duration/timing/severity/associated sxs/prior Treatment) HPI Caleb Heath is a 39 y.o. male who comes in for evaluation of high blood pressure and headache. Patient states he woke up this morning with a gradual onset headache similar to previous headaches. Headache is dull and throbbing in nature and is located "around my whole head". He reports his headache may be due to his elevated blood pressure. Reports he has taken his blood pressure medicine today. Reports he was unable to go to work today because of his headache. He has not done anything to improve his symptoms. He denies fevers, chills, vision changes, neck stiffness or pain, numbness or weakness, rash.  Past Medical History  Diagnosis Date  . Fx wrist   . Hypertension   . Anxiety   . Migraine    History reviewed. No pertinent past surgical history. Family History  Problem Relation Age of Onset  . Hypertension Mother   . Cancer Father 35    colon   History  Substance Use Topics  . Smoking status: Current Every Day Smoker  . Smokeless tobacco: Not on file     Comment: .5 ppd  . Alcohol Use: 0.0 oz/week    0 Standard drinks or equivalent per week    Review of Systems A 10 point review of systems was completed and was negative except for pertinent positives and negatives as mentioned in the history of present illness     Allergies  Review of patient's allergies indicates no known allergies.  Home Medications   Prior to Admission medications   Medication Sig Start Date End Date Taking? Authorizing Provider  amLODipine (NORVASC) 10 MG tablet Take 1 tablet (10 mg total) by mouth daily. 07/07/14  Yes Ambrose Finland, NP  cetirizine (ZYRTEC) 10 MG tablet Take 1 tablet (10 mg total) by mouth  daily. Patient not taking: Reported on 11/01/2014 07/07/14   Ambrose Finland, NP  erythromycin ophthalmic ointment Place a 1/2 inch ribbon of ointment into the lower eyelid every 6 hours Patient not taking: Reported on 11/01/2014 12/24/13   Rodolph Bong, MD  fluticasone [facility]) 50 MCG/ACT nasal spray Place 2 sprays into both nostrils daily. Patient not taking: Reported on 11/01/2014 07/07/14   Ambrose Finland, NP  hydrocortisone (ANUSOL-HC) 2.5 % rectal cream Apply rectally 2 times daily Patient not taking: Reported on 11/01/2014 02/19/14   Purvis Sheffield, MD  naproxen (NAPROSYN) 250 MG tablet Take 1 tablet (250 mg total) by mouth 2 (two) times daily with a meal. Patient not taking: Reported on 09/16/2014 09/12/14   Everlene Farrier, PA-C  traMADol (ULTRAM) 50 MG tablet Take 1 tablet (50 mg total) by mouth every 12 (twelve) hours as needed. Patient not taking: Reported on 11/01/2014 04/12/14   Ambrose Finland, NP   BP 133/82 mmHg  Pulse 86  Temp(Src) 98.2 F (36.8 C) (Oral)  Resp 18  SpO2 96% Physical Exam  Constitutional: He is oriented to person, place, and time. He appears well-developed and well-nourished.  HENT:  Head: Normocephalic and atraumatic.  Mouth/Throat: Oropharynx is clear and moist.  Eyes: Conjunctivae are normal. Pupils are equal, round, and reactive to light. Right eye exhibits no discharge. Left eye exhibits no discharge. No scleral icterus.  Neck: Normal range  of motion. Neck supple.  No meningismus or nuchal rigidity  Cardiovascular: Normal rate, regular rhythm and normal heart sounds.   Pulmonary/Chest: Effort normal and breath sounds normal. No respiratory distress. He has no wheezes. He has no rales.  Abdominal: Soft. There is no tenderness.  Musculoskeletal: He exhibits no tenderness.  Neurological: He is alert and oriented to person, place, and time.  Cranial Nerves II-XII grossly intact. Moves all extremities without ataxia. Gait is baseline.  Skin: Skin is warm and dry. No  rash noted.  Psychiatric: He has a normal mood and affect.  Nursing note and vitals reviewed.   ED Course  Procedures (including critical care time) Labs Review Labs Reviewed - No data to display  Imaging Review No results found.   EKG Interpretation   Date/Time:  Tuesday November 01 2014 07:20:24 EDT Ventricular Rate:  104 PR Interval:  156 QRS Duration: 95 QT Interval:  341 QTC Calculation: 448 R Axis:   95 Text Interpretation:  Sinus tachycardia Anterior infarct, old No  significant change since last tracing Confirmed by [facility]  MD, WHITNEY  [id number]) on 11/01/2014 7:50:00 AM     Meds given in ED:  Medications  metoCLOPramide (REGLAN) injection 10 mg (10 mg Intravenous Given 11/01/14 0754)  diphenhydrAMINE (BENADRYL) injection 50 mg (50 mg Intravenous Given 11/01/14 0751)  magnesium sulfate IVPB 2 g 50 mL (0 g Intravenous Stopped 11/01/14 0904)    Discharge Medication List as of 11/01/2014  8:50 AM     Filed Vitals:   11/01/14 0800 11/01/14 0815 11/01/14 0830 11/01/14 0845  BP: 155/91 132/77 129/72 133/82  Pulse: 99 87 86 86  Temp:      TempSrc:      Resp: 19 19 19 18   SpO2: 97% 96% 94% 96%    MDM  Vitals stable - WNL -afebrile Pt resting comfortably in ED. Physical exam is grossly benign, normal neuro exam. Gait is baseline. No rash. No meningismus or nuchal rigidity.  No evidence of SAH, other ICH. No concern for meningitis or other encephalopathy. Symptoms have resolved after administration of headache cocktail. No evidence of other acute or emergent pathology at this time. Patient requests note for work. I discussed all relevant lab findings and imaging results with pt and they verbalized understanding. Discussed f/u with PCP within 48 hrs and return precautions, pt very amenable to plan. Patient stable, in good condition and ambulates out of ED without difficulty.  Final diagnoses:  Nonintractable headache, unspecified chronicity pattern, unspecified headache  type       Joycie Peek, PA-C 11/01/14 1556  Gwyneth Sprout, MD 11/07/14 2130

## 2014-11-01 NOTE — ED Notes (Signed)
Patient is alert and oriented at discharge.  Patient ambulatory to the waiting room with this RN.

## 2014-11-01 NOTE — ED Notes (Signed)
Pt. Stated, my pressure is up and I have a headache , I couldn't even work today.

## 2014-11-09 ENCOUNTER — Encounter: Payer: Self-pay | Admitting: Internal Medicine

## 2014-11-09 ENCOUNTER — Ambulatory Visit: Payer: No Typology Code available for payment source | Attending: Internal Medicine | Admitting: Internal Medicine

## 2014-11-09 VITALS — BP 126/84 | HR 79 | Temp 98.4°F | Resp 18 | Ht 74.0 in | Wt 170.0 lb

## 2014-11-09 DIAGNOSIS — R519 Headache, unspecified: Secondary | ICD-10-CM

## 2014-11-09 DIAGNOSIS — R51 Headache: Secondary | ICD-10-CM | POA: Insufficient documentation

## 2014-11-09 MED ORDER — TRAMADOL HCL 50 MG PO TABS: 50.0000 mg | ORAL_TABLET | Freq: Two times a day (BID) | ORAL | Status: DC | PRN

## 2014-11-09 NOTE — Progress Notes (Signed)
Patient ID: Caleb Heath, male   DOB: December 17, 1975, 39 y.o.   MRN: [id number]  CC: headaches   HPI: Caleb Heath is a 39 y.o. male here today for a follow up visit.  Patient has past medical history of HTN, migraines, and anxiety.  Patient went to the ER 8 days ago for complaints of headaches. He reports that he suffered from the headaches 2-3 days ago. The headaches were throbbing, located behind his eyes and in his temples. He denies nausea, vomiting. Some sensitivity to light. Sleeping usually helps the headache. He denies missing blood pressure pills. He notes that the headaches were similar to his past headaches but has not had one since leaving the ER.     No Known Allergies Past Medical History  Diagnosis Date  . Fx wrist   . Hypertension   . Anxiety   . Migraine    Current Outpatient Prescriptions on File Prior to Visit  Medication Sig Dispense Refill  . amLODipine (NORVASC) 10 MG tablet Take 1 tablet (10 mg total) by mouth daily. 30 tablet 6  . cetirizine (ZYRTEC) 10 MG tablet Take 1 tablet (10 mg total) by mouth daily. (Patient not taking: Reported on 11/01/2014) 30 tablet 11  . erythromycin ophthalmic ointment Place a 1/2 inch ribbon of ointment into the lower eyelid every 6 hours (Patient not taking: Reported on 11/01/2014) 1 g 0  . fluticasone (FLONASE) 50 MCG/ACT nasal spray Place 2 sprays into both nostrils daily. (Patient not taking: Reported on 11/01/2014) 16 g 6  . hydrocortisone (ANUSOL-HC) 2.5 % rectal cream Apply rectally 2 times daily (Patient not taking: Reported on 11/01/2014) 28 g 0  . naproxen (NAPROSYN) 250 MG tablet Take 1 tablet (250 mg total) by mouth 2 (two) times daily with a meal. (Patient not taking: Reported on 09/16/2014) 30 tablet 0  . traMADol (ULTRAM) 50 MG tablet Take 1 tablet (50 mg total) by mouth every 12 (twelve) hours as needed. (Patient not taking: Reported on 11/01/2014) 30 tablet 0   No current facility-administered medications on file prior to visit.    Family History  Problem Relation Age of Onset  . Hypertension Mother   . Cancer Father 24    colon   Social History   Social History  . Marital Status: Single    Spouse Name: N/A  . Number of Children: N/A  . Years of Education: N/A   Occupational History  . Not on file.   Social History Main Topics  . Smoking status: Current Every Day Smoker -- 0.50 packs/day    Types: Cigarettes  . Smokeless tobacco: Not on file     Comment: .5 ppd  . Alcohol Use: 7.2 oz/week    0 Standard drinks or equivalent, 12 Cans of beer per week     Comment: weekend  . Drug Use: No  . Sexual Activity: Not on file   Other Topics Concern  . Not on file   Social History Narrative    Review of Systems  Eyes: Positive for photophobia.  Gastrointestinal: Negative for nausea, vomiting and abdominal pain.  Neurological: Positive for headaches. Negative for dizziness, tingling and focal weakness.      Objective:   Filed Vitals:   11/09/14 1418  BP: 126/84  Pulse: 79  Temp: 98.4 F (36.9 C)  Resp: 18    Physical Exam  Constitutional: He is oriented to person, place, and time.  Cardiovascular: Normal rate, regular rhythm and normal heart sounds.   Pulmonary/Chest: Effort  normal and breath sounds normal.  Musculoskeletal: He exhibits no edema.  Neurological: He is alert and oriented to person, place, and time. No cranial nerve deficit.  Skin: Skin is warm and dry.     Lab Results  Component Value Date   WBC 8.3 06/10/2014   HGB 16.3 09/12/2014   HCT 48.0 09/12/2014   MCV 89.6 06/10/2014   PLT 218 06/10/2014   Lab Results  Component Value Date   CREATININE 1.10 09/12/2014   BUN 7 09/12/2014   NA 142 09/12/2014   K 4.0 09/12/2014   CL 104 09/12/2014   CO2 22 02/19/2014    No results found for: HGBA1C Lipid Panel     Component Value Date/Time   CHOL 180 10/26/2013 0929   TRIG 42 10/26/2013 0929   HDL 97 10/26/2013 0929   CHOLHDL 1.9 10/26/2013 0929   VLDL 8  10/26/2013 0929   LDLCALC 75 10/26/2013 0929       Assessment and plan:   [name] was seen today for headache.  Diagnoses and all orders for this visit:  Bilateral headaches -     traMADol (ULTRAM) 50 MG tablet; Take 1 tablet (50 mg total) by mouth every 12 (twelve) hours as needed for severe pain. Patient reports one headache per week, will give to help as needed. If he begins to have headaches more frequently he will return with a diary and I will consider adding a preventative med  Return in about 3 months (around 02/09/2015), or if symptoms worsen or fail to improve, for Hypertension.        Ambrose Finland, NP-C [facility] [phone] 11/09/2014, 2:35 PM

## 2014-11-09 NOTE — Progress Notes (Signed)
Pt's here for ED F/up for HA's x1wk ago. HA's lasted 2-3 days, but stopped . Pt reports no HA's today. Pt has taken meds today. 0 pain level reported.

## 2015-01-04 ENCOUNTER — Telehealth: Payer: Self-pay | Admitting: Internal Medicine

## 2015-01-04 NOTE — Telephone Encounter (Signed)
Patient called stating that he thinks his blood pressure med is bothering him. Pt. stated he has been having serious stomach pains. Please f/u with pt.

## 2015-01-05 ENCOUNTER — Telehealth: Payer: Self-pay

## 2015-01-05 NOTE — Telephone Encounter (Signed)
Returned patient phone call Patient states he has been having some lower abd pain Not urinating as frequent as he usually does Explained it could be a kidney stone or something else Would have to make an appt to be evaluated Call transferred to front office to schedule an appt

## 2015-01-12 ENCOUNTER — Ambulatory Visit: Payer: No Typology Code available for payment source | Attending: Internal Medicine | Admitting: Internal Medicine

## 2015-01-12 ENCOUNTER — Encounter: Payer: Self-pay | Admitting: Internal Medicine

## 2015-01-12 VITALS — BP 148/98 | HR 80 | Temp 98.0°F | Resp 16 | Ht 74.0 in | Wt 169.6 lb

## 2015-01-12 DIAGNOSIS — I10 Essential (primary) hypertension: Secondary | ICD-10-CM | POA: Diagnosis not present

## 2015-01-12 DIAGNOSIS — R109 Unspecified abdominal pain: Secondary | ICD-10-CM

## 2015-01-12 DIAGNOSIS — Z8249 Family history of ischemic heart disease and other diseases of the circulatory system: Secondary | ICD-10-CM | POA: Diagnosis not present

## 2015-01-12 DIAGNOSIS — R319 Hematuria, unspecified: Secondary | ICD-10-CM | POA: Diagnosis not present

## 2015-01-12 DIAGNOSIS — Z800 Family history of malignant neoplasm of digestive organs: Secondary | ICD-10-CM | POA: Insufficient documentation

## 2015-01-12 DIAGNOSIS — Z0000 Encounter for general adult medical examination without abnormal findings: Secondary | ICD-10-CM

## 2015-01-12 DIAGNOSIS — F17210 Nicotine dependence, cigarettes, uncomplicated: Secondary | ICD-10-CM | POA: Diagnosis not present

## 2015-01-12 DIAGNOSIS — R519 Headache, unspecified: Secondary | ICD-10-CM

## 2015-01-12 DIAGNOSIS — R51 Headache: Secondary | ICD-10-CM | POA: Insufficient documentation

## 2015-01-12 DIAGNOSIS — Z79899 Other long term (current) drug therapy: Secondary | ICD-10-CM | POA: Diagnosis not present

## 2015-01-12 DIAGNOSIS — R195 Other fecal abnormalities: Secondary | ICD-10-CM | POA: Diagnosis not present

## 2015-01-12 DIAGNOSIS — Z23 Encounter for immunization: Secondary | ICD-10-CM | POA: Insufficient documentation

## 2015-01-12 LAB — POCT URINALYSIS DIPSTICK
Bilirubin, UA: NEGATIVE
Glucose, UA: NEGATIVE
Ketones, UA: NEGATIVE
Leukocytes, UA: NEGATIVE
NITRITE UA: NEGATIVE
PROTEIN UA: NEGATIVE
SPEC GRAV UA: 1.01
UROBILINOGEN UA: 0.2
pH, UA: 7.5

## 2015-01-12 LAB — CBC
HEMATOCRIT: 43.2 % (ref 39.0–52.0)
Hemoglobin: 14.6 g/dL (ref 13.0–17.0)
MCH: 30.4 pg (ref 26.0–34.0)
MCHC: 33.8 g/dL (ref 30.0–36.0)
MCV: 89.8 fL (ref 78.0–100.0)
MPV: 10.8 fL (ref 8.6–12.4)
PLATELETS: 283 10*3/uL (ref 150–400)
RBC: 4.81 MIL/uL (ref 4.22–5.81)
RDW: 13.6 % (ref 11.5–15.5)
WBC: 7.9 10*3/uL (ref 4.0–10.5)

## 2015-01-12 MED ORDER — TRAMADOL HCL 50 MG PO TABS: 50.0000 mg | ORAL_TABLET | Freq: Two times a day (BID) | ORAL | Status: DC | PRN

## 2015-01-12 NOTE — Patient Instructions (Signed)
I will call you with results of ultrasound to see if you have kidney stones, if not then I will send you to a urologist for additional testing   GI will call you soon for a appointment. Please make sure you go to this appointment

## 2015-01-12 NOTE — Progress Notes (Deleted)
   Subjective:    Patient ID: Caleb Heath, male    DOB: 08/15/1975, 39 y.o.   MRN: [id number]  Abdominal Pain      Review of Systems  Gastrointestinal: Positive for abdominal pain.       Objective:   Physical Exam        Assessment & Plan:

## 2015-01-12 NOTE — Progress Notes (Signed)
Patient ID: Caleb Heath, male   DOB: 09-Jul-1975, 39 y.o.   MRN: [id number]  CC: abdominal pain   HPI: Caleb Heath is a 39 y.o. male here today for evaluation of abdominal pain.  Patient has past medical history of HTN and migraines. Last week patient started having flank pain but thought that it was related to his back injury several years ago. Later that week he noticed lower abdominal pain below the navel. He states that during that time he had numbness in his testicles. He denies new sexual partners, penile discharge, erectile dysfunction. He does not have a history of kidney stones. He denies nausea, vomiting, fevers, or chills.  Patient is present with girlfriend who states that patient has been passing blood in his stool for the past 2 years. He states that he notices large amounts of blood in the commode. He states that he often has blood in his underwear or in the bed when he wakes up in the mornings. He does admit to hemorrhoids and uses Preparation H. He states that his father passed away in his early 53's from colon cancer. Patient has never had a colonoscopy.   Patient has No headache, No chest pain, No abdominal pain - No Nausea, No new weakness tingling or numbness, No Cough - SOB.  No Known Allergies Past Medical History  Diagnosis Date  . Fx wrist   . Hypertension   . Anxiety   . Migraine    Current Outpatient Prescriptions on File Prior to Visit  Medication Sig Dispense Refill  . amLODipine (NORVASC) 10 MG tablet Take 1 tablet (10 mg total) by mouth daily. 30 tablet 6  . cetirizine (ZYRTEC) 10 MG tablet Take 1 tablet (10 mg total) by mouth daily. (Patient not taking: Reported on 11/01/2014) 30 tablet 11  . fluticasone (FLONASE) 50 MCG/ACT nasal spray Place 2 sprays into both nostrils daily. (Patient not taking: Reported on 11/01/2014) 16 g 6  . traMADol (ULTRAM) 50 MG tablet Take 1 tablet (50 mg total) by mouth every 12 (twelve) hours as needed for severe pain. 60 tablet 0    No current facility-administered medications on file prior to visit.   Family History  Problem Relation Age of Onset  . Hypertension Mother   . Cancer Father 10    colon   Social History   Social History  . Marital Status: Single    Spouse Name: N/A  . Number of Children: N/A  . Years of Education: N/A   Occupational History  . Not on file.   Social History Main Topics  . Smoking status: Current Every Day Smoker -- 0.50 packs/day    Types: Cigarettes  . Smokeless tobacco: Not on file     Comment: .5 ppd  . Alcohol Use: 7.2 oz/week    0 Standard drinks or equivalent, 12 Cans of beer per week     Comment: weekend  . Drug Use: No  . Sexual Activity: Not on file   Other Topics Concern  . Not on file   Social History Narrative    Review of Systems: Other than what is stated in HPI, all other systems are negative.   Objective:   Filed Vitals:   01/12/15 1638  BP: 155/82  Pulse: 90  Temp: 98 F (36.7 C)  Resp: 16    Physical Exam  Constitutional: He is oriented to person, place, and time.  Cardiovascular: Normal rate, regular rhythm and normal heart sounds.   Pulmonary/Chest: Effort normal and  breath sounds normal.  Abdominal: Soft. Bowel sounds are normal. He exhibits no mass. There is no tenderness. A hernia is present.  Genitourinary: Testes normal and penis normal. Right testis shows no swelling and no tenderness. Left testis shows no swelling and no tenderness. Circumcised. No penile tenderness. No discharge found.  Refused rectal exam  Lymphadenopathy: No inguinal adenopathy noted on the right or left side.  Neurological: He is alert and oriented to person, place, and time.  Skin: Skin is warm and dry.  Psychiatric: He has a normal mood and affect.     Lab Results  Component Value Date   WBC 8.3 06/10/2014   HGB 16.3 09/12/2014   HCT 48.0 09/12/2014   MCV 89.6 06/10/2014   PLT 218 06/10/2014   Lab Results  Component Value Date   CREATININE  1.10 09/12/2014   BUN 7 09/12/2014   NA 142 09/12/2014   K 4.0 09/12/2014   CL 104 09/12/2014   CO2 22 02/19/2014    No results found for: HGBA1C Lipid Panel     Component Value Date/Time   CHOL 180 10/26/2013 0929   TRIG 42 10/26/2013 0929   HDL 97 10/26/2013 0929   CHOLHDL 1.9 10/26/2013 0929   VLDL 8 10/26/2013 0929   LDLCALC 75 10/26/2013 0929       Assessment and plan:   [name] was seen today for abdominal pain.  Diagnoses and all orders for this visit:  Abdominal pain, unspecified abdominal location -     Urinalysis Dipstick -     DG Abd 1 View; Future Will send for KUB to r/o kidney stones.   Hematuria -     PSA Has never had hematuria in pass. Will r/o Kidney stones. Will also check a PSA. He wants to wait to have prostate exam by Urologist if needed. If PSA and KUB is normal I will refer to Urology  Occult blood in stools -     Ambulatory referral to Gastroenterology -     CBC Wants to wait to have rectal exam by GI. Will refer out, will likely need a colonoscopy.   Bilateral headaches -     traMADol (ULTRAM) 50 MG tablet; Take 1 tablet (50 mg total) by mouth every 12 (twelve) hours as needed for severe pain. Stable, meds refilled   Healthcare maintenance -     Flu Vaccine QUAD 36+ mos PF IM (Fluarix & Fluzone Quad PF)   Return in about 3 months (around 04/14/2015) for Hypertension.   Ambrose FinlandValerie A Keck, NP-C [facility] [phone] 01/12/2015, 5:13 PM

## 2015-01-12 NOTE — Progress Notes (Signed)
Patient complains of having some abd pain under his umbilical area Pain started about a week ago and patient is not sure if it is his blood Pressure medication

## 2015-01-13 ENCOUNTER — Telehealth: Payer: Self-pay

## 2015-01-13 ENCOUNTER — Telehealth (HOSPITAL_BASED_OUTPATIENT_CLINIC_OR_DEPARTMENT_OTHER): Payer: No Typology Code available for payment source

## 2015-01-13 ENCOUNTER — Other Ambulatory Visit: Payer: Self-pay

## 2015-01-13 DIAGNOSIS — Z0000 Encounter for general adult medical examination without abnormal findings: Secondary | ICD-10-CM

## 2015-01-13 LAB — PSA: PSA: 0.87 ng/mL (ref <=4.00)

## 2015-01-13 NOTE — Telephone Encounter (Signed)
Pt. Returned call. Please f/u with pr.

## 2015-01-13 NOTE — Telephone Encounter (Signed)
Attempted to contact patient Patient not available Left message on voice mail to return our call 

## 2015-01-13 NOTE — Telephone Encounter (Signed)
Returned patient phone call Patient is aware to go to radiology dept to get the x ray his  Provider has ordered (KUB)

## 2015-01-18 NOTE — Telephone Encounter (Signed)
Nurse called patient, patient verified date of birth. Patient aware of labs within normal limits. Patient voices understanding and has no further questions at this time.  

## 2015-01-18 NOTE — Telephone Encounter (Signed)
-----   Message from Ambrose FinlandValerie A Keck, NP sent at 01/18/2015  9:26 AM EDT ----- Labs are within normal limits

## 2015-02-27 ENCOUNTER — Encounter (HOSPITAL_COMMUNITY): Payer: Self-pay | Admitting: *Deleted

## 2015-02-27 ENCOUNTER — Emergency Department (HOSPITAL_COMMUNITY)
Admission: EM | Admit: 2015-02-27 | Discharge: 2015-02-27 | Disposition: A | Discharge status: Home / Self Care | Payer: No Typology Code available for payment source | Attending: Emergency Medicine | Admitting: Emergency Medicine

## 2015-02-27 ENCOUNTER — Emergency Department (HOSPITAL_COMMUNITY): Payer: No Typology Code available for payment source

## 2015-02-27 DIAGNOSIS — R1030 Lower abdominal pain, unspecified: Secondary | ICD-10-CM | POA: Diagnosis present

## 2015-02-27 DIAGNOSIS — F17210 Nicotine dependence, cigarettes, uncomplicated: Secondary | ICD-10-CM | POA: Insufficient documentation

## 2015-02-27 DIAGNOSIS — I10 Essential (primary) hypertension: Secondary | ICD-10-CM | POA: Insufficient documentation

## 2015-02-27 DIAGNOSIS — K921 Melena: Secondary | ICD-10-CM

## 2015-02-27 DIAGNOSIS — R1033 Periumbilical pain: Secondary | ICD-10-CM | POA: Insufficient documentation

## 2015-02-27 DIAGNOSIS — R109 Unspecified abdominal pain: Secondary | ICD-10-CM

## 2015-02-27 DIAGNOSIS — G43909 Migraine, unspecified, not intractable, without status migrainosus: Secondary | ICD-10-CM | POA: Diagnosis not present

## 2015-02-27 DIAGNOSIS — F419 Anxiety disorder, unspecified: Secondary | ICD-10-CM | POA: Insufficient documentation

## 2015-02-27 DIAGNOSIS — R195 Other fecal abnormalities: Secondary | ICD-10-CM | POA: Insufficient documentation

## 2015-02-27 LAB — URINALYSIS, ROUTINE W REFLEX MICROSCOPIC
GLUCOSE, UA: NEGATIVE mg/dL
Ketones, ur: 40 mg/dL — AB
LEUKOCYTES UA: NEGATIVE
Nitrite: NEGATIVE
PH: 6 (ref 5.0–8.0)
PROTEIN: 30 mg/dL — AB
SPECIFIC GRAVITY, URINE: 1.025 (ref 1.005–1.030)

## 2015-02-27 LAB — CBC
HEMATOCRIT: 47 % (ref 39.0–52.0)
Hemoglobin: 16.2 g/dL (ref 13.0–17.0)
MCH: 31.3 pg (ref 26.0–34.0)
MCHC: 34.5 g/dL (ref 30.0–36.0)
MCV: 90.7 fL (ref 78.0–100.0)
PLATELETS: 248 10*3/uL (ref 150–400)
RBC: 5.18 MIL/uL (ref 4.22–5.81)
RDW: 13.2 % (ref 11.5–15.5)
WBC: 7.8 10*3/uL (ref 4.0–10.5)

## 2015-02-27 LAB — COMPREHENSIVE METABOLIC PANEL
ALT: 19 U/L (ref 17–63)
AST: 38 U/L (ref 15–41)
Albumin: 4.5 g/dL (ref 3.5–5.0)
Alkaline Phosphatase: 69 U/L (ref 38–126)
Anion gap: 11 (ref 5–15)
BUN: 6 mg/dL (ref 6–20)
CHLORIDE: 103 mmol/L (ref 101–111)
CO2: 25 mmol/L (ref 22–32)
CREATININE: 0.8 mg/dL (ref 0.61–1.24)
Calcium: 9.9 mg/dL (ref 8.9–10.3)
Glucose, Bld: 93 mg/dL (ref 65–99)
POTASSIUM: 3.6 mmol/L (ref 3.5–5.1)
SODIUM: 139 mmol/L (ref 135–145)
Total Bilirubin: 0.5 mg/dL (ref 0.3–1.2)
Total Protein: 7.5 g/dL (ref 6.5–8.1)

## 2015-02-27 LAB — URINE MICROSCOPIC-ADD ON: Squamous Epithelial / LPF: NONE SEEN

## 2015-02-27 LAB — POC OCCULT BLOOD, ED: Fecal Occult Bld: NEGATIVE

## 2015-02-27 LAB — LIPASE, BLOOD: LIPASE: 24 U/L (ref 11–51)

## 2015-02-27 MED ORDER — IOHEXOL 300 MG/ML  SOLN
100.0000 mL | Freq: Once | INTRAMUSCULAR | Status: AC | PRN
  Administered 2015-02-27: 100 mL via INTRAVENOUS

## 2015-02-27 MED ORDER — SODIUM CHLORIDE 0.9 % IV BOLUS (SEPSIS)
2000.0000 mL | Freq: Once | INTRAVENOUS | Status: AC
  Administered 2015-02-27: 2000 mL via INTRAVENOUS

## 2015-02-27 NOTE — ED Provider Notes (Signed)
CSN: [id number]     Arrival date & time 02/27/15  1143 History   First MD Initiated Contact with Patient 02/27/15 1719     Chief Complaint  Patient presents with  . Abdominal Pain  . Blood In Stools    HPI   Caleb Heath is a 39 y.o. male with a PMH of hypertension, anxiety, migraines who presents to the ED with lower abdominal pain and hematochezia. He states he had a loose bowel movement this morning with blood in it, which is when his abdominal pain started. He reports his pain comes and goes. He denies exacerbating factors. He has not tried anything for symptom relief. He denies fever, chills, chest pain, shortness of breath, nausea, vomiting, dysuria, urgency, frequency. He states he has a history of hemorrhoids, however has never had bleeding like this before.   Past Medical History  Diagnosis Date  . Fx wrist   . Hypertension   . Anxiety   . Migraine    History reviewed. No pertinent past surgical history. Family History  Problem Relation Age of Onset  . Hypertension Mother   . Cancer Father 3650    colon   Social History  Substance Use Topics  . Smoking status: Current Every Day Smoker -- 0.50 packs/day    Types: Cigarettes  . Smokeless tobacco: None     Comment: .5 ppd  . Alcohol Use: 7.2 oz/week    0 Standard drinks or equivalent, 12 Cans of beer per week     Comment: weekend     Review of Systems  Constitutional: Negative for fever and chills.  Respiratory: Negative for shortness of breath.   Cardiovascular: Negative for chest pain.  Gastrointestinal: Positive for abdominal pain, diarrhea and blood in stool. Negative for nausea, vomiting and constipation.  Genitourinary: Negative for dysuria, urgency and frequency.  Neurological: Negative for dizziness, weakness, light-headedness, numbness and headaches.  All other systems reviewed and are negative.     Allergies  Review of patient's allergies indicates no known allergies.  Home Medications   Prior  to Admission medications   Medication Sig Start Date End Date Taking? Authorizing Provider  amLODipine (NORVASC) 10 MG tablet Take 1 tablet (10 mg total) by mouth daily. 07/07/14  Yes Ambrose FinlandValerie A Keck, NP    BP 161/107 mmHg  Pulse 93  Temp(Src) 99 F (37.2 C) (Oral)  Resp 16  Ht 6\' 2"  (1.88 m)  Wt 74.98 kg  BMI 21.21 kg/m2  SpO2 99% Physical Exam  Constitutional: He is oriented to person, place, and time. He appears well-developed and well-nourished. No distress.  HENT:  Head: Normocephalic and atraumatic.  Right Ear: External ear normal.  Left Ear: External ear normal.  Nose: Nose normal.  Mouth/Throat: Uvula is midline, oropharynx is clear and moist and mucous membranes are normal.  Eyes: Conjunctivae, EOM and lids are normal. Pupils are equal, round, and reactive to light. Right eye exhibits no discharge. Left eye exhibits no discharge. No scleral icterus.  Neck: Normal range of motion. Neck supple.  Cardiovascular: Normal rate, regular rhythm, normal heart sounds, intact distal pulses and normal pulses.   Pulmonary/Chest: Effort normal and breath sounds normal. No respiratory distress. He has no wheezes. He has no rales.  Abdominal: Soft. Normal appearance and bowel sounds are normal. He exhibits no distension and no mass. There is tenderness. There is no rigidity, no rebound and no guarding.  Mild tenderness to palpation of periumbilical and suprapubic region. No rebound, guarding, or masses.  Genitourinary: Rectal exam shows internal hemorrhoid and tenderness. Rectal exam shows no external hemorrhoid, no fissure and no mass.  Internal hemorrhoid with TTP.  Musculoskeletal: Normal range of motion. He exhibits no edema or tenderness.  Neurological: He is alert and oriented to person, place, and time. He has normal strength. No sensory deficit.  Skin: Skin is warm, dry and intact. No rash noted. He is not diaphoretic. No erythema. No pallor.  Psychiatric: He has a normal mood and  affect. His speech is normal and behavior is normal.  Nursing note and vitals reviewed.   ED Course  Procedures (including critical care time)  Labs Review Labs Reviewed  URINALYSIS, ROUTINE W REFLEX MICROSCOPIC (NOT AT [facility]) - Abnormal; Notable for the following:    Color, Urine AMBER (*)    Hgb urine dipstick TRACE (*)    Bilirubin Urine SMALL (*)    Ketones, ur 40 (*)    Protein, ur 30 (*)    All other components within normal limits  URINE MICROSCOPIC-ADD ON - Abnormal; Notable for the following:    Bacteria, UA RARE (*)    Casts HYALINE CASTS (*)    All other components within normal limits  LIPASE, BLOOD  COMPREHENSIVE METABOLIC PANEL  CBC  POC OCCULT BLOOD, ED  POC OCCULT BLOOD, ED    Imaging Review No results found.   I have personally reviewed and evaluated these images and lab results as part of my medical decision-making.   EKG Interpretation None      MDM   Final diagnoses:  Abdominal pain, unspecified abdominal location  Blood in stool    39 year old male presents with abdominal pain, which he states started this morning, and one episode of hematochezia. Denies fever, chills, chest pain, shortness of breath, nausea, vomiting, dysuria, urgency, frequency. Has a history of internal hemorrhoids, however states he has never experienced similar symptoms.   Patient is afebrile. Mildly hypertensive. Heart regular rate and rhythm. Lungs clear to auscultation bilaterally. TTP of periumbilical and suprapubic region. No rebound, guarding, or masses. Internal hemorrhoid with associated tenderness palpation on DRE. No gross blood.  CBC negative for leukocytosis or anemia. CMP unremarkable. Lipase within normal limits. Hemoccult negative. UA negative for infection.  Patient requesting to eat and drink, declines pain medication. Will obtain CT abdomen pelvis for further evaluation of patient's symptoms.  Patient signed ou to Earley Favor, NP at shift change. CT  abdomen pelvis pending. If negative, patient stable for discharge with PCP follow-up.  BP 161/107 mmHg  Pulse 93  Temp(Src) 99 F (37.2 C) (Oral)  Resp 16  Ht  (1.88 m)  Wt 74.98 kg  BMI 21.21 kg/m2  SpO2 99%       Mady Gemma, PA-C 02/27/15 2039  Loren Racer, MD 02/27/15 2226

## 2015-02-27 NOTE — Discharge Instructions (Signed)
1. Medications: usual home medications 2. Treatment: rest, drink plenty of fluids 3. Follow Up: please followup with your primary doctor this week for discussion of your diagnoses and further evaluation after today's visit; please return to the ER for high fever, severe pain, persistent bleeding, new or worsening symptoms   Abdominal Pain, Adult Many things can cause abdominal pain. Usually, abdominal pain is not caused by a disease and will improve without treatment. It can often be observed and treated at home. Your health care provider will do a physical exam and possibly order blood tests and X-rays to help determine the seriousness of your pain. However, in many cases, more time must pass before a clear cause of the pain can be found. Before that point, your health care provider may not know if you need more testing or further treatment. HOME CARE INSTRUCTIONS Monitor your abdominal pain for any changes. The following actions may help to alleviate any discomfort you are experiencing:  Only take over-the-counter or prescription medicines as directed by your health care provider.  Do not take laxatives unless directed to do so by your health care provider.  Try a clear liquid diet (broth, tea, or water) as directed by your health care provider. Slowly move to a bland diet as tolerated. SEEK MEDICAL CARE IF:  You have unexplained abdominal pain.  You have abdominal pain associated with nausea or diarrhea.  You have pain when you urinate or have a bowel movement.  You experience abdominal pain that wakes you in the night.  You have abdominal pain that is worsened or improved by eating food.  You have abdominal pain that is worsened with eating fatty foods.  You have a fever. SEEK IMMEDIATE MEDICAL CARE IF:  Your pain does not go away within 2 hours.  You keep throwing up (vomiting).  Your pain is felt only in portions of the abdomen, such as the right side or the left lower  portion of the abdomen.  You pass bloody or black tarry stools. MAKE SURE YOU:  Understand these instructions.  Will watch your condition.  Will get help right away if you are not doing well or get worse.   This information is not intended to replace advice given to you by your health care provider. Make sure you discuss any questions you have with your health care provider.   Document Released: 12/19/2004 Document Revised: 11/30/2014 Document Reviewed: 11/18/2012 Elsevier Interactive Patient Education 2016 Elsevier Inc.  Bloody Diarrhea Bloody diarrhea can be caused by many different conditions. Most of the time bloody diarrhea is the result of food poisoning or minor infections. Bloody diarrhea usually improves over 2 to 3 days of rest and fluid replacement. Other conditions that can cause bloody diarrhea include:  Internal bleeding.  Infection.  Diseases of the bowel and colon. Internal bleeding from an ulcer or bowel disease can be severe and requires hospital care or even surgery. DIAGNOSIS  To find out what is wrong your caregiver may check your:  Stool.  Blood.  Results from a test that looks inside the body (endoscopy). TREATMENT   Get plenty of rest.  Drink enough water and fluids to keep your urine clear or pale yellow.  Do not smoke.  Solid foods and dairy products should be avoided until your illness improves.  As you improve, slowly return to a regular diet with easily-digested foods first. Examples are:  Bananas.  Rice.  Toast.  Crackers. You should only need these for about 2  days before adding more normal foods to your diet.  Avoid spicy or fatty foods as well as caffeine and alcohol for several days.  Medicine to control cramping and diarrhea can relieve symptoms but may prolong some cases of bloody diarrhea. Antibiotics can speed recovery from diarrhea due to some bacterial infections. Call your caregiver if diarrhea does not get better in 3  days. SEEK MEDICAL CARE IF:   You do not improve after 3 days.  Your diarrhea improves but your stool appears black. SEEK IMMEDIATE MEDICAL CARE IF:   You become extremely weak or faint.  You become very sweaty.  You have increased pain or bleeding.  You develop repeated vomiting.  You vomit and you see blood or the vomit looks black in color.  You have a fever.   This information is not intended to replace advice given to you by your health care provider. Make sure you discuss any questions you have with your health care provider.   Document Released: 03/11/2005 Document Revised: 04/01/2014 Document Reviewed: 02/10/2009 Elsevier Interactive Patient Education [employer] Inc.  As discusses your CT Scan shows a normal appendix and bowel walls but did identify a lesion in your Right kidney   They are recommending an MRI as a follow up  Please call your PCP to set a follow up appointment

## 2015-02-27 NOTE — ED Provider Notes (Signed)
CT Scan reviewed  MRI recommended  For follow up this has been scheduled at discharge and patient informed to make appointment with PCP for follow up   Earley FavorGail Kyrah Schiro, NP 02/27/15 [id number]  Loren Raceravid Yelverton, MD 02/28/15 0001

## 2015-02-27 NOTE — ED Notes (Signed)
Pt reports lower abd pain for extended amount of time, has been to pcp and suppose to follow up with GI dr. This am had moderate amount of blood in stools. No acute distress noted at triage.

## 2015-02-27 NOTE — ED Notes (Signed)
Pt st's he has hx of hemorrhoids but has never had bleeding like this

## 2015-02-27 NOTE — Progress Notes (Signed)
ED CM  Spoke with patient regarding follow up appt with PCP office for 12/12 at 3:30 with V. Luna GlasgowKeck. Patient verbalized understanding, Place information on patient's AVS discharge papers. Updated Kim RN on Pod E. No further ED CM needs identified.

## 2015-03-06 ENCOUNTER — Inpatient Hospital Stay: Payer: No Typology Code available for payment source | Admitting: Internal Medicine

## 2015-03-15 ENCOUNTER — Inpatient Hospital Stay: Payer: No Typology Code available for payment source | Admitting: Internal Medicine

## 2015-05-10 ENCOUNTER — Ambulatory Visit: Payer: BLUE CROSS/BLUE SHIELD | Attending: Internal Medicine | Admitting: Internal Medicine

## 2015-05-10 ENCOUNTER — Encounter: Payer: Self-pay | Admitting: Internal Medicine

## 2015-05-10 VITALS — BP 151/101 | HR 97 | Temp 98.0°F | Resp 16 | Ht 74.0 in | Wt 178.0 lb

## 2015-05-10 DIAGNOSIS — G43009 Migraine without aura, not intractable, without status migrainosus: Secondary | ICD-10-CM | POA: Diagnosis not present

## 2015-05-10 DIAGNOSIS — F17200 Nicotine dependence, unspecified, uncomplicated: Secondary | ICD-10-CM

## 2015-05-10 DIAGNOSIS — I10 Essential (primary) hypertension: Secondary | ICD-10-CM | POA: Insufficient documentation

## 2015-05-10 DIAGNOSIS — Z720 Tobacco use: Secondary | ICD-10-CM | POA: Diagnosis not present

## 2015-05-10 DIAGNOSIS — Z79899 Other long term (current) drug therapy: Secondary | ICD-10-CM | POA: Diagnosis not present

## 2015-05-10 MED ORDER — AMITRIPTYLINE HCL 25 MG PO TABS: 25.0000 mg | ORAL_TABLET | Freq: Every evening | ORAL | Status: DC | PRN

## 2015-05-10 MED ORDER — NICOTINE 21 MG/24HR TD PT24: 21.0000 mg | MEDICATED_PATCH | Freq: Every day | TRANSDERMAL | Status: DC

## 2015-05-10 MED ORDER — AMLODIPINE BESYLATE 10 MG PO TABS: 10.0000 mg | ORAL_TABLET | Freq: Every day | ORAL | Status: DC

## 2015-05-10 MED FILL — AMLODIPINE BESYLATE 10 MG T: 10 | 30 days supply | Qty: 30 | Fill #0

## 2015-05-10 NOTE — Progress Notes (Signed)
Patient states he is here for a follow up on his blood pressure Patient presents with elevated blood pressure but states he has been out of his  meds for a couple of days

## 2015-05-10 NOTE — Patient Instructions (Addendum)
Keep a log of headache days so we can decide if we need to increase medication dose or send you to a headache specialist  Steps to Quit Smoking  Smoking tobacco can be harmful to your health and can affect almost every organ in your body. Smoking puts you, and those around you, at risk for developing many serious chronic diseases. Quitting smoking is difficult, but it is one of the best things that you can do for your health. It is never too late to quit. WHAT ARE THE BENEFITS OF QUITTING SMOKING? When you quit smoking, you lower your risk of developing serious diseases and conditions, such as:  Lung cancer or lung disease, such as COPD.  Heart disease.  Stroke.  Heart attack.  Infertility.  Osteoporosis and bone fractures. Additionally, symptoms such as coughing, wheezing, and shortness of breath may get better when you quit. You may also find that you get sick less often because your body is stronger at fighting off colds and infections. If you are pregnant, quitting smoking can help to reduce your chances of having a baby of low birth weight. HOW DO I GET READY TO QUIT? When you decide to quit smoking, create a plan to make sure that you are successful. Before you quit:  Pick a date to quit. Set a date within the next two weeks to give you time to prepare.  Write down the reasons why you are quitting. Keep this list in places where you will see it often, such as on your bathroom mirror or in your car or wallet.  Identify the people, places, things, and activities that make you want to smoke (triggers) and avoid them. Make sure to take these actions:  Throw away all cigarettes at home, at work, and in your car.  Throw away smoking accessories, such as Set designer.  Clean your car and make sure to empty the ashtray.  Clean your home, including curtains and carpets.  Tell your family, friends, and coworkers that you are quitting. Support from your loved ones can make  quitting easier.  Talk with your health care provider about your options for quitting smoking.  Find out what treatment options are covered by your health insurance. WHAT STRATEGIES CAN I USE TO QUIT SMOKING?  Talk with your healthcare provider about different strategies to quit smoking. Some strategies include:  Quitting smoking altogether instead of gradually lessening how much you smoke over a period of time. Research shows that quitting "cold [place]" is more successful than gradually quitting.  Attending in-person counseling to help you build problem-solving skills. You are more likely to have success in quitting if you attend several counseling sessions. Even short sessions of 10 minutes can be effective.  Finding resources and support systems that can help you to quit smoking and remain smoke-free after you quit. These resources are most helpful when you use them often. They can include:  Online chats with a Veterinary surgeon.  Telephone quitlines.  Printed Materials engineer.  Support groups or group counseling.  Text messaging programs.  Mobile phone applications.  Taking medicines to help you quit smoking. (If you are pregnant or breastfeeding, talk with your health care provider first.) Some medicines contain nicotine and some do not. Both types of medicines help with cravings, but the medicines that include nicotine help to relieve withdrawal symptoms. Your health care provider may recommend:  Nicotine patches, gum, or lozenges.  Nicotine inhalers or sprays.  Non-nicotine medicine that is taken by mouth. Talk  with your health care provider about combining strategies, such as taking medicines while you are also receiving in-person counseling. Using these two strategies together makes you more likely to succeed in quitting than if you used either strategy on its own. If you are pregnant or breastfeeding, talk with your health care provider about finding counseling or other support  strategies to quit smoking. Do not take medicine to help you quit smoking unless told to do so by your health care provider. WHAT THINGS CAN I DO TO MAKE IT EASIER TO QUIT? Quitting smoking might feel overwhelming at first, but there is a lot that you can do to make it easier. Take these important actions:  Reach out to your family and friends and ask that they support and encourage you during this time. Call telephone quitlines, reach out to support groups, or work with a counselor for support.  Ask people who smoke to avoid smoking around you.  Avoid places that trigger you to smoke, such as bars, parties, or smoke-break areas at work.  Spend time around people who do not smoke.  Lessen stress in your life, because stress can be a smoking trigger for some people. To lessen stress, try:  Exercising regularly.  Deep-breathing exercises.  Yoga.  Meditating.  Performing a body scan. This involves closing your eyes, scanning your body from head to toe, and noticing which parts of your body are particularly tense. Purposefully relax the muscles in those areas.  Download or purchase mobile phone or tablet apps (applications) that can help you stick to your quit plan by providing reminders, tips, and encouragement. There are many free apps, such as QuitGuide from the [employer] developer for Disease Control and Prevention). You can find other support for quitting smoking (smoking cessation) through smokefree.gov and other websites. HOW WILL I FEEL WHEN I QUIT SMOKING? Within the first 24 hours of quitting smoking, you may start to feel some withdrawal symptoms. These symptoms are usually most noticeable 2-3 days after quitting, but they usually do not last beyond 2-3 weeks. Changes or symptoms that you might experience include:  Mood swings.  Restlessness, anxiety, or irritation.  Difficulty concentrating.  Dizziness.  Strong cravings for sugary foods in addition to nicotine.  Mild weight  gain.  Constipation.  Nausea.  Coughing or a sore throat.  Changes in how your medicines work in your body.  A depressed mood.  Difficulty sleeping (insomnia). After the first 2-3 weeks of quitting, you may start to notice more positive results, such as:  Improved sense of smell and taste.  Decreased coughing and sore throat.  Slower heart rate.  Lower blood pressure.  Clearer skin.  The ability to breathe more easily.  Fewer sick days. Quitting smoking is very challenging for most people. Do not get discouraged if you are not successful the first time. Some people need to make many attempts to quit before they achieve long-term success. Do your best to stick to your quit plan, and talk with your health care provider if you have any questions or concerns.   This information is not intended to replace advice given to you by your health care provider. Make sure you discuss any questions you have with your health care provider.   Document Released: 03/05/2001 Document Revised: 07/26/2014 Document Reviewed: 07/26/2014 Elsevier Interactive Patient Education [employer]

## 2015-05-10 NOTE — Progress Notes (Signed)
Patient ID: Caleb Heath, male   DOB: 1975-04-10, 40 y.o.   MRN: [id number] Subjective:  Caleb Heath is a 40 y.o. male with hypertension and tobacco use. Patient states that he has been out of his blood pressure medications for 1 week.  Patient complains of frequent headaches for years. He states that most days he wakes up with headaches. Headaches are usually unilateral and are throbbing. Pain is mostly located in the frontal region behind his eyes. He has light sensitivity and has tried NSAID's and Tramadol for pain. He has never been on any medications for migraines in the past.  Current Outpatient Prescriptions  Medication Sig Dispense Refill  . amLODipine (NORVASC) 10 MG tablet Take 1 tablet (10 mg total) by mouth daily. 30 tablet 6   No current facility-administered medications for this visit.    Hypertension ROS: taking medications as instructed, no medication side effects noted, no TIA's, no chest pain on exertion, no dyspnea on exertion, no swelling of ankles and no palpitations. Some headaches notes. All other systems negative.   Objective:  BP 151/101 mmHg  Pulse 97  Temp(Src) 98 F (36.7 C)  Resp 16  Ht  (1.88 m)  Wt 178 lb (80.74 kg)  BMI 22.84 kg/m2  SpO2 100%  Appearance alert, well appearing, and in no distress, oriented to person, place, and time and overweight. General exam BP noted to be well controlled today in office, S1, S2 normal, no gallop, no murmur, chest clear, no JVD, no HSM, no edema. Cranial nerves intact Lab review: labs are reviewed, up to date and normal.   Assessment:   Caleb Heath was seen today for follow-up.  Diagnoses and all orders for this visit:  Essential hypertension -     Refilled amLODipine (NORVASC) 10 MG tablet; Take 1 tablet (10 mg total) by mouth daily. Pt's BP is elevated today due to not taking medication in one week. I have advised him to pick up refills one week before running out. DASH diet and smoking cessation advised.    Migraine without aura and without status migrainosus, not intractable -     Begin amitriptyline (ELAVIL) 25 MG tablet; Take 1 tablet (25 mg total) by mouth at bedtime as needed. For headache prevention Advised patient to keep a headache diary.   Tobacco use disorder -     Begin nicotine (EQ NICOTINE) 21 mg/24hr patch; Place 1 patch (21 mg total) onto the skin daily. Discussed smoking cessation with patient and tips for success for 3 minutes. Patient agrees to try Nicotine patch  Return in about 3 months (around 08/07/2015) for Hypertension/migraines.   Ambrose Finland, NP 05/10/2015 5:31 PM

## 2015-06-08 ENCOUNTER — Emergency Department (HOSPITAL_COMMUNITY)
Admission: EM | Admit: 2015-06-08 | Discharge: 2015-06-08 | Disposition: A | Discharge status: Home / Self Care | Payer: BLUE CROSS/BLUE SHIELD | Attending: Emergency Medicine | Admitting: Emergency Medicine

## 2015-06-08 ENCOUNTER — Encounter (HOSPITAL_COMMUNITY): Payer: Self-pay | Admitting: Nurse Practitioner

## 2015-06-08 ENCOUNTER — Emergency Department (HOSPITAL_COMMUNITY): Payer: BLUE CROSS/BLUE SHIELD

## 2015-06-08 DIAGNOSIS — S3992XA Unspecified injury of lower back, initial encounter: Secondary | ICD-10-CM | POA: Insufficient documentation

## 2015-06-08 DIAGNOSIS — S59901A Unspecified injury of right elbow, initial encounter: Secondary | ICD-10-CM | POA: Insufficient documentation

## 2015-06-08 DIAGNOSIS — Z79899 Other long term (current) drug therapy: Secondary | ICD-10-CM | POA: Diagnosis not present

## 2015-06-08 DIAGNOSIS — Z8781 Personal history of (healed) traumatic fracture: Secondary | ICD-10-CM | POA: Diagnosis not present

## 2015-06-08 DIAGNOSIS — F419 Anxiety disorder, unspecified: Secondary | ICD-10-CM | POA: Diagnosis not present

## 2015-06-08 DIAGNOSIS — G43909 Migraine, unspecified, not intractable, without status migrainosus: Secondary | ICD-10-CM | POA: Diagnosis not present

## 2015-06-08 DIAGNOSIS — Y9389 Activity, other specified: Secondary | ICD-10-CM | POA: Diagnosis not present

## 2015-06-08 DIAGNOSIS — F17210 Nicotine dependence, cigarettes, uncomplicated: Secondary | ICD-10-CM | POA: Diagnosis not present

## 2015-06-08 DIAGNOSIS — S4991XA Unspecified injury of right shoulder and upper arm, initial encounter: Secondary | ICD-10-CM | POA: Diagnosis not present

## 2015-06-08 DIAGNOSIS — S199XXA Unspecified injury of neck, initial encounter: Secondary | ICD-10-CM | POA: Diagnosis not present

## 2015-06-08 DIAGNOSIS — I10 Essential (primary) hypertension: Secondary | ICD-10-CM | POA: Insufficient documentation

## 2015-06-08 DIAGNOSIS — Y998 Other external cause status: Secondary | ICD-10-CM | POA: Insufficient documentation

## 2015-06-08 DIAGNOSIS — Y92410 Unspecified street and highway as the place of occurrence of the external cause: Secondary | ICD-10-CM | POA: Insufficient documentation

## 2015-06-08 MED ORDER — IBUPROFEN 600 MG PO TABS: 600.0000 mg | ORAL_TABLET | Freq: Four times a day (QID) | ORAL | Status: DC | PRN

## 2015-06-08 MED ORDER — OXYCODONE-ACETAMINOPHEN 5-325 MG PO TABS: 1.0000 | ORAL_TABLET | Freq: Four times a day (QID) | ORAL | Status: DC | PRN

## 2015-06-08 NOTE — ED Notes (Signed)
Patient able to ambulate independently  
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
## 2015-06-08 NOTE — Discharge Instructions (Signed)
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
## 2015-06-08 NOTE — ED Notes (Signed)
Patient able to ambulate independently to room 

## 2015-06-19 MED FILL — AMLODIPINE BESYLATE 10 MG T: 10 | 30 days supply | Qty: 30 | Fill #1

## 2015-08-16 ENCOUNTER — Encounter: Payer: Self-pay | Admitting: Internal Medicine

## 2015-08-16 ENCOUNTER — Ambulatory Visit: Payer: BLUE CROSS/BLUE SHIELD | Attending: Internal Medicine | Admitting: Internal Medicine

## 2015-08-16 VITALS — BP 163/102 | HR 93 | Temp 98.4°F | Wt 175.6 lb

## 2015-08-16 DIAGNOSIS — M5417 Radiculopathy, lumbosacral region: Secondary | ICD-10-CM | POA: Diagnosis not present

## 2015-08-16 DIAGNOSIS — Z13220 Encounter for screening for lipoid disorders: Secondary | ICD-10-CM

## 2015-08-16 DIAGNOSIS — Z131 Encounter for screening for diabetes mellitus: Secondary | ICD-10-CM | POA: Diagnosis not present

## 2015-08-16 DIAGNOSIS — Z79899 Other long term (current) drug therapy: Secondary | ICD-10-CM | POA: Insufficient documentation

## 2015-08-16 DIAGNOSIS — F17210 Nicotine dependence, cigarettes, uncomplicated: Secondary | ICD-10-CM | POA: Diagnosis not present

## 2015-08-16 DIAGNOSIS — F411 Generalized anxiety disorder: Secondary | ICD-10-CM | POA: Diagnosis not present

## 2015-08-16 DIAGNOSIS — Z114 Encounter for screening for human immunodeficiency virus [HIV]: Secondary | ICD-10-CM

## 2015-08-16 DIAGNOSIS — M545 Low back pain: Secondary | ICD-10-CM | POA: Diagnosis not present

## 2015-08-16 DIAGNOSIS — G8929 Other chronic pain: Secondary | ICD-10-CM

## 2015-08-16 DIAGNOSIS — M5416 Radiculopathy, lumbar region: Secondary | ICD-10-CM

## 2015-08-16 DIAGNOSIS — G43909 Migraine, unspecified, not intractable, without status migrainosus: Secondary | ICD-10-CM | POA: Insufficient documentation

## 2015-08-16 DIAGNOSIS — I10 Essential (primary) hypertension: Secondary | ICD-10-CM | POA: Diagnosis not present

## 2015-08-16 DIAGNOSIS — Z720 Tobacco use: Secondary | ICD-10-CM

## 2015-08-16 LAB — HIV ANTIBODY (ROUTINE TESTING W REFLEX): HIV: NONREACTIVE

## 2015-08-16 LAB — BASIC METABOLIC PANEL WITH GFR
BUN: 4 mg/dL — AB (ref 7–25)
CHLORIDE: 103 mmol/L (ref 98–110)
CO2: 28 mmol/L (ref 20–31)
CREATININE: 0.78 mg/dL (ref 0.60–1.35)
Calcium: 9.8 mg/dL (ref 8.6–10.3)
GFR, Est African American: >89 mL/min (ref >=60)
GFR, Est Non African American: >89 mL/min (ref >=60)
Glucose, Bld: 80 mg/dL (ref 65–99)
POTASSIUM: 4.6 mmol/L (ref 3.5–5.3)
Sodium: 140 mmol/L (ref 135–146)

## 2015-08-16 LAB — LIPID PANEL
CHOLESTEROL: 171 mg/dL (ref 125–200)
HDL: 71 mg/dL (ref >=40)
LDL CALC: 87 mg/dL (ref <130)
TRIGLYCERIDES: 63 mg/dL (ref <150)
Total CHOL/HDL Ratio: 2.4 Ratio (ref <=5.0)
VLDL: 13 mg/dL (ref <30)

## 2015-08-16 LAB — CBC WITH DIFFERENTIAL/PLATELET
BASOS ABS: 0 {cells}/uL (ref 0–200)
BASOS PCT: 0 %
EOS ABS: 59 {cells}/uL (ref 15–500)
Eosinophils Relative: 1 %
HCT: 46.7 % (ref 38.5–50.0)
Hemoglobin: 16 g/dL (ref 13.2–17.1)
LYMPHS ABS: 2065 {cells}/uL (ref 850–3900)
Lymphocytes Relative: 35 %
MCH: 31.3 pg (ref 27.0–33.0)
MCHC: 34.3 g/dL (ref 32.0–36.0)
MCV: 91.2 fL (ref 80.0–100.0)
MONO ABS: 590 {cells}/uL (ref 200–950)
MPV: 10.5 fL (ref 7.5–12.5)
Monocytes Relative: 10 %
NEUTROS ABS: 3186 {cells}/uL (ref 1500–7800)
Neutrophils Relative %: 54 %
Platelets: 274 10*3/uL (ref 140–400)
RBC: 5.12 MIL/uL (ref 4.20–5.80)
RDW: 14.2 % (ref 11.0–15.0)
WBC: 5.9 10*3/uL (ref 3.8–10.8)

## 2015-08-16 LAB — HEMOGLOBIN A1C
HEMOGLOBIN A1C: 5.8 % — AB (ref <5.7)
Mean Plasma Glucose: 120 mg/dL

## 2015-08-16 MED ORDER — AMLODIPINE BESYLATE 10 MG PO TABS: 10.0000 mg | ORAL_TABLET | Freq: Every day | ORAL | Status: DC

## 2015-08-16 MED ORDER — LISINOPRIL 20 MG PO TABS: 20.0000 mg | ORAL_TABLET | Freq: Every day | ORAL | Status: DC

## 2015-08-16 MED ORDER — BUPROPION HCL ER (SR) 150 MG PO TB12: 150.0000 mg | ORAL_TABLET | Freq: Two times a day (BID) | ORAL | Status: DC

## 2015-08-16 MED FILL — AMLODIPINE BESYLATE 10 MG T: 10 | 30 days supply | Qty: 30 | Fill #0

## 2015-08-16 NOTE — Patient Instructions (Addendum)
DASH Eating Plan DASH stands for "Dietary Approaches to Stop Hypertension." The DASH eating plan is a healthy eating plan that has been shown to reduce high blood pressure (hypertension). Additional health benefits may include reducing the risk of type 2 diabetes mellitus, heart disease, and stroke. The DASH eating plan may also help with weight loss. WHAT DO I NEED TO KNOW ABOUT THE DASH EATING PLAN? For the DASH eating plan, you will follow these general guidelines: 1. Choose foods with a percent daily value for sodium of less than 5% (as listed on the food label). 2. Use salt-free seasonings or herbs instead of table salt or sea salt. 3. Check with your health care provider or pharmacist before using salt substitutes. 4. Eat lower-sodium products, often labeled as "lower sodium" or "no salt added." 5. Eat fresh foods. 6. Eat more vegetables, fruits, and low-fat dairy products. 7. Choose whole grains. Look for the word "whole" as the first word in the ingredient list. 8. Choose fish and skinless chicken or [place] more often than red meat. Limit fish, poultry, and meat to 6 oz (170 g) each day. 9. Limit sweets, desserts, sugars, and sugary drinks. 10. Choose heart-healthy fats. 11. Limit cheese to 1 oz (28 g) per day. 12. Eat more home-cooked food and less restaurant, buffet, and fast food. 13. Limit fried foods. [place] foods using methods other than frying. 15. Limit canned vegetables. If you do use them, rinse them well to decrease the sodium. 16. When eating at a restaurant, ask that your food be prepared with less salt, or no salt if possible. WHAT FOODS CAN I EAT? Seek help from a dietitian for individual calorie needs. Grains Whole grain or whole wheat bread. Brown rice. Whole grain or whole wheat pasta. Quinoa, bulgur, and whole grain cereals. Low-sodium cereals. Corn or whole wheat flour tortillas. Whole grain cornbread. Whole grain crackers. Low-sodium crackers. Vegetables Fresh  or frozen vegetables (raw, steamed, roasted, or grilled). Low-sodium or reduced-sodium tomato and vegetable juices. Low-sodium or reduced-sodium tomato sauce and paste. Low-sodium or reduced-sodium canned vegetables.  Fruits All fresh, canned (in natural juice), or frozen fruits. Meat and Other Protein Products Ground beef (85% or leaner), grass-fed beef, or beef trimmed of fat. Skinless chicken or [place]. Ground chicken or [place]. Pork trimmed of fat. All fish and seafood. Eggs. Dried beans, peas, or lentils. Unsalted nuts and seeds. Unsalted canned beans. Dairy Low-fat dairy products, such as skim or 1% milk, 2% or reduced-fat cheeses, low-fat ricotta or cottage cheese, or plain low-fat yogurt. Low-sodium or reduced-sodium cheeses. Fats and Oils Tub margarines without trans fats. Light or reduced-fat mayonnaise and salad dressings (reduced sodium). Avocado. Safflower, olive, or canola oils. Natural peanut or almond butter. Other Unsalted popcorn and pretzels. The items listed above may not be a complete list of recommended foods or beverages. Contact your dietitian for more options. WHAT FOODS ARE NOT RECOMMENDED? Grains White bread. White pasta. White rice. Refined cornbread. Bagels and croissants. Crackers that contain trans fat. Vegetables Creamed or fried vegetables. Vegetables in a cheese sauce. Regular canned vegetables. Regular canned tomato sauce and paste. Regular tomato and vegetable juices. Fruits Dried fruits. Canned fruit in light or heavy syrup. Fruit juice. Meat and Other Protein Products Fatty cuts of meat. Ribs, chicken wings, bacon, sausage, bologna, salami, chitterlings, fatback, hot dogs, bratwurst, and packaged luncheon meats. Salted nuts and seeds. Canned beans with salt. Dairy Whole or 2% milk, cream, half-and-half, and cream cheese. Whole-fat or sweetened yogurt. Full-fat  cheeses or blue cheese. Nondairy creamers and whipped toppings. Processed cheese, cheese spreads,  or cheese curds. Condiments Onion and garlic salt, seasoned salt, table salt, and sea salt. Canned and packaged gravies. Worcestershire sauce. Tartar sauce. Barbecue sauce. Teriyaki sauce. Soy sauce, including reduced sodium. Steak sauce. Fish sauce. Oyster sauce. Cocktail sauce. Horseradish. Ketchup and mustard. Meat flavorings and tenderizers. Bouillon cubes. Hot sauce. Tabasco sauce. Marinades. Taco seasonings. Relishes. Fats and Oils Butter, stick margarine, lard, shortening, ghee, and bacon fat. Coconut, palm kernel, or palm oils. Regular salad dressings. Other Pickles and olives. Salted popcorn and pretzels. The items listed above may not be a complete list of foods and beverages to avoid. Contact your dietitian for more information. WHERE CAN I FIND MORE INFORMATION? National Heart, Lung, and Blood Institute: [employer]   This information is not intended to replace advice given to you by your health care provider. Make sure you discuss any questions you have with your health care provider.   Document Released: 02/28/2011 Document Revised: 04/01/2014 Document Reviewed: 01/13/2013 Elsevier Interactive Patient Education 2016 Elsevier Inc. - Low-Sodium Eating Plan Sodium raises blood pressure and causes water to be held in the body. Getting less sodium from food will help lower your blood pressure, reduce any swelling, and protect your heart, liver, and kidneys. We get sodium by adding salt (sodium chloride) to food. Most of our sodium comes from canned, boxed, and frozen foods. Restaurant foods, fast foods, and pizza are also very high in sodium. Even if you take medicine to lower your blood pressure or to reduce fluid in your body, getting less sodium from your food is important. WHAT IS MY PLAN? Most people should limit their sodium intake to 2,300 mg a day. Your health care provider recommends that you limit your sodium intake to __________ a day.    WHAT DO I NEED TO KNOW ABOUT THIS EATING PLAN? For the low-sodium eating plan, you will follow these general guidelines: 17. Choose foods with a % Daily Value for sodium of less than 5% (as listed on the food label).  18. Use salt-free seasonings or herbs instead of table salt or sea salt.  19. Check with your health care provider or pharmacist before using salt substitutes.  20. Eat fresh foods. 21. Eat more vegetables and fruits. 22. Limit canned vegetables. If you do use them, rinse them well to decrease the sodium.  23. Limit cheese to 1 oz (28 g) per day.  24. Eat lower-sodium products, often labeled as "lower sodium" or "no salt added." 25. Avoid foods that contain monosodium glutamate (MSG). MSG is sometimes added to [place] food and some canned foods. 26. Check food labels (Nutrition Facts labels) on foods to learn how much sodium is in one serving. 27. Eat more home-cooked food and less restaurant, buffet, and fast food. 28. When eating at a restaurant, ask that your food be prepared with less salt, or no salt if possible.  HOW DO I READ FOOD LABELS FOR SODIUM INFORMATION? The Nutrition Facts label lists the amount of sodium in one serving of the food. If you eat more than one serving, you must multiply the listed amount of sodium by the number of servings. Food labels may also identify foods as: 1. Sodium free--Less than 5 mg in a serving. 2. Very low sodium--35 mg or less in a serving. 3. Low sodium--140 mg or less in a serving. 4. Light in sodium--50% less sodium in a serving. For example, if a food that  usually has 300 mg of sodium is changed to become light in sodium, it will have 150 mg of sodium. 5. Reduced sodium--25% less sodium in a serving. For example, if a food that usually has 400 mg of sodium is changed to reduced sodium, it will have 300 mg of sodium. WHAT FOODS CAN I EAT? Grains Low-sodium cereals, including oats, puffed wheat and rice, and shredded  wheat cereals. Low-sodium crackers. Unsalted rice and pasta. Lower-sodium bread.  Vegetables Frozen or fresh vegetables. Low-sodium or reduced-sodium canned vegetables. Low-sodium or reduced-sodium tomato sauce and paste. Low-sodium or reduced-sodium tomato and vegetable juices.  Fruits Fresh, frozen, and canned fruit. Fruit juice.  Meat and Other Protein Products Low-sodium canned tuna and salmon. Fresh or frozen meat, poultry, seafood, and fish. Lamb. Unsalted nuts. Dried beans, peas, and lentils without added salt. Unsalted canned beans. Homemade soups without salt. Eggs.  Dairy Milk. Soy milk. Ricotta cheese. Low-sodium or reduced-sodium cheeses. Yogurt.  Condiments Fresh and dried herbs and spices. Salt-free seasonings. Onion and garlic powders. Low-sodium varieties of mustard and ketchup. Fresh or refrigerated horseradish. Lemon juice.  Fats and Oils Reduced-sodium salad dressings. Unsalted butter.  Other Unsalted popcorn and pretzels.  The items listed above may not be a complete list of recommended foods or beverages. Contact your dietitian for more options. WHAT FOODS ARE NOT RECOMMENDED? Grains Instant hot cereals. Bread stuffing, pancake, and biscuit mixes. Croutons. Seasoned rice or pasta mixes. Noodle soup cups. Boxed or frozen macaroni and cheese. Self-rising flour. Regular salted crackers. Vegetables Regular canned vegetables. Regular canned tomato sauce and paste. Regular tomato and vegetable juices. Frozen vegetables in sauces. Salted [place] fries. Olives. [place]. Relishes. Sauerkraut. Salsa. Meat and Other Protein Products Salted, canned, smoked, spiced, or pickled meats, seafood, or fish. Bacon, ham, sausage, hot dogs, corned beef, chipped beef, and packaged luncheon meats. Salt pork. Jerky. Pickled herring. Anchovies, regular canned tuna, and sardines. Salted nuts. Dairy Processed cheese and cheese spreads. Cheese curds. Blue cheese and cottage cheese.  Buttermilk.  Condiments Onion and garlic salt, seasoned salt, table salt, and sea salt. Canned and packaged gravies. Worcestershire sauce. Tartar sauce. Barbecue sauce. Teriyaki sauce. Soy sauce, including reduced sodium. Steak sauce. Fish sauce. Oyster sauce. Cocktail sauce. Horseradish that you find on the shelf. Regular ketchup and mustard. Meat flavorings and tenderizers. Bouillon cubes. Hot sauce. Tabasco sauce. Marinades. Taco seasonings. Relishes. Fats and Oils Regular salad dressings. Salted butter. Margarine. Ghee. Bacon fat.  Other Potato and tortilla chips. Corn chips and puffs. Salted popcorn and pretzels. Canned or dried soups. Pizza. Frozen entrees and pot pies.  The items listed above may not be a complete list of foods and beverages to avoid. Contact your dietitian for more information.   This information is not intended to replace advice given to you by your health care provider. Make sure you discuss any questions you have with your health care provider.   Document Released: 08/31/2001 Document Revised: 04/01/2014 Document Reviewed: 01/13/2013 Elsevier Interactive Patient Education 2016 Elsevier Inc.  - Heart-Healthy Eating Plan Heart-healthy meal planning includes: 29. Limiting unhealthy fats. 30. Increasing healthy fats. 31. Making other small dietary changes. You may need to talk with your doctor or a diet specialist (dietitian) to create an eating plan that is right for you. WHAT TYPES OF FAT SHOULD I CHOOSE? 6. Choose healthy fats. These include olive oil and canola oil, flaxseeds, walnuts, almonds, and seeds. 7. Eat more omega-3 fats. These include salmon, mackerel, sardines, tuna, flaxseed oil, and ground flaxseeds.  Try to eat fish at least twice each week. 8. Limit saturated fats. 1. Saturated fats are often found in animal products, such as meats, butter, and cream. 2. Plant sources of saturated fats include palm oil, palm kernel oil, and coconut  oil. 9. Avoid foods with partially hydrogenated oils in them. These include stick margarine, some tub margarines, cookies, crackers, and other baked goods. These contain trans fats. WHAT GENERAL GUIDELINES DO I NEED TO FOLLOW? 1. Check food labels carefully. Identify foods with trans fats or high amounts of saturated fat. 2. Fill one half of your plate with vegetables and green salads. Eat 4-5 servings of vegetables per day. A serving of vegetables is: 1. 1 cup of raw leafy vegetables. 2.  cup of raw or cooked cut-up vegetables. 3.  cup of vegetable juice. 3. Fill one fourth of your plate with whole grains. Look for the word "whole" as the first word in the ingredient list. 4. Fill one fourth of your plate with lean protein foods. 5. Eat 4-5 servings of fruit per day. A serving of fruit is: 1. One medium whole fruit. 2.  cup of dried fruit. 3.  cup of fresh, frozen, or canned fruit. 4.  cup of 100% fruit juice. 6. Eat more foods that contain soluble fiber. These include apples, broccoli, carrots, beans, peas, and barley. Try to get 20-30 g of fiber per day. 7. Eat more home-cooked food. Eat less restaurant, buffet, and fast food. 8. Limit or avoid alcohol. 9. Limit foods high in starch and sugar. 10. Avoid fried foods. 11. Avoid frying your food. Try baking, boiling, grilling, or broiling it instead. You can also reduce fat by: 1. Removing the skin from poultry. 2. Removing all visible fats from meats. 3. Skimming the fat off of stews, soups, and gravies before serving them. 4. Steaming vegetables in water or broth. 12. Lose weight if you are overweight. 13. Eat 4-5 servings of nuts, legumes, and seeds per week: 1. One serving of dried beans or legumes equals  cup after being cooked. 2. One serving of nuts equals 1 ounces. 3. One serving of seeds equals  ounce or one tablespoon. 14. You may need to keep track of how much salt or sodium you eat. This is especially true if you  have high blood pressure. Talk with your doctor or dietitian to get more information. WHAT FOODS CAN I EAT? Grains Breads, including [place], white, pita, wheat, raisin, rye, oatmeal, and [place]. Tortillas that are neither fried nor made with lard or trans fat. Low-fat rolls, including hotdog and hamburger buns and English muffins. Biscuits. Muffins. Waffles. Pancakes. Light popcorn. Whole-grain cereals. Flatbread. Melba toast. Pretzels. Breadsticks. Rusks. Low-fat snacks. Low-fat crackers, including oyster, saltine, matzo, graham, animal, and rye. Rice and pasta, including brown rice and pastas that are made with whole wheat.  Vegetables All vegetables.  Fruits All fruits, but limit coconut. Meats and Other Protein Sources Lean, well-trimmed beef, veal, pork, and lamb. Chicken and [place] without skin. All fish and shellfish. Wild duck, rabbit, pheasant, and venison. Egg whites or low-cholesterol egg substitutes. Dried beans, peas, lentils, and tofu. Seeds and most nuts. Dairy Low-fat or nonfat cheeses, including ricotta, string, and mozzarella. Skim or 1% milk that is liquid, powdered, or evaporated. Buttermilk that is made with low-fat milk. Nonfat or low-fat yogurt. Beverages Mineral water. Diet carbonated beverages. Sweets and Desserts Sherbets and fruit ices. Honey, jam, marmalade, jelly, and syrups. Meringues and gelatins. Pure sugar candy, such as hard  candy, jelly beans, gumdrops, mints, marshmallows, and small amounts of dark chocolate. [employer]. Eat all sweets and desserts in moderation. Fats and Oils Nonhydrogenated (trans-free) margarines. Vegetable oils, including soybean, sesame, sunflower, olive, peanut, safflower, corn, canola, and cottonseed. Salad dressings or mayonnaise made with a vegetable oil. Limit added fats and oils that you use for cooking, baking, salads, and as spreads. Other Cocoa powder. Coffee and tea. All seasonings and condiments. The items listed above  may not be a complete list of recommended foods or beverages. Contact your dietitian for more options. WHAT FOODS ARE NOT RECOMMENDED? Grains Breads that are made with saturated or trans fats, oils, or whole milk. Croissants. Butter rolls. Cheese breads. Sweet rolls. Donuts. Buttered popcorn. Chow mein noodles. High-fat crackers, such as cheese or butter crackers. Meats and Other Protein Sources Fatty meats, such as hotdogs, short ribs, sausage, spareribs, bacon, rib eye roast or steak, and mutton. High-fat deli meats, such as salami and bologna. Caviar. Domestic duck and goose. Organ meats, such as kidney, liver, sweetbreads, and heart. Dairy Cream, sour cream, cream cheese, and creamed cottage cheese. Whole-milk cheeses, including blue (bleu), [address], [place], [place], [address], [place], [address], cheddar, [place], and [employer]. Whole or 2% milk that is liquid, evaporated, or condensed. Whole buttermilk. Cream sauce or high-fat cheese sauce. Yogurt that is made from whole milk. Beverages Regular sodas and juice drinks with added sugar. Sweets and Desserts Frosting. Pudding. Cookies. Cakes other than angel food cake. Candy that has milk chocolate or white chocolate, hydrogenated fat, butter, coconut, or unknown ingredients. Buttered syrups. Full-fat ice cream or ice cream drinks. Fats and Oils Gravy that has suet, meat fat, or shortening. Cocoa butter, hydrogenated oils, palm oil, coconut oil, palm kernel oil. These can often be found in baked products, candy, fried foods, nondairy creamers, and whipped toppings. Solid fats and shortenings, including bacon fat, salt pork, lard, and butter. Nondairy cream substitutes, such as coffee creamers and sour cream substitutes. Salad dressings that are made of unknown oils, cheese, or sour cream. The items listed above may not be a complete list of foods and beverages to avoid. Contact your dietitian for more information.   This information is not intended  to replace advice given to you by your health care provider. Make sure you discuss any questions you have with your health care provider.   Document Released: 09/10/2011 Document Revised: 04/01/2014 Document Reviewed: 09/02/2013 Elsevier Interactive Patient Education 2016 [employer].  - You Can Quit Smoking If you are ready to quit smoking or are thinking about it, congratulations! You have chosen to help yourself be healthier and live longer! There are lots of different ways to quit smoking. Nicotine gum, nicotine patches, a nicotine inhaler, or nicotine nasal spray can help with physical craving. Hypnosis, support groups, and medicines help break the habit of smoking. TIPS TO GET OFF AND STAY OFF CIGARETTES 32. Learn to predict your moods. Do not let a bad situation be your excuse to have a cigarette. Some situations in your life might tempt you to have a cigarette. 47. Ask friends and co-workers not to smoke around you. 34. Make your home smoke-free. 35. Never have "just one" cigarette. It leads to wanting another and another. Remind yourself of your decision to quit. 36. On a card, make a list of your reasons for not smoking. Read it at least the same number of times a day as you have a cigarette. Tell yourself everyday, "I do not want to smoke.  I choose not to smoke." 37. Ask someone at home or work to help you with your plan to quit smoking. 38. Have something planned after you eat or have a cup of coffee. Take a walk or get other exercise to perk you up. This will help to keep you from overeating. 39. Try a relaxation exercise to calm you down and decrease your stress. Remember, you may be tense and nervous the first two weeks after you quit. This will pass. 40. Find new activities to keep your hands busy. Play with a pen, coin, or rubber band. Doodle or draw things on paper. 41. Brush your teeth right after eating. This will help cut down the craving for the taste of tobacco after meals.  You can try mouthwash too. 42. Try gum, breath mints, or diet candy to keep something in your mouth. IF YOU SMOKE AND WANT TO QUIT: 10. Do not stock up on cigarettes. Never buy a carton. Wait until one pack is finished before you buy another. 11. Never carry cigarettes with you at work or at home. 12. Keep cigarettes as far away from you as possible. Leave them with someone else. 13. Never carry matches or a lighter with you. 14. Ask yourself, "Do I need this cigarette or is this just a reflex?" 15. Bet with someone that you can quit. Put cigarette money in a piggy bank every morning. If you smoke, you give up the money. If you do not smoke, by the end of the week, you keep the money. 16. Keep trying. It takes 21 days to change a habit! 17. Talk to your doctor about using medicines to help you quit. These include nicotine replacement gum, lozenges, or skin patches.   This information is not intended to replace advice given to you by your health care provider. Make sure you discuss any questions you have with your health care provider.   Document Released: 01/05/2009 Document Revised: 06/03/2011 Document Reviewed: 01/05/2009 Elsevier Interactive Patient Education 2016 Elsevier Inc.  - Back Exercises If you have pain in your back, do these exercises 2-3 times each day or as told by your doctor. When the pain goes away, do the exercises once each day, but repeat the steps more times for each exercise (do more repetitions). If you do not have pain in your back, do these exercises once each day or as told by your doctor. EXERCISES Single Knee to Chest Do these steps 3-5 times in a row for each leg: 43. Lie on your back on a firm bed or the floor with your legs stretched out. 44. Bring one knee to your chest. 45. Hold your knee to your chest by grabbing your knee or thigh. 46. Pull on your knee until you feel a gentle stretch in your lower back. 47. Keep doing the stretch for 10-30  seconds. 48. Slowly let go of your leg and straighten it. Pelvic Tilt Do these steps 5-10 times in a row: 18. Lie on your back on a firm bed or the floor with your legs stretched out. 19. Bend your knees so they point up to the ceiling. Your feet should be flat on the floor. 20. Tighten your lower belly (abdomen) muscles to press your lower back against the floor. This will make your tailbone point up to the ceiling instead of pointing down to your feet or the floor. 21. Stay in this position for 5-10 seconds while you gently tighten your muscles and breathe evenly. Cat-Cow Do  these steps until your lower back bends more easily: 15. Get on your hands and knees on a firm surface. Keep your hands under your shoulders, and keep your knees under your hips. You may put padding under your knees. 16. Let your head hang down, and make your tailbone point down to the floor so your lower back is round like the back of a cat. 17. Stay in this position for 5 seconds. 18. Slowly lift your head and make your tailbone point up to the ceiling so your back hangs low (sags) like the back of a cow. 19. Stay in this position for 5 seconds. Press-Ups Do these steps 5-10 times in a row: 1. Lie on your belly (face-down) on the floor. 2. Place your hands near your head, about shoulder-width apart. 3. While you keep your back relaxed and keep your hips on the floor, slowly straighten your arms to raise the top half of your body and lift your shoulders. Do not use your back muscles. To make yourself more comfortable, you may change where you place your hands. 4. Stay in this position for 5 seconds. 5. Slowly return to lying flat on the floor. Bridges Do these steps 10 times in a row: 1. Lie on your back on a firm surface. 2. Bend your knees so they point up to the ceiling. Your feet should be flat on the floor. 3. Tighten your butt muscles and lift your butt off of the floor until your waist is almost as high as  your knees. If you do not feel the muscles working in your butt and the back of your thighs, slide your feet 1-2 inches farther away from your butt. 4. Stay in this position for 3-5 seconds. 5. Slowly lower your butt to the floor, and let your butt muscles relax. If this exercise is too easy, try doing it with your arms crossed over your chest. Belly Crunches Do these steps 5-10 times in a row: 1. Lie on your back on a firm bed or the floor with your legs stretched out. 2. Bend your knees so they point up to the ceiling. Your feet should be flat on the floor. 3. Cross your arms over your chest. 4. Tip your chin a little bit toward your chest but do not bend your neck. 5. Tighten your belly muscles and slowly raise your chest just enough to lift your shoulder blades a tiny bit off of the floor. 6. Slowly lower your chest and your head to the floor. Back Lifts Do these steps 5-10 times in a row: 1. Lie on your belly (face-down) with your arms at your sides, and rest your forehead on the floor. 2. Tighten the muscles in your legs and your butt. 3. Slowly lift your chest off of the floor while you keep your hips on the floor. Keep the back of your head in line with the curve in your back. Look at the floor while you do this. 4. Stay in this position for 3-5 seconds. 5. Slowly lower your chest and your face to the floor. GET HELP IF:  Your back pain gets a lot worse when you do an exercise.  Your back pain does not lessen 2 hours after you exercise. If you have any of these problems, stop doing the exercises. Do not do them again unless your doctor says it is okay. GET HELP RIGHT AWAY IF:  You have sudden, very bad back pain. If this happens, stop doing the  exercises. Do not do them again unless your doctor says it is okay.   This information is not intended to replace advice given to you by your health care provider. Make sure you discuss any questions you have with your health care  provider.   Document Released: 04/13/2010 Document Revised: 11/30/2014 Document Reviewed: 05/05/2014 Elsevier Interactive Patient Education [employer]2016 Elsevier Inc.
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
## 2015-08-24 ENCOUNTER — Emergency Department (HOSPITAL_COMMUNITY): Payer: BLUE CROSS/BLUE SHIELD

## 2015-08-24 ENCOUNTER — Encounter (HOSPITAL_COMMUNITY): Payer: Self-pay

## 2015-08-24 ENCOUNTER — Emergency Department (HOSPITAL_COMMUNITY)
Admission: EM | Admit: 2015-08-24 | Discharge: 2015-08-24 | Disposition: A | Discharge status: Home / Self Care | Payer: BLUE CROSS/BLUE SHIELD | Attending: Emergency Medicine | Admitting: Emergency Medicine

## 2015-08-24 DIAGNOSIS — M25511 Pain in right shoulder: Secondary | ICD-10-CM | POA: Diagnosis present

## 2015-08-24 DIAGNOSIS — X58XXXA Exposure to other specified factors, initial encounter: Secondary | ICD-10-CM | POA: Diagnosis not present

## 2015-08-24 DIAGNOSIS — S46911A Strain of unspecified muscle, fascia and tendon at shoulder and upper arm level, right arm, initial encounter: Secondary | ICD-10-CM | POA: Diagnosis not present

## 2015-08-24 DIAGNOSIS — I10 Essential (primary) hypertension: Secondary | ICD-10-CM | POA: Insufficient documentation

## 2015-08-24 DIAGNOSIS — Y939 Activity, unspecified: Secondary | ICD-10-CM | POA: Insufficient documentation

## 2015-08-24 DIAGNOSIS — Z791 Long term (current) use of non-steroidal anti-inflammatories (NSAID): Secondary | ICD-10-CM | POA: Diagnosis not present

## 2015-08-24 DIAGNOSIS — Z79899 Other long term (current) drug therapy: Secondary | ICD-10-CM | POA: Diagnosis not present

## 2015-08-24 DIAGNOSIS — Y929 Unspecified place or not applicable: Secondary | ICD-10-CM | POA: Insufficient documentation

## 2015-08-24 DIAGNOSIS — F17210 Nicotine dependence, cigarettes, uncomplicated: Secondary | ICD-10-CM | POA: Insufficient documentation

## 2015-08-24 DIAGNOSIS — Y999 Unspecified external cause status: Secondary | ICD-10-CM | POA: Insufficient documentation

## 2015-08-24 MED ORDER — ACETAMINOPHEN 500 MG PO TABS
1000.0000 mg | ORAL_TABLET | Freq: Once | ORAL | Status: AC
  Administered 2015-08-24: 1000 mg via ORAL
  Filled 2015-08-24: qty 2

## 2015-08-24 MED ORDER — IBUPROFEN 400 MG PO TABS
400.0000 mg | ORAL_TABLET | Freq: Once | ORAL | Status: AC
  Administered 2015-08-24: 400 mg via ORAL
  Filled 2015-08-24: qty 1
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
## 2015-08-24 NOTE — ED Notes (Signed)
Patient here with right shoulder pain x 1 day, pain with any ROM. No known trauma
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
## 2016-05-30 ENCOUNTER — Inpatient Hospital Stay: Payer: BLUE CROSS/BLUE SHIELD

## 2016-09-08 ENCOUNTER — Encounter (HOSPITAL_BASED_OUTPATIENT_CLINIC_OR_DEPARTMENT_OTHER): Payer: Self-pay

## 2016-09-08 ENCOUNTER — Emergency Department (HOSPITAL_BASED_OUTPATIENT_CLINIC_OR_DEPARTMENT_OTHER)
Admission: EM | Admit: 2016-09-08 | Discharge: 2016-09-09 | Disposition: A | Discharge status: Home / Self Care | Payer: BLUE CROSS/BLUE SHIELD | Attending: Emergency Medicine | Admitting: Emergency Medicine

## 2016-09-08 DIAGNOSIS — I10 Essential (primary) hypertension: Secondary | ICD-10-CM | POA: Insufficient documentation

## 2016-09-08 DIAGNOSIS — Z79899 Other long term (current) drug therapy: Secondary | ICD-10-CM | POA: Insufficient documentation

## 2016-09-08 DIAGNOSIS — F17210 Nicotine dependence, cigarettes, uncomplicated: Secondary | ICD-10-CM | POA: Diagnosis not present

## 2016-09-08 DIAGNOSIS — Y939 Activity, unspecified: Secondary | ICD-10-CM | POA: Insufficient documentation

## 2016-09-08 DIAGNOSIS — Y999 Unspecified external cause status: Secondary | ICD-10-CM | POA: Insufficient documentation

## 2016-09-08 DIAGNOSIS — Y929 Unspecified place or not applicable: Secondary | ICD-10-CM | POA: Insufficient documentation

## 2016-09-08 DIAGNOSIS — Z791 Long term (current) use of non-steroidal anti-inflammatories (NSAID): Secondary | ICD-10-CM | POA: Diagnosis not present

## 2016-09-08 DIAGNOSIS — S61011A Laceration without foreign body of right thumb without damage to nail, initial encounter: Secondary | ICD-10-CM | POA: Diagnosis not present

## 2016-09-08 DIAGNOSIS — W268XXA Contact with other sharp object(s), not elsewhere classified, initial encounter: Secondary | ICD-10-CM | POA: Insufficient documentation

## 2016-09-08 DIAGNOSIS — Z23 Encounter for immunization: Secondary | ICD-10-CM | POA: Diagnosis not present

## 2016-09-08 DIAGNOSIS — S6991XA Unspecified injury of right wrist, hand and finger(s), initial encounter: Secondary | ICD-10-CM | POA: Diagnosis present

## 2016-09-08 MED ORDER — LIDOCAINE HCL (PF) 1 % IJ SOLN
5.0000 mL | Freq: Once | INTRAMUSCULAR | Status: AC
  Administered 2016-09-08: 5 mL
  Filled 2016-09-08: qty 5

## 2016-09-08 MED ORDER — TETANUS-DIPHTH-ACELL PERTUSSIS 5-2.5-18.5 LF-MCG/0.5 IM SUSP
0.5000 mL | Freq: Once | INTRAMUSCULAR | Status: AC
  Administered 2016-09-08: 0.5 mL via INTRAMUSCULAR
  Filled 2016-09-08: qty 0.5

## 2016-09-08 NOTE — ED Notes (Signed)
Pt soaking lac at this time. Lac has slow drips of blood, no pulsatile flow noted.

## 2016-09-08 NOTE — ED Provider Notes (Signed)
[facility] Provider Note   CSN: [id number] Arrival date & time: 09/08/16  2152  By signing my name below, I, Doreatha Martin, attest that this documentation has been prepared under the direction and in the presence of Darly Fails, PA-C. Electronically Signed: Doreatha Martin, ED Scribe. 09/08/16. 11:00 PM.    History   Chief Complaint Chief Complaint  Patient presents with  . Extremity Laceration    HPI Caleb Heath is a 41 y.o. male who presents to the Emergency Department complaining of a moderately painful laceration to the right thumb that occurred 2 hours ago. Pt states he believes he cut the hand on a metal chair, but does not recall exactly what happened. He reports mild relief of pain with ice application. He reports distant h/o right hand fx that did not require surgery. He denies numbness, additional injuries. NKDA. Tetanus unknown.   The history is provided by the patient. No language interpreter was used.    Past Medical History:  Diagnosis Date  . Anxiety   . Fx wrist   . Hypertension   . Migraine     Patient Active Problem List   Diagnosis Date Noted  . HTN (hypertension) 09/02/2012  . Muscle cramps 09/02/2012    History reviewed. No pertinent surgical history.     Home Medications    Prior to Admission medications   Medication Sig Start Date End Date Taking? Authorizing Provider  amitriptyline (ELAVIL) 25 MG tablet Take 1 tablet (25 mg total) by mouth at bedtime as needed. For headache prevention Patient not taking: Reported on 08/16/2015 05/10/15   Ambrose Finland, NP  amLODipine (NORVASC) 10 MG tablet Take 1 tablet (10 mg total) by mouth daily. 08/16/15   Pete Glatter, MD  buPROPion (WELLBUTRIN SR) 150 MG 12 hr tablet Take 1 tablet (150 mg total) by mouth 2 (two) times daily. 08/16/15   Pete Glatter, MD  cephALEXin (KEFLEX) 500 MG capsule Take 1 capsule (500 mg total) by mouth 4 (four) times daily. 09/09/16 09/16/16  Michaeljames Milnes, Hillary Bow, PA-C    HYDROcodone-acetaminophen (NORCO/VICODIN) 5-325 MG tablet Take 2 tablets by mouth every 4 (four) hours as needed. 09/09/16   Melissa Tomaselli, PA-C  ibuprofen (ADVIL,MOTRIN) 600 MG tablet Take 1 tablet (600 mg total) by mouth every 6 (six) hours as needed. Patient not taking: Reported on 08/16/2015 06/08/15   Roxy Horseman, PA-C  lisinopril (PRINIVIL,ZESTRIL) 20 MG tablet Take 1 tablet (20 mg total) by mouth daily. 08/16/15   Pete Glatter, MD  naproxen (NAPROSYN) 250 MG tablet Take 250 mg by mouth as needed.    [provider]    Family History Family History  Problem Relation Age of Onset  . Hypertension Mother   . Cancer Father 1       colon    Social History Social History  Substance Use Topics  . Smoking status: Current Every Day Smoker    Packs/day: 0.50    Types: Cigarettes  . Smokeless tobacco: Never Used     Comment: .5 ppd  . Alcohol use 7.2 oz/week    12 Cans of beer per week     Comment: weekend     Allergies   Patient has no known allergies.   Review of Systems Review of Systems  Skin: Positive for wound.  Neurological: Negative for numbness.  All other systems reviewed and are negative.    Physical Exam Updated Vital Signs BP (!) 141/93 (BP Location: Left Arm)   Pulse  94   Temp 98.4 F (36.9 C) (Oral)   Resp 14   Ht 6\' 2"  (1.88 m)   Wt 76.2 kg (168 lb)   SpO2 97%   BMI 21.57 kg/m   Physical Exam  Constitutional: He appears well-developed and well-nourished. No distress.  HENT:  Head: Normocephalic and atraumatic.  Eyes: Conjunctivae and EOM are normal. No scleral icterus.  Neck: Normal range of motion.  Pulmonary/Chest: Effort normal. No respiratory distress.  Musculoskeletal:  ~3-4 cm laceration on palmar surface of R thumb. Bleeding controlled with gauze. Normal sensation. Pain with flexion of digit but otherwise ROM intact.  Neurological: He is alert.  Skin: No rash noted. He is not diaphoretic.  Psychiatric: He has a normal  mood and affect.  Nursing note and vitals reviewed.    ED Treatments / Results   COORDINATION OF CARE: 10:58 PM Discussed treatment plan with pt at bedside which includes wound care and pt agreed to plan.    Labs (all labs ordered are listed, but only abnormal results are displayed) Labs Reviewed - No data to display  EKG  EKG Interpretation None       Radiology No results found.  Procedures .Marland Kitchen.Laceration Repair Date/Time: 09/08/2016 11:04 PM Performed by: Dietrich PatesKHATRI, Trang Bouse Authorized by: Dietrich PatesKHATRI, Nyeli Holtmeyer   Consent:    Consent obtained:  Verbal   Consent given by:  Patient   Risks discussed:  Infection, pain, poor wound healing and poor cosmetic result   Alternatives discussed:  No treatment Universal protocol:    Procedure explained and questions answered to patient or proxy's satisfaction: yes     Immediately prior to procedure, a time out was called: yes     Patient identity confirmed:  Verbally with patient and arm band Anesthesia (see MAR for exact dosages):    Anesthesia method:  Nerve block   Block needle gauge:  25 G   Block anesthetic:  Lidocaine 1% w/o epi   Block injection procedure:  Anatomic landmarks palpated, anatomic landmarks identified, introduced needle, negative aspiration for blood and incremental injection   Block outcome:  Anesthesia achieved Laceration details:    Location:  Finger   Finger location:  R thumb   Length (cm):  3 Repair type:    Repair type:  Simple Pre-procedure details:    Preparation:  Patient was prepped and draped in usual sterile fashion and imaging obtained to evaluate for foreign bodies Exploration:    Wound exploration: wound explored through full range of motion and entire depth of wound probed and visualized     Contaminated: no   Treatment:    Area cleansed with:  Betadine and saline   Amount of cleaning:  Extensive   Irrigation solution:  Sterile saline Skin repair:    Repair method:  Sutures   Suture size:  4-0    Wound skin closure material used: Ethilon.   Number of sutures:  9 Approximation:    Approximation:  Close Post-procedure details:    Dressing:  Antibiotic ointment and sterile dressing   Patient tolerance of procedure:  Tolerated well, no immediate complications     (including critical care time)  Medications Ordered in ED Medications  lidocaine (PF) (XYLOCAINE) 1 % injection 5 mL (5 mLs Infiltration Given by Other 09/08/16 2314)  Tdap (BOOSTRIX) injection 0.5 mL (0.5 mLs Intramuscular Given 09/08/16 2311)     Initial Impression / Assessment and Plan / ED Course  I have reviewed the triage vital signs and the nursing notes.  Pertinent labs & imaging results that were available during my care of the patient were reviewed by me and considered in my medical decision making (see chart for details).     Tdap booster given. Pressure irrigation performed and explored for foreign bodies. No signs of deep tendon injury. Laceration occurred < 8 hours prior to repair which was well tolerated. Pt has no comorbidities to effect normal wound healing. Discussed suture home care w pt and answered questions. Advised to return for wound check and suture removal in 7 days. Given anx for prevention of infection, as patient unsure what caused laceration. Pain medication given. Pt is hemodynamically stable w no complaints prior to dc.     Final Clinical Impressions(s) / ED Diagnoses   Final diagnoses:  Laceration of right thumb without foreign body without damage to nail, initial encounter    New Prescriptions Discharge Medication List as of 09/09/2016 12:24 AM    START taking these medications   Details  HYDROcodone-acetaminophen (NORCO/VICODIN) 5-325 MG tablet Take 2 tablets by mouth every 4 (four) hours as needed., Starting Mon 09/09/2016, Print    cephALEXin (KEFLEX) 500 MG capsule Take 1 capsule (500 mg total) by mouth 4 (four) times daily., Starting Mon 09/09/2016, Until Mon 09/16/2016, Print         I personally performed the services described in this documentation, which was scribed in my presence. The recorded information has been reviewed and is accurate.    Dietrich Pates, PA-C 09/09/16 1336    Nira Conn, MD 09/11/16 [phone]

## 2016-09-08 NOTE — ED Triage Notes (Signed)
Reports right hand laceration while trying to block a vase.

## 2016-09-09 MED ORDER — CEPHALEXIN 500 MG PO CAPS: 500.0000 mg | ORAL_CAPSULE | Freq: Four times a day (QID) | ORAL | 0 refills | Status: DC

## 2016-09-09 MED ORDER — HYDROCODONE-ACETAMINOPHEN 5-325 MG PO TABS: 2.0000 | ORAL_TABLET | ORAL | 0 refills | Status: DC | PRN

## 2016-09-09 MED ORDER — CEPHALEXIN 500 MG PO CAPS: 500.0000 mg | ORAL_CAPSULE | Freq: Four times a day (QID) | ORAL | 0 refills | Status: AC

## 2016-09-09 NOTE — ED Notes (Signed)
Pt teaching provided on medications that may cause drowsiness. Pt instructed not to drive or operate heavy machinery while taking the prescribed medication. Pt verbalized understanding.   

## 2016-09-09 NOTE — Discharge Instructions (Signed)
Take Keflex 4 times daily as directed. Take pain medication as needed. Continue ibuprofen or Tylenol as needed. Follow-up with PCP for further evaluation. Return for suture removal in 7 days. Return to ED for worsening pain, numbness, additional injury, increased bleeding, lightheadedness, fevers.

## 2016-09-09 NOTE — ED Notes (Signed)
ED Provider at bedside to perform lac repair.

## 2016-10-07 ENCOUNTER — Ambulatory Visit: Payer: BLUE CROSS/BLUE SHIELD | Admitting: Family Medicine

## 2016-10-08 ENCOUNTER — Ambulatory Visit: Payer: BLUE CROSS/BLUE SHIELD | Attending: Family Medicine | Admitting: Family Medicine

## 2016-10-08 ENCOUNTER — Other Ambulatory Visit (HOSPITAL_COMMUNITY)
Admission: RE | Admit: 2016-10-08 | Discharge: 2016-10-08 | Disposition: A | Discharge status: Home / Self Care | Payer: BLUE CROSS/BLUE SHIELD | Source: Ambulatory Visit | Attending: Family Medicine | Admitting: Family Medicine

## 2016-10-08 ENCOUNTER — Ambulatory Visit: Payer: BLUE CROSS/BLUE SHIELD | Admitting: Licensed Clinical Social Worker

## 2016-10-08 VITALS — BP 138/88 | HR 95 | Ht 74.0 in | Wt 173.2 lb

## 2016-10-08 DIAGNOSIS — F17210 Nicotine dependence, cigarettes, uncomplicated: Secondary | ICD-10-CM | POA: Diagnosis not present

## 2016-10-08 DIAGNOSIS — I10 Essential (primary) hypertension: Secondary | ICD-10-CM | POA: Diagnosis not present

## 2016-10-08 DIAGNOSIS — Z113 Encounter for screening for infections with a predominantly sexual mode of transmission: Secondary | ICD-10-CM | POA: Diagnosis not present

## 2016-10-08 DIAGNOSIS — F419 Anxiety disorder, unspecified: Secondary | ICD-10-CM

## 2016-10-08 DIAGNOSIS — F410 Panic disorder [episodic paroxysmal anxiety] without agoraphobia: Secondary | ICD-10-CM | POA: Insufficient documentation

## 2016-10-08 DIAGNOSIS — Z8659 Personal history of other mental and behavioral disorders: Secondary | ICD-10-CM

## 2016-10-08 DIAGNOSIS — Z8669 Personal history of other diseases of the nervous system and sense organs: Secondary | ICD-10-CM

## 2016-10-08 LAB — POCT UA - MICROALBUMIN
Albumin/Creatinine Ratio, Urine, POC: <30
Creatinine, POC: 50 mg/dL
MICROALBUMIN (UR) POC: 10 mg/L

## 2016-10-08 MED ORDER — ASPIRIN-ACETAMINOPHEN-CAFFEINE 250-250-65 MG PO TABS: 2.0000 | ORAL_TABLET | Freq: Every day | ORAL | 0 refills | Status: DC | PRN

## 2016-10-08 MED ORDER — AMLODIPINE BESYLATE 5 MG PO TABS: 5.0000 mg | ORAL_TABLET | Freq: Every day | ORAL | 2 refills | Status: DC

## 2016-10-08 NOTE — BH Specialist Note (Signed)
Integrated Behavioral Health Initial Visit  MRN: [id number] Name: Caleb FantiJames Postel   Session Start time: 2:10 PM Session End time: 2:30 PM Total time: 20 minutes  Type of Service: Integrated Behavioral Health- Individual/Family Interpretor:No. Interpretor Name and Language: N/A   Warm Hand Off Completed.       SUBJECTIVE: Caleb Heath is a 41 y.o. male accompanied by patient. Patient was referred by FNP [place] for anxiety. Patient reports the following symptoms/concerns: feelings of sadness, difficulty sleeping, low energy, irritability, and panic attacks  Duration of problem: 2009; Severity of problem: moderate  OBJECTIVE: Mood: Anxious and Affect: Appropriate Risk of harm to self or others: No plan to harm self or others   LIFE CONTEXT: Family and Social: Pt receives strong support from family who reside locally School/Work: Pt is employed full time. He does not receive any public benefits Self-Care: Pt smokes cigarettes (.5 ppd) Life Changes: Pt recently began to have panic attacks, last one occurred in January 2018  GOALS ADDRESSED: Patient will reduce symptoms of: anxiety and increase knowledge and/or ability of: coping skills and also: Increase adequate support systems for patient/family   INTERVENTIONS: Solution-Focused Strategies, Supportive Counseling, Psychoeducation and/or Health Education and Link to [facility]  Standardized Assessments completed: PHQ 2&9  ASSESSMENT: Patient currently experiencing anxiety triggered by psychosocial stressors. He reports feelings of sadness, difficulty sleeping, low energy, irritability, and panic attacks. Patient reports strong support from family who resides locally. Patient may benefit from psychoeducation, psychotherapy, and medication management. LCSWA educated pt on how stress can negatively impact one's physical and mental health. LCSWA discussed benefits of applying healthy coping skills, which pt successfully  identified to utilize on a routine basis. He is not interested in psychotherapy or medication management at this time. LCSWA provided pt with educational materials on panic attacks and community resources for crisis intervention.   PLAN: 1. Follow up with behavioral health clinician on : Pt was encouraged to contact LCSWA if symptoms worsen or fail to improve to [facility] 2. Behavioral recommendations: LCSWA recommends that pt apply healthy coping skills discussed and utilize provided resources. Pt is encouraged to schedule follow up appointment with LCSWA 3. Referral(s):  4. "From scale of 1-10, how likely are you to follow plan?": 6/10  Bridgett LarssonJasmine D Naydeen Speirs, LCSW 10/10/16 9:39 AM

## 2016-10-08 NOTE — Progress Notes (Signed)
 Subjective:  Patient ID: Caleb Heath, male    DOB: 05/10/1975  Age: 40 y.o. MRN: [id number]  CC: Hypertension   HPI Caleb Heath presents for History of hypertension.  He is not exercising and is not adherent to low salt diet. He does not check BP at home. Reports being without his amlodipine for 2 months. Cardiac symptoms none. Patient denies chest pain, claudication, dyspnea, near-syncope, palpitations and syncope.  Cardiovascular risk factors: hypertension, male gender, sedentary lifestyle and smoking/ tobacco exposure. He is a current smoker 1/2 pack per day. He is not ready to quit at this He reports family history of hypertension-mother, aunts; MI, DM- maternal grandmother. Use of agents associated with hypertension: none. History of target organ damage: none. Patient complains of anxiety.  He has the following symptoms: chest pain, feelings of losing control, sob, dizziness, racing thoughts, and headaches.Onset of symptoms was approximately several months ago, unchanged since that time. He denies current suicidal and homicidal ideation. Family history significant for no psychiatric illness.Possible organic causes contributing are: none. Risk factors: patient reports previous episode of panic attack in January and received care at [facility]. He also reports finances and work as stressors. Previous treatment includes none. He is agreeable to speaking with LCSW at this time. He declines medication at this time. He request STD screening. Reports 1 sexual partner within the last 3 months. Denies any dysuria., penile lesions, or vaginal discharge.    Outpatient Medications Prior to Visit  Medication Sig Dispense Refill  . buPROPion (WELLBUTRIN SR) 150 MG 12 hr tablet Take 1 tablet (150 mg total) by mouth 2 (two) times daily. 60 tablet 2  . HYDROcodone-acetaminophen (NORCO/VICODIN) 5-325 MG tablet Take 2 tablets by mouth every 4 (four) hours as needed. 8 tablet 0  . naproxen (NAPROSYN)  250 MG tablet Take 250 mg by mouth as needed.    . amitriptyline (ELAVIL) 25 MG tablet Take 1 tablet (25 mg total) by mouth at bedtime as needed. For headache prevention (Patient not taking: Reported on 08/16/2015) 30 tablet 2  . amLODipine (NORVASC) 10 MG tablet Take 1 tablet (10 mg total) by mouth daily. 90 tablet 6  . ibuprofen (ADVIL,MOTRIN) 600 MG tablet Take 1 tablet (600 mg total) by mouth every 6 (six) hours as needed. (Patient not taking: Reported on 08/16/2015) 30 tablet 0  . lisinopril (PRINIVIL,ZESTRIL) 20 MG tablet Take 1 tablet (20 mg total) by mouth daily. 90 tablet 3   No facility-administered medications prior to visit.     ROS Review of Systems  Constitutional: Negative.   Eyes: Negative.   Respiratory: Negative.   Cardiovascular: Negative.   Gastrointestinal: Negative.   Genitourinary: Negative.   Skin: Negative.   Psychiatric/Behavioral: The patient is nervous/anxious.     Objective:  BP 138/88 (BP Location: Left Arm, Patient Position: Sitting, Cuff Size: Normal)   Pulse 95   Ht 6' 2" (1.88 m)   Wt 173 lb 3.2 oz (78.6 kg)   BMI 22.24 kg/m   BP/Weight 10/08/2016 09/09/2016 09/08/2016  Systolic BP 138 141 -  Diastolic BP 88 93 -  Wt. (Lbs) 173.2 - 168  BMI 22.24 - 21.57     Physical Exam  Constitutional: He is oriented to person, place, and time. He appears well-developed and well-nourished.  Eyes: Pupils are equal, round, and reactive to light. Conjunctivae are normal.  Neck: Normal range of motion. Neck supple. No JVD present.  Cardiovascular: Normal rate, regular rhythm, normal heart sounds and intact distal   pulses.   Pulmonary/Chest: Effort normal and breath sounds normal.  Abdominal: Soft. Bowel sounds are normal.  Musculoskeletal: Normal range of motion.  Neurological: He is alert and oriented to person, place, and time.  Skin: Skin is warm and dry.  Psychiatric: His mood appears anxious. He expresses no homicidal and no suicidal ideation. He  expresses no suicidal plans and no homicidal plans.  Nursing note and vitals reviewed.  Assessment & Plan:   Problem List Items Addressed This Visit      Cardiovascular and Mediastinum   HTN (hypertension) - Primary   Schedule BP recheck in 2 weeks with clinic RN.    If BP is greater than 90/60 (MAP 65 or greater) but not less than 130/80 may increase dose    of Amlodipine to 10 mg and recheck in another 2 weeks with clinic RN.   Follow-up with PCP in 2 months.    Relevant Medications   amLODipine (NORVASC) 5 MG tablet   Other Relevant Orders   POCT UA - Microalbumin (Completed)   CMP14+EGFR (Completed)   Lipid Panel (Completed)    Other Visit Diagnoses    Anxiety       Relevant Orders   TSH (Completed)   History of panic attacks       LCSW spoke with patient and provided resources.    History of migraine headaches       Relevant Medications   aspirin-acetaminophen-caffeine (EXCEDRIN MIGRAINE) 250-250-65 MG tablet   Screening for STDs (sexually transmitted diseases)       Relevant Orders   Urine cytology ancillary only (Completed)   HEP, RPR, HIV Panel (Completed)   HSV(herpes simplex vrs) 1+2 ab-IgG (Completed)      Meds ordered this encounter  Medications  . amLODipine (NORVASC) 5 MG tablet    Sig: Take 1 tablet (5 mg total) by mouth daily.    Dispense:  30 tablet    Refill:  2    Order Specific Question:   Supervising Provider    Answer:   Tresa Garter [id number]  . aspirin-acetaminophen-caffeine (EXCEDRIN MIGRAINE) 250-250-65 MG tablet    Sig: Take 2 tablets by mouth daily as needed for headache.    Dispense:  30 tablet    Refill:  0    Order Specific Question:   Supervising Provider    Answer:   Tresa Garter [id number]    Follow-up: Return in about 2 weeks (around 10/22/2016) for Bp check with Travia.   Alfonse Spruce FNP

## 2016-10-08 NOTE — Patient Instructions (Signed)
DASH Eating Plan DASH stands for "Dietary Approaches to Stop Hypertension." The DASH eating plan is a healthy eating plan that has been shown to reduce high blood pressure (hypertension). It may also reduce your risk for type 2 diabetes, heart disease, and stroke. The DASH eating plan may also help with weight loss. What are tips for following this plan? General guidelines  Avoid eating more than 2,300 mg (milligrams) of salt (sodium) a day. If you have hypertension, you may need to reduce your sodium intake to 1,500 mg a day.  Limit alcohol intake to no more than 1 drink a day for nonpregnant women and 2 drinks a day for men. One drink equals 12 oz of beer, 5 oz of wine, or 1 oz of hard liquor.  Work with your health care provider to maintain a healthy body weight or to lose weight. Ask what an ideal weight is for you.  Get at least 30 minutes of exercise that causes your heart to beat faster (aerobic exercise) most days of the week. Activities may include walking, swimming, or biking.  Work with your health care provider or diet and nutrition specialist (dietitian) to adjust your eating plan to your individual calorie needs. Reading food labels  Check food labels for the amount of sodium per serving. Choose foods with less than 5 percent of the Daily Value of sodium. Generally, foods with less than 300 mg of sodium per serving fit into this eating plan.  To find whole grains, look for the word "whole" as the first word in the ingredient list. Shopping  Buy products labeled as "low-sodium" or "no salt added."  Buy fresh foods. Avoid canned foods and premade or frozen meals. Cooking  Avoid adding salt when cooking. Use salt-free seasonings or herbs instead of table salt or sea salt. Check with your health care provider or pharmacist before using salt substitutes.  Do not fry foods. Cook foods using healthy methods such as baking, boiling, grilling, and broiling instead.  Cook with  heart-healthy oils, such as olive, canola, soybean, or sunflower oil. Meal planning   Eat a balanced diet that includes: ? 5 or more servings of fruits and vegetables each day. At each meal, try to fill half of your plate with fruits and vegetables. ? Up to 6-8 servings of whole grains each day. ? Less than 6 oz of lean meat, poultry, or fish each day. A 3-oz serving of meat is about the same size as a deck of cards. One egg equals 1 oz. ? 2 servings of low-fat dairy each day. ? A serving of nuts, seeds, or beans 5 times each week. ? Heart-healthy fats. Healthy fats called Omega-3 fatty acids are found in foods such as flaxseeds and coldwater fish, like sardines, salmon, and mackerel.  Limit how much you eat of the following: ? Canned or prepackaged foods. ? Food that is high in trans fat, such as fried foods. ? Food that is high in saturated fat, such as fatty meat. ? Sweets, desserts, sugary drinks, and other foods with added sugar. ? Full-fat dairy products.  Do not salt foods before eating.  Try to eat at least 2 vegetarian meals each week.  Eat more home-cooked food and less restaurant, buffet, and fast food.  When eating at a restaurant, ask that your food be prepared with less salt or no salt, if possible. What foods are recommended? The items listed may not be a complete list. Talk with your dietitian about what   dietary choices are best for you. Grains Whole-grain or whole-wheat bread. Whole-grain or whole-wheat pasta. Brown rice. Oatmeal. Quinoa. Bulgur. Whole-grain and low-sodium cereals. Pita bread. Low-fat, low-sodium crackers. Whole-wheat flour tortillas. Vegetables Fresh or frozen vegetables (raw, steamed, roasted, or grilled). Low-sodium or reduced-sodium tomato and vegetable juice. Low-sodium or reduced-sodium tomato sauce and tomato paste. Low-sodium or reduced-sodium canned vegetables. Fruits All fresh, dried, or frozen fruit. Canned fruit in natural juice (without  added sugar). Meat and other protein foods Skinless chicken or turkey. Ground chicken or turkey. Pork with fat trimmed off. Fish and seafood. Egg whites. Dried beans, peas, or lentils. Unsalted nuts, nut butters, and seeds. Unsalted canned beans. Lean cuts of beef with fat trimmed off. Low-sodium, lean deli meat. Dairy Low-fat (1%) or fat-free (skim) milk. Fat-free, low-fat, or reduced-fat cheeses. Nonfat, low-sodium ricotta or cottage cheese. Low-fat or nonfat yogurt. Low-fat, low-sodium cheese. Fats and oils Soft margarine without trans fats. Vegetable oil. Low-fat, reduced-fat, or light mayonnaise and salad dressings (reduced-sodium). Canola, safflower, olive, soybean, and sunflower oils. Avocado. Seasoning and other foods Herbs. Spices. Seasoning mixes without salt. Unsalted popcorn and pretzels. Fat-free sweets. What foods are not recommended? The items listed may not be a complete list. Talk with your dietitian about what dietary choices are best for you. Grains Baked goods made with fat, such as croissants, muffins, or some breads. Dry pasta or rice meal packs. Vegetables Creamed or fried vegetables. Vegetables in a cheese sauce. Regular canned vegetables (not low-sodium or reduced-sodium). Regular canned tomato sauce and paste (not low-sodium or reduced-sodium). Regular tomato and vegetable juice (not low-sodium or reduced-sodium). Pickles. Olives. Fruits Canned fruit in a light or heavy syrup. Fried fruit. Fruit in cream or butter sauce. Meat and other protein foods Fatty cuts of meat. Ribs. Fried meat. Bacon. Sausage. Bologna and other processed lunch meats. Salami. Fatback. Hotdogs. Bratwurst. Salted nuts and seeds. Canned beans with added salt. Canned or smoked fish. Whole eggs or egg yolks. Chicken or turkey with skin. Dairy Whole or 2% milk, cream, and half-and-half. Whole or full-fat cream cheese. Whole-fat or sweetened yogurt. Full-fat cheese. Nondairy creamers. Whipped toppings.  Processed cheese and cheese spreads. Fats and oils Butter. Stick margarine. Lard. Shortening. Ghee. Bacon fat. Tropical oils, such as coconut, palm kernel, or palm oil. Seasoning and other foods Salted popcorn and pretzels. Onion salt, garlic salt, seasoned salt, table salt, and sea salt. Worcestershire sauce. Tartar sauce. Barbecue sauce. Teriyaki sauce. Soy sauce, including reduced-sodium. Steak sauce. Canned and packaged gravies. Fish sauce. Oyster sauce. Cocktail sauce. Horseradish that you find on the shelf. Ketchup. Mustard. Meat flavorings and tenderizers. Bouillon cubes. Hot sauce and Tabasco sauce. Premade or packaged marinades. Premade or packaged taco seasonings. Relishes. Regular salad dressings. Where to find more information:  National Heart, Lung, and Blood Institute: www.nhlbi.nih.gov  American Heart Association: www.heart.org Summary  The DASH eating plan is a healthy eating plan that has been shown to reduce high blood pressure (hypertension). It may also reduce your risk for type 2 diabetes, heart disease, and stroke.  With the DASH eating plan, you should limit salt (sodium) intake to 2,300 mg a day. If you have hypertension, you may need to reduce your sodium intake to 1,500 mg a day.  When on the DASH eating plan, aim to eat more fresh fruits and vegetables, whole grains, lean proteins, low-fat dairy, and heart-healthy fats.  Work with your health care provider or diet and nutrition specialist (dietitian) to adjust your eating plan to your individual   calorie needs. This information is not intended to replace advice given to you by your health care provider. Make sure you discuss any questions you have with your health care provider. Document Released: 02/28/2011 Document Revised: 03/04/2016 Document Reviewed: 03/04/2016 Elsevier Interactive Patient Education  2017 [employer]  

## 2016-10-09 LAB — URINE CYTOLOGY ANCILLARY ONLY
Chlamydia: NEGATIVE
Neisseria Gonorrhea: NEGATIVE
Trichomonas: NEGATIVE

## 2016-10-10 ENCOUNTER — Other Ambulatory Visit: Payer: BLUE CROSS/BLUE SHIELD

## 2016-10-10 LAB — CMP14+EGFR
A/G RATIO: 2.1 (ref 1.2–2.2)
ALBUMIN: 4.6 g/dL (ref 3.5–5.5)
ALK PHOS: 55 IU/L (ref 39–117)
ALT: 10 IU/L (ref 0–44)
AST: 21 IU/L (ref 0–40)
BILIRUBIN TOTAL: 0.4 mg/dL (ref 0.0–1.2)
BUN / CREAT RATIO: 8 — AB (ref 9–20)
BUN: 7 mg/dL (ref 6–20)
CO2: 24 mmol/L (ref 20–29)
CREATININE: 0.89 mg/dL (ref 0.76–1.27)
Calcium: 9.7 mg/dL (ref 8.7–10.2)
Chloride: 102 mmol/L (ref 96–106)
GFR calc Af Amer: 125 mL/min/{1.73_m2} (ref >59)
GFR calc non Af Amer: 108 mL/min/{1.73_m2} (ref >59)
GLOBULIN, TOTAL: 2.2 g/dL (ref 1.5–4.5)
Glucose: 88 mg/dL (ref 65–99)
POTASSIUM: 4.4 mmol/L (ref 3.5–5.2)
SODIUM: 141 mmol/L (ref 134–144)
Total Protein: 6.8 g/dL (ref 6.0–8.5)

## 2016-10-10 LAB — HSV(HERPES SIMPLEX VRS) I + II AB-IGG
HSV 1 Glycoprotein G Ab, IgG: 37.1 index — ABNORMAL HIGH (ref 0.00–0.90)
HSV 2 Glycoprotein G Ab, IgG: <0.91 index (ref 0.00–0.90)

## 2016-10-10 LAB — HEP, RPR, HIV PANEL
HEP B S AG: NEGATIVE
HIV SCREEN 4TH GENERATION: NONREACTIVE
RPR Ser Ql: NONREACTIVE

## 2016-10-10 LAB — TSH: TSH: 0.31 u[IU]/mL — AB (ref 0.450–4.500)

## 2016-10-11 ENCOUNTER — Other Ambulatory Visit: Payer: BLUE CROSS/BLUE SHIELD

## 2016-10-11 ENCOUNTER — Ambulatory Visit: Payer: BLUE CROSS/BLUE SHIELD | Attending: Family Medicine

## 2016-10-11 LAB — URINE CYTOLOGY ANCILLARY ONLY
BACTERIAL VAGINITIS: NEGATIVE
CANDIDA VAGINITIS: NEGATIVE

## 2016-10-12 LAB — LIPID PANEL
CHOLESTEROL TOTAL: 149 mg/dL (ref 100–199)
Chol/HDL Ratio: 2.1 ratio (ref 0.0–5.0)
HDL: 71 mg/dL (ref >39)
LDL CALC: 68 mg/dL (ref 0–99)
Triglycerides: 52 mg/dL (ref 0–149)
VLDL Cholesterol Cal: 10 mg/dL (ref 5–40)

## 2016-10-13 ENCOUNTER — Encounter: Payer: Self-pay | Admitting: Family Medicine

## 2016-10-15 ENCOUNTER — Telehealth: Payer: Self-pay

## 2016-10-15 ENCOUNTER — Other Ambulatory Visit: Payer: Self-pay | Admitting: Family Medicine

## 2016-10-15 DIAGNOSIS — E0590 Thyrotoxicosis, unspecified without thyrotoxic crisis or storm: Secondary | ICD-10-CM

## 2016-10-15 NOTE — Telephone Encounter (Signed)
CMA call regarding lab results   Patient Verify DOB   Patient was aware and understood  

## 2016-10-15 NOTE — Telephone Encounter (Signed)
-----   Message from Lizbeth BarkMandesia R Hairston, FNP sent at 10/15/2016  1:50 PM EDT ----- Thyroid function panel shows hyperthyroidism. This can cause nervousness. Recommend scheduling lab appointment for follow up thyroid function panel test to further evaluate.  HIV, Hepatitis B, and syphilis are all negative. Gonorrhea, Chlamydia, BV, Yeast, and Trichomonas were all negative. Herpes type 1 which is primarily responsible for cold sores is positive. When you have cold sores do not kiss anyone, share utensils, or have oral sex. Herpes type 2 genital herpes is negative. Labs that evaluated your blood cells, fluid and electrolyte balance are normal. No signs of anemia, infection, or inflammation present.

## 2016-10-17 ENCOUNTER — Telehealth: Payer: Self-pay

## 2016-10-17 NOTE — Telephone Encounter (Signed)
-----   Message from Lizbeth BarkMandesia R Hairston, FNP sent at 10/15/2016  3:05 PM EDT ----- Cholesterol levels are normal.

## 2016-10-17 NOTE — Telephone Encounter (Signed)
CMA call regarding lab results  Patient Verify DOB  Patient  Was aware and understood

## 2016-10-18 ENCOUNTER — Ambulatory Visit: Payer: BLUE CROSS/BLUE SHIELD | Attending: Family Medicine

## 2016-10-18 DIAGNOSIS — E0590 Thyrotoxicosis, unspecified without thyrotoxic crisis or storm: Secondary | ICD-10-CM

## 2016-10-19 LAB — THYROID PANEL WITH TSH
FREE THYROXINE INDEX: 2.1 (ref 1.2–4.9)
T3 UPTAKE RATIO: 30 % (ref 24–39)
T4 TOTAL: 6.9 ug/dL (ref 4.5–12.0)
TSH: 0.379 u[IU]/mL — AB (ref 0.450–4.500)

## 2016-10-24 ENCOUNTER — Ambulatory Visit: Payer: BLUE CROSS/BLUE SHIELD | Attending: Family Medicine | Admitting: *Deleted

## 2016-10-24 VITALS — BP 120/80

## 2016-10-24 DIAGNOSIS — I10 Essential (primary) hypertension: Secondary | ICD-10-CM | POA: Diagnosis present

## 2016-10-24 DIAGNOSIS — Z0130 Encounter for examination of blood pressure without abnormal findings: Secondary | ICD-10-CM

## 2016-10-24 DIAGNOSIS — Z79899 Other long term (current) drug therapy: Secondary | ICD-10-CM | POA: Diagnosis not present

## 2016-10-24 NOTE — Progress Notes (Signed)
[facility] Pt alert and oriented and arrives in good spirits. Last OV 10/08/2016 with PCP M.hairston,FNP.   Pt denies chest pain, SOB, HA, dizziness, or blurred vision.  Verified medication. Pt states he does not take his medication daily.   Manual blood pressure reading:  120/74 and 118/74

## 2016-10-25 ENCOUNTER — Other Ambulatory Visit: Payer: Self-pay | Admitting: Family Medicine

## 2016-10-31 ENCOUNTER — Telehealth: Payer: Self-pay

## 2016-10-31 NOTE — Telephone Encounter (Signed)
-----   Message from Lizbeth BarkMandesia R Hairston, FNP sent at 10/31/2016  1:24 PM EDT ----- Thyroid test indicates subclinical hyperthyroidism. However thyroid levels have increased since from previous labs. Watchful waiting is recommended. Recommend follow up labs in 8 weeks. If levels do not improve then you'll be referred to thyroid imaging study and medication for hyperthyroidism will be prescribed.

## 2016-10-31 NOTE — Telephone Encounter (Signed)
CMA call regarding lab results   Patient Verify DOB   Patient was aware and undertsood

## 2016-11-07 ENCOUNTER — Encounter (HOSPITAL_COMMUNITY): Payer: Self-pay | Admitting: Emergency Medicine

## 2016-11-07 ENCOUNTER — Emergency Department (HOSPITAL_COMMUNITY): Payer: BLUE CROSS/BLUE SHIELD

## 2016-11-07 ENCOUNTER — Emergency Department (HOSPITAL_COMMUNITY)
Admission: EM | Admit: 2016-11-07 | Discharge: 2016-11-07 | Disposition: A | Discharge status: Home / Self Care | Payer: BLUE CROSS/BLUE SHIELD | Attending: Emergency Medicine | Admitting: Emergency Medicine

## 2016-11-07 DIAGNOSIS — F17210 Nicotine dependence, cigarettes, uncomplicated: Secondary | ICD-10-CM | POA: Diagnosis not present

## 2016-11-07 DIAGNOSIS — I10 Essential (primary) hypertension: Secondary | ICD-10-CM | POA: Insufficient documentation

## 2016-11-07 DIAGNOSIS — R0789 Other chest pain: Secondary | ICD-10-CM | POA: Diagnosis not present

## 2016-11-07 DIAGNOSIS — R079 Chest pain, unspecified: Secondary | ICD-10-CM

## 2016-11-07 LAB — BASIC METABOLIC PANEL
Anion gap: 11 (ref 5–15)
BUN: 5 mg/dL — ABNORMAL LOW (ref 6–20)
CHLORIDE: 105 mmol/L (ref 101–111)
CO2: 25 mmol/L (ref 22–32)
CREATININE: 0.74 mg/dL (ref 0.61–1.24)
Calcium: 9 mg/dL (ref 8.9–10.3)
GFR calc Af Amer: >60 mL/min (ref >60)
GFR calc non Af Amer: >60 mL/min (ref >60)
GLUCOSE: 90 mg/dL (ref 65–99)
Potassium: 3.6 mmol/L (ref 3.5–5.1)
SODIUM: 141 mmol/L (ref 135–145)

## 2016-11-07 LAB — CBC
HCT: 41.7 % (ref 39.0–52.0)
Hemoglobin: 14.4 g/dL (ref 13.0–17.0)
MCH: 32.4 pg (ref 26.0–34.0)
MCHC: 34.5 g/dL (ref 30.0–36.0)
MCV: 93.7 fL (ref 78.0–100.0)
PLATELETS: 211 10*3/uL (ref 150–400)
RBC: 4.45 MIL/uL (ref 4.22–5.81)
RDW: 13.5 % (ref 11.5–15.5)
WBC: 6.6 10*3/uL (ref 4.0–10.5)

## 2016-11-07 LAB — I-STAT TROPONIN, ED: Troponin i, poc: 0 ng/mL (ref 0.00–0.08)

## 2016-11-07 MED ORDER — METOPROLOL TARTRATE 5 MG/5ML IV SOLN
5.0000 mg | Freq: Once | INTRAVENOUS | Status: AC
  Administered 2016-11-07: 5 mg via INTRAVENOUS
  Filled 2016-11-07: qty 5

## 2016-11-07 MED ORDER — KETOROLAC TROMETHAMINE 15 MG/ML IJ SOLN
15.0000 mg | Freq: Once | INTRAMUSCULAR | Status: AC
  Administered 2016-11-07: 15 mg via INTRAVENOUS
  Filled 2016-11-07: qty 1

## 2016-11-07 MED ORDER — IOPAMIDOL (ISOVUE-300) INJECTION 61%
INTRAVENOUS | Status: AC
  Filled 2016-11-07: qty 75

## 2016-11-07 MED ORDER — SODIUM CHLORIDE 0.9 % IJ SOLN
INTRAMUSCULAR | Status: DC
Start: 2016-11-07 — End: 2016-11-07
  Filled 2016-11-07: qty 50

## 2016-11-07 MED ORDER — SODIUM CHLORIDE 0.9 % IV BOLUS (SEPSIS)
1000.0000 mL | Freq: Once | INTRAVENOUS | Status: AC
  Administered 2016-11-07: 1000 mL via INTRAVENOUS

## 2016-11-07 MED ORDER — IOPAMIDOL (ISOVUE-300) INJECTION 61%
75.0000 mL | Freq: Once | INTRAVENOUS | Status: AC | PRN
Start: 2016-11-07 — End: 2016-11-07
  Administered 2016-11-07: 75 mL via INTRAVENOUS

## 2016-11-07 NOTE — ED Provider Notes (Signed)
[facility]-EMERGENCY DEPT Provider Note   CSN: [id number] Arrival date & time: 11/07/16  0818     History   Chief Complaint Chief Complaint  Patient presents with  . Chest Pain    HPI Caleb Heath is a 41 y.o. male.  HPI   41 year old male with chest pain. Onset yesterday evening. Persistent since then. Worsening throughout the night. He describes the pain as sharp. He says that he often gets similar pain but that'll often resolve itself. He tends to notice more at night. He has not noticed any appreciable exacerbating relieving factors otherwise. Denies any other associated symptoms such as dyspnea, diaphoresis, nausea or lightheadedness. No unusual leg pain or swelling. Denies any past history of CAD. This is smoker. Denies drug use. Reports noncompliance with his medications because he is "skeptical." Denies recent surgical procedures immobilization, past history of DVT/PE, prolonged travel recently or cancer.  Past Medical History:  Diagnosis Date  . Anxiety   . Fx wrist   . Hypertension   . Migraine     Patient Active Problem List   Diagnosis Date Noted  . HTN (hypertension) 09/02/2012  . Muscle cramps 09/02/2012    No past surgical history on file.     Home Medications    Prior to Admission medications   Medication Sig Start Date End Date Taking? Authorizing Provider  amLODipine (NORVASC) 5 MG tablet Take 1 tablet (5 mg total) by mouth daily. Patient not taking: Reported on 10/24/2016 10/08/16   Lizbeth BarkHairston, Mandesia R, FNP  aspirin-acetaminophen-caffeine (EXCEDRIN MIGRAINE) [id number] MG tablet Take 2 tablets by mouth daily as needed for headache. Patient not taking: Reported on 10/24/2016 10/08/16   Lizbeth BarkHairston, Mandesia R, FNP  buPROPion [facility](WELLBUTRIN SR) 150 MG 12 hr tablet Take 1 tablet (150 mg total) by mouth 2 (two) times daily. Patient not taking: Reported on 10/24/2016 08/16/15   Pete GlatterLangeland, Dawn T, MD  HYDROcodone-acetaminophen (NORCO/VICODIN) 5-325 MG tablet Take 2  tablets by mouth every 4 (four) hours as needed. Patient not taking: Reported on 10/24/2016 09/09/16   Dietrich PatesKhatri, Hina, PA-C  naproxen (NAPROSYN) 250 MG tablet Take 250 mg by mouth as needed.    [provider]    Family History Family History  Problem Relation Age of Onset  . Hypertension Mother   . Cancer Father 3450       colon  . Hypertension Maternal Aunt   . Heart attack Maternal Grandmother   . Diabetes Maternal Grandmother     Social History Social History  Substance Use Topics  . Smoking status: Current Every Day Smoker    Packs/day: 0.50    Types: Cigarettes  . Smokeless tobacco: Never Used     Comment: .5 ppd  . Alcohol use 7.2 oz/week    12 Cans of beer per week     Comment: weekend     Allergies   Patient has no known allergies.   Review of Systems Review of Systems  All systems reviewed and negative, other than as noted in HPI.  Physical Exam Updated Vital Signs BP (!) 160/109   Pulse (!) 114   Temp 98 F (36.7 C)   Resp 17   SpO2 100%   Physical Exam  Constitutional: He appears well-developed and well-nourished. No distress.  HENT:  Head: Normocephalic and atraumatic.  Eyes: Conjunctivae are normal. Right eye exhibits no discharge. Left eye exhibits no discharge.  Neck: Neck supple.  Cardiovascular: Regular rhythm and normal heart sounds.  Exam reveals no gallop and no  friction rub.   No murmur heard. Mild tachycardia  Pulmonary/Chest: Effort normal and breath sounds normal. No respiratory distress.  Abdominal: Soft. He exhibits no distension. There is no tenderness.  Musculoskeletal: He exhibits no edema or tenderness.  Lower extremities symmetric as compared to each other. No calf tenderness. Negative Homan's. No palpable cords.   Neurological: He is alert.  Skin: Skin is warm and dry.  Psychiatric: He has a normal mood and affect. His behavior is normal. Thought content normal.  Nursing note and vitals reviewed.    ED Treatments  / Results  Labs (all labs ordered are listed, but only abnormal results are displayed) Labs Reviewed  BASIC METABOLIC PANEL  CBC  I-STAT TROPONIN, ED    EKG  EKG Interpretation  Date/Time:  Thursday November 07 2016 08:22:57 EDT Ventricular Rate:  115 PR Interval:    QRS Duration: 91 QT Interval:  310 QTC Calculation: 429 R Axis:   30 Text Interpretation:  Sinus tachycardia Probable left atrial enlargement Anterior infarct, old No significant change since last tracing Confirmed by Raeford Razor [phone]) on 11/07/2016 8:28:55 AM       Radiology No results found.  Procedures Procedures (including critical care time)  Medications Ordered in ED Medications - No data to display   Initial Impression / Assessment and Plan / ED Course  I have reviewed the triage vital signs and the nursing notes.  Pertinent labs & imaging results that were available during my care of the patient were reviewed by me and considered in my medical decision making (see chart for details).  41yM with CP. Seems atypical for ACS with sharp pain and constant since last night. EKG abnormal but unchanged. Resting tachycardia, but appears to have mildly elevated resting HR on previous evaluations. Possible subclinical hyperthyroidism based on recent labs. I doubt PE. No signs/symptoms of DVT or significant risk factors.   Vitals - 1 value per visit 11/07/2016 10/24/2016 10/08/2016 09/09/2016 09/08/2016  Pulse 114  95 94    Vitals - 1 value per visit 08/24/2015 08/16/2015 06/08/2015 05/10/2015  Pulse 92 93 108 97    Final Clinical Impressions(s) / ED Diagnoses   Final diagnoses:  Right-sided chest pain    New Prescriptions New Prescriptions   No medications on file     Raeford Razor, MD 11/22/16 1029

## 2016-11-07 NOTE — ED Notes (Signed)
Patient transported to CT 

## 2016-11-07 NOTE — ED Triage Notes (Signed)
Patient here with complaints of chest pain that started last night increased this morning. States that pain normally goes away. Pain nonradiating. Complains of headache also. No nausea/vomting. Stop taking BP medication 2 months ago.

## 2016-11-07 NOTE — ED Notes (Signed)
Pt A/O x4, ambulatory. Pt verbalizes understanding of d/c instructions and follow up care. All belongings with pt upon departure.

## 2016-12-09 ENCOUNTER — Encounter (HOSPITAL_COMMUNITY): Payer: Self-pay

## 2016-12-09 ENCOUNTER — Emergency Department (HOSPITAL_COMMUNITY): Payer: BLUE CROSS/BLUE SHIELD

## 2016-12-09 ENCOUNTER — Emergency Department (HOSPITAL_COMMUNITY)
Admission: EM | Admit: 2016-12-09 | Discharge: 2016-12-09 | Disposition: A | Discharge status: Home / Self Care | Payer: BLUE CROSS/BLUE SHIELD | Attending: Emergency Medicine | Admitting: Emergency Medicine

## 2016-12-09 DIAGNOSIS — R1032 Left lower quadrant pain: Secondary | ICD-10-CM | POA: Diagnosis not present

## 2016-12-09 DIAGNOSIS — M25552 Pain in left hip: Secondary | ICD-10-CM | POA: Insufficient documentation

## 2016-12-09 DIAGNOSIS — R109 Unspecified abdominal pain: Secondary | ICD-10-CM

## 2016-12-09 DIAGNOSIS — F17210 Nicotine dependence, cigarettes, uncomplicated: Secondary | ICD-10-CM | POA: Diagnosis not present

## 2016-12-09 HISTORY — DX: Disorder of thyroid, unspecified: E07.9

## 2016-12-09 LAB — COMPREHENSIVE METABOLIC PANEL
ALT: 22 U/L (ref 17–63)
AST: 39 U/L (ref 15–41)
Albumin: 4.8 g/dL (ref 3.5–5.0)
Alkaline Phosphatase: 68 U/L (ref 38–126)
Anion gap: 12 (ref 5–15)
BUN: 8 mg/dL (ref 6–20)
CO2: 26 mmol/L (ref 22–32)
Calcium: 9.7 mg/dL (ref 8.9–10.3)
Chloride: 104 mmol/L (ref 101–111)
Creatinine, Ser: 0.72 mg/dL (ref 0.61–1.24)
GFR calc Af Amer: >60 mL/min (ref >60)
GFR calc non Af Amer: >60 mL/min (ref >60)
Glucose, Bld: 91 mg/dL (ref 65–99)
Potassium: 3.8 mmol/L (ref 3.5–5.1)
Sodium: 142 mmol/L (ref 135–145)
Total Bilirubin: 0.4 mg/dL (ref 0.3–1.2)
Total Protein: 8.2 g/dL — ABNORMAL HIGH (ref 6.5–8.1)

## 2016-12-09 LAB — URINALYSIS, ROUTINE W REFLEX MICROSCOPIC
BACTERIA UA: NONE SEEN
Bilirubin Urine: NEGATIVE
Glucose, UA: NEGATIVE mg/dL
Ketones, ur: NEGATIVE mg/dL
Leukocytes, UA: NEGATIVE
Nitrite: NEGATIVE
PROTEIN: NEGATIVE mg/dL
RBC / HPF: NONE SEEN RBC/hpf (ref 0–5)
Specific Gravity, Urine: 1.002 — ABNORMAL LOW (ref 1.005–1.030)
Squamous Epithelial / LPF: NONE SEEN
pH: 5 (ref 5.0–8.0)

## 2016-12-09 LAB — CBC
HEMATOCRIT: 45.7 % (ref 39.0–52.0)
Hemoglobin: 16.3 g/dL (ref 13.0–17.0)
MCH: 32.5 pg (ref 26.0–34.0)
MCHC: 35.7 g/dL (ref 30.0–36.0)
MCV: 91 fL (ref 78.0–100.0)
Platelets: 241 10*3/uL (ref 150–400)
RBC: 5.02 MIL/uL (ref 4.22–5.81)
RDW: 13.1 % (ref 11.5–15.5)
WBC: 7.7 10*3/uL (ref 4.0–10.5)

## 2016-12-09 LAB — LIPASE, BLOOD: LIPASE: 46 U/L (ref 11–51)

## 2016-12-09 MED ORDER — KETOROLAC TROMETHAMINE 30 MG/ML IJ SOLN: 30.0000 mg | Freq: Once | INTRAMUSCULAR | Status: DC

## 2016-12-09 MED ORDER — METHOCARBAMOL 1000 MG/10ML IJ SOLN: 1000.0000 mg | Freq: Once | INTRAMUSCULAR | Status: DC

## 2016-12-09 MED ORDER — METHOCARBAMOL 1000 MG/10ML IJ SOLN
1000.0000 mg | Freq: Once | INTRAVENOUS | Status: AC
  Administered 2016-12-09: 1000 mg via INTRAVENOUS
  Filled 2016-12-09: qty 10

## 2016-12-09 MED ORDER — CYCLOBENZAPRINE HCL 10 MG PO TABS: 10.0000 mg | ORAL_TABLET | Freq: Two times a day (BID) | ORAL | 0 refills | Status: DC | PRN

## 2016-12-09 MED ORDER — NAPROXEN 375 MG PO TABS: 375.0000 mg | ORAL_TABLET | Freq: Two times a day (BID) | ORAL | 0 refills | Status: DC

## 2016-12-09 MED ORDER — KETOROLAC TROMETHAMINE 30 MG/ML IJ SOLN
15.0000 mg | Freq: Once | INTRAMUSCULAR | Status: AC
  Administered 2016-12-09: 15 mg via INTRAVENOUS
  Filled 2016-12-09: qty 1

## 2016-12-09 NOTE — ED Notes (Signed)
PT AWARE OF ELEVATED BP. PT NOT HAVING ANY SYMPTOMS. PT NOT CURRENTLY TAKING HIS BP MEDICATIONS. PT GIVEN EDUCATION ON RISK AND BENEFITS OF CONTROLLING BP. FOLLOW UP WITH BP. PT VERBALIZED UNDERSTANDING

## 2016-12-09 NOTE — ED Triage Notes (Addendum)
Per EMS, Pt, from home, c/o L hip pain x 2 days radiating into L abdomen today.  Pain score 10/10.  Denies hip injury.  Denies n/v/d.  Pt has not taken anything for symptoms.  Pt reports abdominal pain began after urinating.

## 2016-12-09 NOTE — ED Provider Notes (Signed)
[facility]-EMERGENCY DEPT Provider Note   CSN: [id number] Arrival date & time: 12/09/16  0045     History   Chief Complaint Chief Complaint  Patient presents with  . Hip Pain  . Abdominal Pain    HPI Caleb Heath is a 41 y.o. male.  HPI 41 year old African-American male past medical history significant for hypertension presents to the emergency department today with multiple complaints. Patient states that over the past 2 days he has had left flank pain, left hip pain. Patient states the pain started in his left lower back and radiated to his left hip. Also states that today he started having some L radiation of his pain to his left abdomen. States the pain is intermittent and worse with movement. States that sitting still makes the pain better. Patient not taking anything for his pain prior to arrival. Does report that his abdominal pain began after urinating today but denies any associated hematuria, urgency, frequency. The patient states that the pain will sometimes shoot down his left leg. Patient does report a history of kidney stones and sciatic pain. Patient denies any further urinary symptoms including urgency, frequency, hematuria, dysuria. Patient denies any associated fever, change in bowel habits, nausea, emesis. Pt has been ambulatory. Denies any trauma. Pt denies any ha, night sweats, hx of ivdu/cancer, loss or bowel or bladder, urinary retention, saddle paresthesias, lower extremity paresthesias.    Pt denies any fever, chill, ha, vision changes, lightheadedness, dizziness, congestion, neck pain, cp, sob, cough, n/v/d, urinary symptoms, change in bowel habits, melena, hematochezia, lower extremity paresthesias.  Past Medical History:  Diagnosis Date  . Anxiety   . Fx wrist   . Hypertension   . Migraine   . Thyroid disease     Patient Active Problem List   Diagnosis Date Noted  . HTN (hypertension) 09/02/2012  . Muscle cramps 09/02/2012    History reviewed. No  pertinent surgical history.     Home Medications    Prior to Admission medications   Medication Sig Start Date End Date Taking? Authorizing Provider  amLODipine (NORVASC) 5 MG tablet Take 1 tablet (5 mg total) by mouth daily. Patient not taking: Reported on 10/24/2016 10/08/16   Lizbeth Bark, FNP  aspirin-acetaminophen-caffeine (EXCEDRIN MIGRAINE) [id number] MG tablet Take 2 tablets by mouth daily as needed for headache. Patient not taking: Reported on 10/24/2016 10/08/16   Lizbeth Bark, FNP  buPROPion [facility] SR) 150 MG 12 hr tablet Take 1 tablet (150 mg total) by mouth 2 (two) times daily. Patient not taking: Reported on 10/24/2016 08/16/15   Pete Glatter, MD  HYDROcodone-acetaminophen (NORCO/VICODIN) 5-325 MG tablet Take 2 tablets by mouth every 4 (four) hours as needed. Patient not taking: Reported on 10/24/2016 09/09/16   Dietrich Pates, PA-C    Family History Family History  Problem Relation Age of Onset  . Hypertension Mother   . Cancer Father 60       colon  . Hypertension Maternal Aunt   . Heart attack Maternal Grandmother   . Diabetes Maternal Grandmother     Social History Social History  Substance Use Topics  . Smoking status: Current Every Day Smoker    Packs/day: 0.50    Types: Cigarettes  . Smokeless tobacco: Never Used     Comment: .5 ppd  . Alcohol use 7.2 oz/week    12 Cans of beer per week     Comment: weekend     Allergies   Patient has no known allergies.  Review of Systems Review of Systems  Constitutional: Negative for chills and fever.  HENT: Negative for congestion and sore throat.   Eyes: Negative for visual disturbance.  Respiratory: Negative for cough and shortness of breath.   Cardiovascular: Negative for chest pain.  Gastrointestinal: Positive for abdominal pain. Negative for diarrhea, nausea and vomiting.  Genitourinary: Positive for flank pain. Negative for dysuria, frequency, hematuria, scrotal swelling, testicular  pain and urgency.  Musculoskeletal: Positive for arthralgias and back pain. Negative for myalgias.  Skin: Negative for rash.  Neurological: Negative for dizziness, syncope, weakness, light-headedness, numbness and headaches.  Psychiatric/Behavioral: Negative for sleep disturbance. The patient is not nervous/anxious.      Physical Exam Updated Vital Signs BP 120/75 (BP Location: Left Arm)   Pulse 97   Temp 97.8 F (36.6 C) (Oral)   Resp 18   Ht  (1.88 m)   Wt 76.2 kg (168 lb)   SpO2 98%   BMI 21.57 kg/m   Physical Exam  Constitutional: He is oriented to person, place, and time. He appears well-developed and well-nourished.  Non-toxic appearance. No distress.  HENT:  Head: Normocephalic and atraumatic.  Mouth/Throat: Oropharynx is clear and moist.  Eyes: Pupils are equal, round, and reactive to light. Conjunctivae are normal. Right eye exhibits no discharge. Left eye exhibits no discharge.  Neck: Normal range of motion. Neck supple.  Cardiovascular: Normal rate, regular rhythm, normal heart sounds and intact distal pulses.  Exam reveals no gallop and no friction rub.   No murmur heard. Pulmonary/Chest: Effort normal and breath sounds normal. No respiratory distress. He has no wheezes. He has no rales. He exhibits no tenderness.  Abdominal: Soft. Bowel sounds are normal. He exhibits no distension. There is no tenderness. There is no rigidity, no rebound, no guarding, no CVA tenderness, no tenderness at McBurney's point and negative Murphy's sign.  Musculoskeletal: Normal range of motion. He exhibits no tenderness.  No midline T spine or L spine tenderness. No deformities or step offs noted. Full ROM. Pelvis is stable.  Postiive straight leg raise test.  DP pulses are 2+ bilaterally. Cap refill normal.  Left lumer paraspinal tenderness to palpation  Lymphadenopathy:    He has no cervical adenopathy.  Neurological: He is alert and oriented to person, place, and time.    Strength 5 out of 5 in lower extremities. Sensation intact in all dermatomes. Patellar reflexes are normal.  Skin: Skin is warm and dry. Capillary refill takes less than 2 seconds. No rash noted.  Psychiatric: His behavior is normal. Judgment and thought content normal.  Nursing note and vitals reviewed.    ED Treatments / Results  Labs (all labs ordered are listed, but only abnormal results are displayed) Labs Reviewed  COMPREHENSIVE METABOLIC PANEL - Abnormal; Notable for the following:       Result Value   Total Protein 8.2 (*)    All other components within normal limits  URINALYSIS, ROUTINE W REFLEX MICROSCOPIC - Abnormal; Notable for the following:    Color, Urine COLORLESS (*)    Specific Gravity, Urine 1.002 (*)    Hgb urine dipstick SMALL (*)    All other components within normal limits  LIPASE, BLOOD  CBC    EKG  EKG Interpretation None       Radiology Ct Renal Stone Study  Result Date: 12/09/2016 CLINICAL DATA:  Left flank pain for 2 days . EXAM: CT ABDOMEN AND PELVIS WITHOUT CONTRAST TECHNIQUE: Multidetector CT imaging of the abdomen and  pelvis was performed following the standard protocol without IV contrast. COMPARISON:  02/27/2015 FINDINGS: Lower chest: No acute findings. Hepatobiliary: No mass visualized on this unenhanced exam. Gallbladder is unremarkable. Pancreas: No mass or inflammatory process visualized on this unenhanced exam. Spleen:  Within normal limits in size. Adrenals/Urinary tract: No evidence of urolithiasis or hydronephrosis. Unremarkable unopacified urinary bladder. Stable fluid attenuation cyst seen along lateral lower pole of the left kidney. A partially calcified lesion is seen in the midpole of the right kidney which measures 2.0 cm and is not significantly changed since prior study. Stomach/Bowel: No evidence of obstruction, inflammatory process, or abnormal fluid collections. Vascular/Lymphatic: No pathologically enlarged lymph nodes  identified. No evidence of abdominal aortic aneurysm. Aortic atherosclerosis. Reproductive:  No mass or other significant abnormality. Other:  None. Musculoskeletal:  No suspicious bone lesions identified. IMPRESSION: No evidence of urolithiasis, hydronephrosis, or other acute findings. 2 cm partially calcified lesion in right kidney remains stable in size since previous study, however low-grade renal neoplasm cannot be excluded. Recommend further characterization with non-emergent abdomen MRI without and with contrast as the preferred exam. Aortic atherosclerosis. Electronically Signed   By: Myles Rosenthal M.D.   On: 12/09/2016 08:05    Procedures Procedures (including critical care time)  Medications Ordered in ED Medications  ketorolac (TORADOL) 30 MG/ML injection 15 mg (15 mg Intravenous Given 12/09/16 0601)  methocarbamol (ROBAXIN) 1,000 mg in dextrose 5 % 50 mL IVPB (0 mg Intravenous Stopped 12/09/16 1610)     Initial Impression / Assessment and Plan / ED Course  I have reviewed the triage vital signs and the nursing notes.  Pertinent labs & imaging results that were available during my care of the patient were reviewed by me and considered in my medical decision making (see chart for details).     Pt presents to the ED with complaints of left hip pain that radiates to the left flank and left abd. Pt denies any associated urinary symptoms, fever, n/v/d, or injury.  Pt is overall well appearing and non toxic. Pt does not appear to be in acute distress. Ambulatory with no distress.  Vital signs reassuring. Abd exam soft and nontender. Lungs ctab. No focal neuro deficits. No red flag symptoms concerning for cauda equina.   Labs reassuring. Normal kidney function. UA with small hgb no other findings or sign of infection.  Concern for possible kidney stone. CT renal stone ordered  Ct returned with no acute findings. Chronics findings dicussed with pt and encouraged to follow up with mri and  pcp.   Pt pain improved with robaxin and tordol. Symptoms likely sciatic pain vs msk. Will treat with nsaids and muscle relaxer. Encouraged symptomatic treatment at home. Presentation does not seem consistent with uti, peyleo, renal infarct.   Repeat abd exam is benign.   Pt is hemodynamically stable, in NAD, & able to ambulate in the ED. Evaluation does not show pathology that would require ongoing emergent intervention or inpatient treatment. I explained the diagnosis to the patient. Pain has been managed & has no complaints prior to dc. Pt is comfortable with above plan and is stable for discharge at this time. All questions were answered prior to disposition. Strict return precautions for f/u to the ED were discussed. Encouraged follow up with PCP.   Final Clinical Impressions(s) / ED Diagnoses   Final diagnoses:  Left hip pain  Left flank pain  Left lower quadrant pain    New Prescriptions Discharge Medication List as of 12/09/2016  8:21 AM    START taking these medications   Details  cyclobenzaprine (FLEXERIL) 10 MG tablet Take 1 tablet (10 mg total) by mouth 2 (two) times daily as needed for muscle spasms., Starting Mon 12/09/2016, Print    naproxen (NAPROSYN) 375 MG tablet Take 1 tablet (375 mg total) by mouth 2 (two) times daily., Starting Mon 12/09/2016, Print         Rise Mu, PA-C 12/10/16 1538    Palumbo, April, MD 12/12/16 6045

## 2016-12-09 NOTE — ED Notes (Signed)
Pt offered ibuprofen and reports he can only take Naproxen.  Pt informed I'm unable to provide that medication at this time.  Pt verbalized understanding.

## 2016-12-09 NOTE — ED Notes (Signed)
WORK NOTE GIVEN 

## 2016-12-09 NOTE — Discharge Instructions (Signed)
Your lab work has been reassuring. Your CT scan is reassuring. I have discussed the findings of cyst on the right kidney that needs to be followed up by urology and primary care. Your symptoms seem consistent with possible musculoskeletal pain or sciatic pain. Has a prescription for muscle relaxers and anti-inflammatories.  Please take the Naproxen as prescribed for pain. Do not take any additional NSAIDs including Motrin, Aleve, Ibuprofen, Advil.  Please the the Flexeril for muscle relaxation. This medication will make you drowsy so avoid situation that could place you in danger.   Please rest, ice, compress and elevated the affected body part to help with swelling and pain.  Warm compresses with warm soaks in Epson salt.

## 2016-12-09 NOTE — ED Notes (Signed)
Bed: [facility] Expected date:  Expected time:  Means of arrival:  Comments: 

## 2016-12-09 NOTE — ED Notes (Signed)
Bed: [facility] Expected date:  Expected time:  Means of arrival:  Comments: 

## 2016-12-09 NOTE — ED Notes (Addendum)
Patient is ambulatory to room from triage without difficulty.

## 2016-12-09 NOTE — ED Notes (Signed)
Patient transported to CT 

## 2017-03-07 ENCOUNTER — Ambulatory Visit: Payer: Self-pay | Attending: Family Medicine | Admitting: Family Medicine

## 2017-03-07 ENCOUNTER — Encounter: Payer: Self-pay | Admitting: Family Medicine

## 2017-03-07 VITALS — BP 152/92 | HR 97 | Temp 98.3°F | Resp 18 | Ht 74.0 in | Wt 176.8 lb

## 2017-03-07 DIAGNOSIS — K648 Other hemorrhoids: Secondary | ICD-10-CM | POA: Insufficient documentation

## 2017-03-07 DIAGNOSIS — I10 Essential (primary) hypertension: Secondary | ICD-10-CM | POA: Insufficient documentation

## 2017-03-07 DIAGNOSIS — Z800 Family history of malignant neoplasm of digestive organs: Secondary | ICD-10-CM | POA: Insufficient documentation

## 2017-03-07 DIAGNOSIS — Z79899 Other long term (current) drug therapy: Secondary | ICD-10-CM | POA: Insufficient documentation

## 2017-03-07 DIAGNOSIS — Z113 Encounter for screening for infections with a predominantly sexual mode of transmission: Secondary | ICD-10-CM | POA: Insufficient documentation

## 2017-03-07 LAB — HEMOCCULT GUIAC POC 1CARD (OFFICE): Fecal Occult Blood, POC: NEGATIVE

## 2017-03-07 MED ORDER — LOSARTAN POTASSIUM 50 MG PO TABS: 50.0000 mg | ORAL_TABLET | Freq: Every day | ORAL | 2 refills | Status: DC

## 2017-03-07 MED ORDER — HYDROCORTISONE 2.5 % RE CREA: 1.0000 "application " | TOPICAL_CREAM | Freq: Two times a day (BID) | RECTAL | 1 refills | Status: DC

## 2017-03-07 MED ORDER — POLYETHYLENE GLYCOL 3350 17 GM/SCOOP PO POWD: 17.0000 g | Freq: Every day | ORAL | 0 refills | Status: DC | PRN

## 2017-03-07 NOTE — Progress Notes (Signed)
Subjective:  Patient ID: Caleb Heath, male    DOB: 04/23/1975  Age: 41 y.o. MRN: [id number]  CC: Hypertension   HPI Caleb Heath presents for STD screening. He denies any penile lesion, dysuria, or penile discharge. He reports having sex with an ex-girlfriend unprotected recently. He reports 2 sexual partners within th last 2 months. Hemorrhoids:  Patient complains of evaluation of possible hemorrhoids. Onset of symptoms was  2 weeks ago ago with unchanged course since that time.  He describes symptoms as painful defecation. Treatment to date has been OTC creams: somewhat effective. Patient denies melena and receptive anal intercourse.He does report family history of colon CA- father. Hypertension: He reports headaches with use of amlodipine.  He denies taking amlopdine for BP. He does not check BP at home. Cardiac symptoms none. Patient denies chest pain, chest pressure/discomfort, claudication, dyspnea, near-syncope, palpitations and syncope.  Cardiovascular risk factors: hypertension, male gender and sedentary lifestyle. Use of agents associated with hypertension: none. History of target organ damage: none.   Outpatient Medications Prior to Visit  Medication Sig Dispense Refill  . amLODipine (NORVASC) 5 MG tablet Take 1 tablet (5 mg total) by mouth daily. (Patient not taking: Reported on 10/24/2016) 30 tablet 2  . aspirin-acetaminophen-caffeine (EXCEDRIN MIGRAINE) 250-250-65 MG tablet Take 2 tablets by mouth daily as needed for headache. (Patient not taking: Reported on 10/24/2016) 30 tablet 0  . buPROPion (WELLBUTRIN SR) 150 MG 12 hr tablet Take 1 tablet (150 mg total) by mouth 2 (two) times daily. (Patient not taking: Reported on 10/24/2016) 60 tablet 2  . cyclobenzaprine (FLEXERIL) 10 MG tablet Take 1 tablet (10 mg total) by mouth 2 (two) times daily as needed for muscle spasms. (Patient not taking: Reported on 03/07/2017) 10 tablet 0  . HYDROcodone-acetaminophen (NORCO/VICODIN) 5-325 MG tablet  Take 2 tablets by mouth every 4 (four) hours as needed. (Patient not taking: Reported on 10/24/2016) 8 tablet 0  . naproxen (NAPROSYN) 375 MG tablet Take 1 tablet (375 mg total) by mouth 2 (two) times daily. (Patient not taking: Reported on 03/07/2017) 20 tablet 0   No facility-administered medications prior to visit.     ROS Review of Systems  Constitutional: Negative.   Respiratory: Negative.   Cardiovascular: Negative.   Gastrointestinal:       Hemorrhoids  Genitourinary: Negative for discharge, dysuria and genital sores.   Objective:  BP (!) 152/92 (BP Location: Left Arm, Patient Position: Sitting, Cuff Size: Normal)   Pulse 97   Temp 98.3 F (36.8 C) (Oral)   Resp 18   Ht 6\' 2"  (1.88 m)   Wt 176 lb 12.8 oz (80.2 kg)   SpO2 99%   BMI 22.70 kg/m   BP/Weight 03/07/2017 12/09/2016 11/07/2016  Systolic BP 152 162 154  Diastolic BP 92 100 117  Wt. (Lbs) 176.8 168 -  BMI 22.7 21.57 -   Physical Exam  Constitutional: He appears well-developed and well-nourished.  Eyes: Conjunctivae are normal. Pupils are equal, round, and reactive to light.  Neck: No JVD present.  Cardiovascular: Normal rate, regular rhythm, normal heart sounds and intact distal pulses.  Pulmonary/Chest: Effort normal and breath sounds normal.  Abdominal: Soft. Bowel sounds are normal.  Genitourinary: Rectal exam shows internal hemorrhoid and tenderness. Rectal exam shows no mass and guaiac negative stool.  Skin: Skin is warm and dry.  Nursing note and vitals reviewed.  Assessment & Plan:   1. HTN, goal below 130/80 Schedule BP recheck in 2 weeks with nurse. If BP  is greater than 90/60 (MAP 65 or greater) but not less than 130/80 may increase dose of  Losartan to 100 mg QD and recheck in another 2 weeks.   - losartan (COZAAR) 50 MG tablet; Take 1 tablet (50 mg total) by mouth daily.  Dispense: 30 tablet; Refill: 2  2. Screening for STDs (sexually transmitted diseases)  - HEP, RPR, HIV Panel - Urine  cytology ancillary only - HSV(herpes simplex vrs) 1+2 ab-IgG; Future  3. Hemorrhoids, internal Stool occult (-) - Hemoccult - 1 Card (office) - hydrocortisone (ANUSOL-HC) 2.5 % rectal cream; Place 1 application rectally 2 (two) times daily.  Dispense: 30 g; Refill: 1 - polyethylene glycol powder (GLYCOLAX/MIRALAX) powder; Take 17 g by mouth daily as needed for mild constipation or moderate constipation.  Dispense: 3350 g; Refill: 0      Follow-up: Return in about 2 weeks (around 03/21/2017) for BP check with [facility].   Lizbeth BarkMandesia R Julianne Chamberlin FNP

## 2017-03-07 NOTE — Patient Instructions (Signed)
Hemorrhoids    Hemorrhoids are swollen veins in and around the rectum or anus. Hemorrhoids can cause pain, itching, or bleeding. Most of the time, they do not cause serious problems. They usually get better with diet changes, lifestyle changes, and other home treatments.  Follow these instructions at home:  Eating and drinking  · Eat foods that have fiber, such as whole grains, beans, nuts, fruits, and vegetables. Ask your doctor about taking products that have added fiber (fiber supplements).  · Drink enough fluid to keep your pee (urine) clear or pale yellow.  For Pain and Swelling  · Take a warm-water bath (sitz bath) for 20 minutes to ease pain. Do this 3-4 times a day.  · If directed, put ice on the painful area. It may be helpful to use ice between your warm baths.  ¨ Put ice in a plastic bag.  ¨ Place a towel between your skin and the bag.  ¨ Leave the ice on for 20 minutes, 2-3 times a day.  General instructions  · Take over-the-counter and prescription medicines only as told by your doctor.  ¨ Medicated creams and medicines that are inserted into the anus (suppositories) may be used or applied as told.  · Exercise often.  · Go to the bathroom when you have the urge to poop (to have a bowel movement). Do not wait.  · Avoid pushing too hard (straining) when you poop.  · Keep the butt area dry and clean. Use wet toilet paper or moist paper towels.  · Do not sit on the toilet for a long time.  Contact a doctor if:  · You have any of these:  ¨ Pain and swelling that do not get better with treatment or medicine.  ¨ Bleeding that will not stop.  ¨ Trouble pooping or you cannot poop.  ¨ Pain or swelling outside the area of the hemorrhoids.  This information is not intended to replace advice given to you by your health care provider. Make sure you discuss any questions you have with your health care provider.  Document Released: 12/19/2007 Document Revised: 08/17/2015 Document Reviewed: 11/23/2014  Elsevier  Interactive Patient Education © 2018 [employer]   

## 2017-03-07 NOTE — Progress Notes (Signed)
Patient is here for hypertension. Patient stated he is not taking any medication because he said it didn't feel good with it.

## 2017-03-08 LAB — HEP, RPR, HIV PANEL
HIV Screen 4th Generation wRfx: NONREACTIVE
Hepatitis B Surface Ag: NEGATIVE
RPR Ser Ql: NONREACTIVE

## 2017-03-10 LAB — URINE CYTOLOGY ANCILLARY ONLY
CHLAMYDIA, DNA PROBE: NEGATIVE
Neisseria Gonorrhea: NEGATIVE
TRICH (WINDOWPATH): NEGATIVE

## 2017-03-11 ENCOUNTER — Encounter: Payer: Self-pay | Admitting: Family Medicine

## 2017-03-11 LAB — URINE CYTOLOGY ANCILLARY ONLY
BACTERIAL VAGINITIS: NEGATIVE
Candida vaginitis: NEGATIVE

## 2017-03-13 NOTE — Telephone Encounter (Signed)
-----   Message from Lizbeth BarkMandesia R Hairston, FNP sent at 03/13/2017  1:38 PM EST ----- HIV, Hepatitis B, and syphilis are all negative. Gonorrhea, Chlamydia, BV, Yeast and Trichomonas were all negative.

## 2017-03-13 NOTE — Telephone Encounter (Signed)
Patient informed on lab result.   Patient verified DOB.  

## 2017-03-13 NOTE — Telephone Encounter (Signed)
-----   Message from Mandesia R Hairston, FNP sent at 03/13/2017  1:38 PM EST ----- HIV, Hepatitis B, and syphilis are all negative. Gonorrhea, Chlamydia, BV, Yeast and Trichomonas were all negative.    

## 2017-03-21 ENCOUNTER — Ambulatory Visit: Payer: Self-pay

## 2017-07-02 ENCOUNTER — Ambulatory Visit: Payer: Self-pay | Attending: Family Medicine | Admitting: Physician Assistant

## 2017-07-02 VITALS — BP 157/95 | HR 107 | Temp 98.8°F | Resp 18 | Ht 74.0 in | Wt 174.2 lb

## 2017-07-02 DIAGNOSIS — M545 Low back pain, unspecified: Secondary | ICD-10-CM

## 2017-07-02 DIAGNOSIS — Z760 Encounter for issue of repeat prescription: Secondary | ICD-10-CM | POA: Insufficient documentation

## 2017-07-02 DIAGNOSIS — E079 Disorder of thyroid, unspecified: Secondary | ICD-10-CM | POA: Insufficient documentation

## 2017-07-02 DIAGNOSIS — F419 Anxiety disorder, unspecified: Secondary | ICD-10-CM | POA: Insufficient documentation

## 2017-07-02 DIAGNOSIS — I10 Essential (primary) hypertension: Secondary | ICD-10-CM

## 2017-07-02 DIAGNOSIS — Z888 Allergy status to other drugs, medicaments and biological substances status: Secondary | ICD-10-CM | POA: Insufficient documentation

## 2017-07-02 DIAGNOSIS — Z79899 Other long term (current) drug therapy: Secondary | ICD-10-CM | POA: Insufficient documentation

## 2017-07-02 MED ORDER — LOSARTAN POTASSIUM 50 MG PO TABS: 50.0000 mg | ORAL_TABLET | Freq: Every day | ORAL | 3 refills | Status: DC

## 2017-07-02 MED ORDER — METHOCARBAMOL 500 MG PO TABS: 1000.0000 mg | ORAL_TABLET | Freq: Three times a day (TID) | ORAL | 0 refills | Status: DC

## 2017-07-02 MED ORDER — NAPROXEN 500 MG PO TABS: 500.0000 mg | ORAL_TABLET | Freq: Two times a day (BID) | ORAL | 1 refills | Status: DC

## 2017-07-02 MED FILL — NAPROXEN 500 MG TABLET: 500 | 30 days supply | Qty: 60 | Fill #0

## 2017-07-02 MED FILL — LOSARTAN POTASSIUM 50 MG TA: 50 | 30 days supply | Qty: 30 | Fill #0

## 2017-07-02 MED FILL — METHOCARBAMOL 500 MG TABS: 500 | 15 days supply | Qty: 90 | Fill #0

## 2017-07-02 NOTE — Progress Notes (Signed)
Back pain due to lifting yesterday   Monday he had blurry vision & headache   4 months w/out BP medication

## 2017-07-02 NOTE — Patient Instructions (Signed)

## 2017-07-02 NOTE — Progress Notes (Signed)
Patient ID: Caleb FantiJames Rini, male   DOB: 12/15/1975, 42 y.o.   MRN: [id number]   Caleb FantiJames Riggenbach, is a 42 y.o. male  [id number]  [id number]  DOB - 02/06/1976  Subjective:  Chief Complaint and HPI: Caleb Heath is a 42 y.o. male here today for BP med RF and back pain.  He has been out of BP meds for months.  He has been having some mild HA.  No CP.  +occasional blurry vision.  No weakness.  No SOB/no dizziness.  Tolerated losartan well when he was on it before.    Also, he was picking up some boxes yesterday and felt a pull in his lower back.  He has had this before.  No urinary s/sx.  No paresthesias or weakness.  No radicular s/sx.  Pain is sharp and worse with movement.  Using a tiger balm patch and OTC naprosyn with little relief.    ROS:   Constitutional:  No f/c, No night sweats, No unexplained weight loss. EENT:  No vision changes, No blurry vision, No hearing changes. No mouth, throat, or ear problems.  Respiratory: No cough, No SOB Cardiac: No CP, no palpitations GI:  No abd pain, No N/V/D. GU: No Urinary s/sx Musculoskeletal: +LBP Neuro: +some headache, no dizziness, no motor weakness.  Skin: No rash Endocrine:  No polydipsia. No polyuria.  Psych: Denies SI/HI  No problems updated.  ALLERGIES: Allergies  Allergen Reactions  . Hydrochlorothiazide   . Amlodipine Nausea Only and Other (See Comments)    drowsy    PAST MEDICAL HISTORY: Past Medical History:  Diagnosis Date  . Anxiety   . Fx wrist   . Hypertension   . Migraine   . Thyroid disease     MEDICATIONS AT HOME: Prior to Admission medications   Medication Sig Start Date End Date Taking? Authorizing Provider  hydrocortisone (ANUSOL-HC) 2.5 % rectal cream Place 1 application rectally 2 (two) times daily. Patient not taking: Reported on 07/02/2017 03/07/17   Lizbeth BarkHairston, Mandesia R, FNP  losartan (COZAAR) 50 MG tablet Take 1 tablet (50 mg total) by mouth daily. 07/02/17   Anders SimmondsMcClung, Angela M, PA-C    methocarbamol (ROBAXIN) 500 MG tablet Take 2 tablets (1,000 mg total) by mouth 3 (three) times daily. X 7 days then prn pain or muscle spasm 07/02/17   Georgian CoMcClung, Angela M, PA-C  naproxen (NAPROSYN) 500 MG tablet Take 1 tablet (500 mg total) by mouth 2 (two) times daily with a meal. X 1 week then prn pain 07/02/17   Georgian CoMcClung, Angela M, PA-C  polyethylene glycol powder (GLYCOLAX/MIRALAX) powder Take 17 g by mouth daily as needed for mild constipation or moderate constipation. 03/07/17   Lizbeth BarkHairston, Mandesia R, FNP     Objective:  EXAM:   Vitals:   07/02/17 1445  BP: (!) 157/95  Pulse: (!) 107  Resp: 18  Temp: 98.8 F (37.1 C)  TempSrc: Oral  SpO2: 96%  Weight: 174 lb 3.2 oz (79 kg)  Height: 6\' 2"  (1.88 m)    General appearance : A&OX3. NAD. Non-toxic-appearing HEENT: Atraumatic and Normocephalic.  PERRLA. EOM intact.   Neck: supple, no JVD. No cervical lymphadenopathy. No thyromegaly Chest/Lungs:  Breathing-non-labored, Good air entry bilaterally, breath sounds normal without rales, rhonchi, or wheezing  CVS: S1 S2 regular, no murmurs, gallops, rubs  Back:  ROM ~50% of normal due to pain.  +TTP in LB with +paraspinus spasms.  Neg SLRB.  DTR=B Extremities: Bilateral Lower Ext shows no edema, both legs are warm  to touch with = pulse throughout Neurology:  CN II-XII grossly intact, Non focal.   Psych:  TP linear. J/I WNL. Normal speech. Appropriate eye contact and affect.  Skin:  No Rash  Data Review Lab Results  Component Value Date   HGBA1C 5.8 (H) 08/16/2015     Assessment & Plan   1. HTN, goal below 130/80 Uncontrolled but off meds.  Resume meds.  Check BP OOO and record and bring to next visit.   - losartan (COZAAR) 50 MG tablet; Take 1 tablet (50 mg total) by mouth daily.  Dispense: 30 tablet; Refill: 3 - Basic metabolic panel  2. Acute midline low back pain without sciatica No red flags.   - naproxen (NAPROSYN) 500 MG tablet; Take 1 tablet (500 mg total) by mouth 2  (two) times daily with a meal. X 1 week then prn pain  Dispense: 60 tablet; Refill: 1 - methocarbamol (ROBAXIN) 500 MG tablet; Take 2 tablets (1,000 mg total) by mouth 3 (three) times daily. X 7 days then prn pain or muscle spasm  Dispense: 90 tablet; Refill: 0   Patient have been counseled extensively about nutrition and exercise  Return in about 6 weeks (around 08/13/2017) for assign new PCP; recheck BP.  The patient was given clear instructions to go to ER or return to medical center if symptoms don't improve, worsen or new problems develop. The patient verbalized understanding. The patient was told to call to get lab results if they haven't heard anything in the next week.     Georgian Co, PA-C [facility] [place], [place] [phone]   07/02/2017, 2:59 PM

## 2017-07-03 ENCOUNTER — Telehealth: Payer: Self-pay

## 2017-07-03 LAB — BASIC METABOLIC PANEL WITH GFR
BUN/Creatinine Ratio: 8 — ABNORMAL LOW (ref 9–20)
BUN: 7 mg/dL (ref 6–24)
CO2: 22 mmol/L (ref 20–29)
Calcium: 9.7 mg/dL (ref 8.7–10.2)
Chloride: 97 mmol/L (ref 96–106)
Creatinine, Ser: 0.88 mg/dL (ref 0.76–1.27)
GFR calc Af Amer: 124 mL/min/1.73
GFR calc non Af Amer: 107 mL/min/1.73
Glucose: 86 mg/dL (ref 65–99)
Potassium: 4.5 mmol/L (ref 3.5–5.2)
Sodium: 137 mmol/L (ref 134–144)

## 2017-07-03 NOTE — Telephone Encounter (Signed)
CMA call regarding lab results   Patient verify DOB  Patient was aware and understood  

## 2017-07-03 NOTE — Telephone Encounter (Signed)
-----   Message from Anders SimmondsAngela M McClung, [place]PA-C sent at 07/03/2017  8:28 AM EDT ----- Your blood sugar, electrolytes, and kidney function all look good.  Follow-up as planned.   Thanks, Georgian CoAngela McClung, PA-C

## 2017-07-06 ENCOUNTER — Emergency Department (HOSPITAL_COMMUNITY)
Admission: EM | Admit: 2017-07-06 | Discharge: 2017-07-06 | Disposition: A | Discharge status: Home / Self Care | Payer: Self-pay | Attending: Emergency Medicine | Admitting: Emergency Medicine

## 2017-07-06 ENCOUNTER — Emergency Department (HOSPITAL_COMMUNITY): Payer: Self-pay

## 2017-07-06 ENCOUNTER — Encounter (HOSPITAL_COMMUNITY): Payer: Self-pay | Admitting: Emergency Medicine

## 2017-07-06 DIAGNOSIS — G8929 Other chronic pain: Secondary | ICD-10-CM

## 2017-07-06 DIAGNOSIS — Z79899 Other long term (current) drug therapy: Secondary | ICD-10-CM | POA: Insufficient documentation

## 2017-07-06 DIAGNOSIS — M545 Low back pain: Secondary | ICD-10-CM | POA: Insufficient documentation

## 2017-07-06 DIAGNOSIS — I10 Essential (primary) hypertension: Secondary | ICD-10-CM | POA: Insufficient documentation

## 2017-07-06 DIAGNOSIS — F17210 Nicotine dependence, cigarettes, uncomplicated: Secondary | ICD-10-CM | POA: Insufficient documentation

## 2017-07-06 HISTORY — DX: Sciatica, unspecified side: M54.30

## 2017-07-06 MED ORDER — TRAMADOL HCL 50 MG PO TABS: 50.0000 mg | ORAL_TABLET | Freq: Four times a day (QID) | ORAL | 0 refills | Status: DC | PRN

## 2017-07-06 MED ORDER — DIAZEPAM 5 MG PO TABS
5.0000 mg | ORAL_TABLET | Freq: Two times a day (BID) | ORAL | 0 refills | Status: DC
Start: 2017-07-06 — End: 2017-08-25

## 2017-07-06 NOTE — ED Notes (Signed)
PT states understanding of care given, follow up care, and medication prescribed. PT ambulated from ED to car with a steady gait. 

## 2017-07-06 NOTE — Discharge Instructions (Addendum)
Make an appointment to follow up with your primary care provider to discuss possible physical therapy or referral to an orthopedic doctor for your pain.

## 2017-07-06 NOTE — ED Provider Notes (Signed)
[facility] EMERGENCY DEPARTMENT Provider Note   CSN: [id number] Arrival date & time: 07/06/17  1402     History   Chief Complaint Chief Complaint  Patient presents with  . Back Pain    HPI Caleb Heath is a 42 y.o. male who presents to the ED with low back pain. The pain started a week ago. Patient has been taking NSAIDS, muscle relaxer and using OTC pain patch without relief. Patient reports that in 2000 he injured his back and and has had problems a couple times since then. Patient went to PT and had f/u but never had surgery.  The history is provided by the patient. No language interpreter was used.  Back Pain   This is a new problem. The current episode started more than 2 days ago. The problem occurs constantly. The problem has been gradually worsening. The pain is associated with lifting heavy objects. The pain is present in the lumbar spine. The pain radiates to the right thigh. The pain is at a severity of 8/10. The symptoms are aggravated by bending and twisting. The pain is the same all the time. Associated symptoms include leg pain. Pertinent negatives include no chest pain, no fever, no abdominal pain, no bowel incontinence, no bladder incontinence and no dysuria. He has tried NSAIDs and muscle relaxants for the symptoms. The treatment provided no relief.    Past Medical History:  Diagnosis Date  . Anxiety   . Fx wrist   . Hypertension   . Migraine   . Sciatica   . Thyroid disease     Patient Active Problem List   Diagnosis Date Noted  . HTN (hypertension) 09/02/2012  . Muscle cramps 09/02/2012    History reviewed. No pertinent surgical history.      Home Medications    Prior to Admission medications   Medication Sig Start Date End Date Taking? Authorizing Provider  diazepam (VALIUM) 5 MG tablet Take 1 tablet (5 mg total) by mouth 2 (two) times daily. 07/06/17   Janne Napoleon, NP  losartan (COZAAR) 50 MG tablet Take 1 tablet (50 mg  total) by mouth daily. 07/02/17   Anders Simmonds, PA-C  naproxen (NAPROSYN) 500 MG tablet Take 1 tablet (500 mg total) by mouth 2 (two) times daily with a meal. X 1 week then prn pain 07/02/17   Georgian Co M, PA-C  polyethylene glycol powder (GLYCOLAX/MIRALAX) powder Take 17 g by mouth daily as needed for mild constipation or moderate constipation. 03/07/17   Lizbeth Bark, FNP  traMADol (ULTRAM) 50 MG tablet Take 1 tablet (50 mg total) by mouth every 6 (six) hours as needed. 07/06/17   Janne Napoleon, NP    Family History Family History  Problem Relation Age of Onset  . Hypertension Mother   . Cancer Father 28       colon  . Hypertension Maternal Aunt   . Heart attack Maternal Grandmother   . Diabetes Maternal Grandmother     Social History Social History   Tobacco Use  . Smoking status: Current Every Day Smoker    Packs/day: 0.50    Types: Cigarettes  . Smokeless tobacco: Never Used  . Tobacco comment: .5 ppd  Substance Use Topics  . Alcohol use: Yes    Alcohol/week: 7.2 oz    Types: 12 Cans of beer per week    Comment: weekend  . Drug use: No     Allergies   Hydrochlorothiazide and Amlodipine  Review of Systems Review of Systems  Constitutional: Negative for fever.  HENT: Negative.   Respiratory: Negative for shortness of breath.   Cardiovascular: Negative for chest pain.  Gastrointestinal: Negative for abdominal pain, bowel incontinence, nausea and vomiting.  Genitourinary: Negative for bladder incontinence and dysuria.       No loss of control of bladder or bowels.  Musculoskeletal: Positive for back pain. Negative for neck pain.  Skin: Negative for wound.  Neurological: Negative for syncope.  Psychiatric/Behavioral: Negative for confusion.     Physical Exam Updated Vital Signs BP (!) 156/102 (BP Location: Left Arm)   Pulse (!) 114   Temp 98.4 F (36.9 C) (Oral)   Resp 20   SpO2 100%   Physical Exam  Constitutional: He appears  well-developed and well-nourished. No distress.  HENT:  Head: Normocephalic and atraumatic.  Eyes: EOM are normal.  Neck: Neck supple.  Cardiovascular: Normal rate and intact distal pulses.  Pulmonary/Chest: Effort normal.  Abdominal: Soft. There is no tenderness.  Musculoskeletal: Normal range of motion.  Radial pulses 2+, grips are equal.   Neurological: He is alert. He has normal strength. No sensory deficit. Gait normal.  Reflex Scores:      Bicep reflexes are 2+ on the right side and 2+ on the left side.      Brachioradialis reflexes are 2+ on the right side and 2+ on the left side.      Patellar reflexes are 2+ on the right side and 2+ on the left side. Skin: Skin is warm and dry.  Psychiatric: He has a normal mood and affect. His behavior is normal.  Nursing note and vitals reviewed.    ED Treatments / Results  Labs (all labs ordered are listed, but only abnormal results are displayed) Labs Reviewed - No data to display  Radiology Ct Lumbar Spine Wo Contrast  Result Date: 07/06/2017 CLINICAL DATA:  Low back pain from heavy lifting. Sciatica on the right. EXAM: CT LUMBAR SPINE WITHOUT CONTRAST TECHNIQUE: Multidetector CT imaging of the lumbar spine was performed without intravenous contrast administration. Multiplanar CT image reconstructions were also generated. COMPARISON:  12/09/2016 abdominal CT FINDINGS: Segmentation: 5 lumbar type vertebral bodies. Alignment: Normal Vertebrae: No acute fracture or focal pathologic process. Paraspinal and other soft tissues: Atherosclerotic calcification, mild but notable for age. There is 22 mm calcification of the right kidney that has been noted since at least 2016. When measured in the same plane there is no detected growth since prior. Reference dedicated abdominal CT imaging 02/27/2015 and 12/09/2016. Disc levels: Mild disc narrowing and posterior bulging with buttressing osteophyte at L5-S1. This appearance is stable from 2016. No  high-grade stenosis. IMPRESSION: 1. No acute finding. 2. L5-S1 mild disc degeneration that is stable from 2016. 3. Atherosclerotic calcification that is mild but notable for age. 4. Calcified lesion in the right kidney, reference abdominal CT 02/27/2015 and 12/09/2016. Electronically Signed   By: Marnee Spring M.D.   On: 07/06/2017 18:45    Procedures Procedures (including critical care time)  Medications Ordered in ED Medications - No data to display   Initial Impression / Assessment and Plan / ED Course  I have reviewed the triage vital signs and the nursing notes. Patient with back pain.  No neurological deficits and normal neuro exam.  Patient can walk but states is painful.  No loss of bowel or bladder control.  No concern for cauda equina.  No fever, night sweats, weight loss, h/o cancer, IVDU.  RICE  protocol and pain medicine indicated and discussed with patient. CT without acute findings. Discussed with the patient f/u with PCP to discuss continued pain management or other referral.   Final Clinical Impressions(s) / ED Diagnoses   Final diagnoses:  Acute exacerbation of chronic low back pain    ED Discharge Orders        Ordered    diazepam (VALIUM) 5 MG tablet  2 times daily     07/06/17 1855    traMADol (ULTRAM) 50 MG tablet  Every 6 hours PRN     07/06/17 1855       Damian Leavelleese, [place] M, NP 07/06/17 1859    Jacalyn LefevreHaviland, Julie, MD 07/10/17 [phone]

## 2017-07-06 NOTE — ED Triage Notes (Signed)
Patient presents to ED for assessment of lower back pain after heavy lifting at work on Tuesday.  States he has been trying muscle relaxer amd ibuprofen at home without relief.  Hx of sciatica, states he is currently have pain down the back of his right leg as well.  Denies incontinence

## 2017-08-25 ENCOUNTER — Encounter: Payer: Self-pay | Admitting: *Deleted

## 2017-08-25 ENCOUNTER — Encounter: Payer: Self-pay | Admitting: Internal Medicine

## 2017-08-25 ENCOUNTER — Ambulatory Visit: Payer: PRIVATE HEALTH INSURANCE | Attending: Internal Medicine | Admitting: Internal Medicine

## 2017-08-25 VITALS — BP 136/82 | HR 106 | Temp 99.0°F | Resp 16 | Ht 74.0 in | Wt 172.0 lb

## 2017-08-25 DIAGNOSIS — F17210 Nicotine dependence, cigarettes, uncomplicated: Secondary | ICD-10-CM | POA: Diagnosis not present

## 2017-08-25 DIAGNOSIS — Z1211 Encounter for screening for malignant neoplasm of colon: Secondary | ICD-10-CM

## 2017-08-25 DIAGNOSIS — J301 Allergic rhinitis due to pollen: Secondary | ICD-10-CM

## 2017-08-25 DIAGNOSIS — Z800 Family history of malignant neoplasm of digestive organs: Secondary | ICD-10-CM | POA: Diagnosis not present

## 2017-08-25 DIAGNOSIS — I10 Essential (primary) hypertension: Secondary | ICD-10-CM

## 2017-08-25 DIAGNOSIS — R946 Abnormal results of thyroid function studies: Secondary | ICD-10-CM | POA: Diagnosis not present

## 2017-08-25 DIAGNOSIS — G43019 Migraine without aura, intractable, without status migrainosus: Secondary | ICD-10-CM

## 2017-08-25 DIAGNOSIS — Z833 Family history of diabetes mellitus: Secondary | ICD-10-CM | POA: Insufficient documentation

## 2017-08-25 DIAGNOSIS — Z79899 Other long term (current) drug therapy: Secondary | ICD-10-CM | POA: Diagnosis not present

## 2017-08-25 DIAGNOSIS — Z8249 Family history of ischemic heart disease and other diseases of the circulatory system: Secondary | ICD-10-CM | POA: Diagnosis not present

## 2017-08-25 DIAGNOSIS — J302 Other seasonal allergic rhinitis: Secondary | ICD-10-CM | POA: Insufficient documentation

## 2017-08-25 MED ORDER — CETIRIZINE HCL 10 MG PO TABS: 10.0000 mg | ORAL_TABLET | Freq: Every day | ORAL | 11 refills | Status: DC

## 2017-08-25 MED ORDER — FLUTICASONE PROPIONATE 50 MCG/ACT NA SUSP: 2.0000 | Freq: Every day | NASAL | 6 refills | Status: DC

## 2017-08-25 MED ORDER — LOSARTAN POTASSIUM 50 MG PO TABS: 50.0000 mg | ORAL_TABLET | Freq: Every day | ORAL | 3 refills | Status: DC

## 2017-08-25 MED FILL — LOSARTAN POTASSIUM 50 MG TA: 50 | 30 days supply | Qty: 30 | Fill #1

## 2017-08-25 NOTE — Patient Instructions (Signed)
Nurse only BP check in 2 weeks.  

## 2017-08-25 NOTE — Progress Notes (Signed)
Patient ID: Caleb Heath, male    DOB: 11/24/1975  MRN: [id number]  CC: re-establish and Hypertension   Subjective: Caleb Heath is a 42 y.o. male who presents for chronic ds manangement.  Previous PCP was Hairston who has left the practice. His concerns today include:   HTN:  Compliant with Cozaar.  No device to check blood pressure Area active at work where he does a lot of lifting and walking.   No CP.  SOB at times when it gets hot Use to get HA 3-4 x a mth that last 2-3 days.  Now occurs about 1 mth. + blurred vision headaches, no associated nausea or vomiting or photophobia.  Headaches are usually all over.  Trigger is stress.  Naprosyn easies it.  Also better lying down in a quiet room.  Allergies: Complains of itchy watery eyes, sinus/nasal congestion, rhinorrhea and itchy throat.  Requesting prescription for allergies.  Was  on Zyrtec and Flonase in the past.  Zyretec and flonase.  Family history of colon cancer in his father at the age of 41.  His father is now deceased.  Patient has never had a colonoscopy.  He reports blood in the stools intermittently when he has flareup of hemorrhoids.    He is requesting to have his thyroid checked.  TSH last year was slightly low but normal free T3 and free T4.  No unexplained weight loss.  No palpitations  Patient Active Problem List   Diagnosis Date Noted  . HTN (hypertension) 09/02/2012  . Muscle cramps 09/02/2012     Current Outpatient Medications on File Prior to Visit  Medication Sig Dispense Refill  . diazepam (VALIUM) 5 MG tablet Take 1 tablet (5 mg total) by mouth 2 (two) times daily. (Patient not taking: Reported on 08/25/2017) 10 tablet 0  . losartan (COZAAR) 50 MG tablet Take 1 tablet (50 mg total) by mouth daily. 30 tablet 3  . naproxen (NAPROSYN) 500 MG tablet Take 1 tablet (500 mg total) by mouth 2 (two) times daily with a meal. X 1 week then prn pain (Patient not taking: Reported on 08/25/2017) 60 tablet 1  .  polyethylene glycol powder (GLYCOLAX/MIRALAX) powder Take 17 g by mouth daily as needed for mild constipation or moderate constipation. (Patient not taking: Reported on 08/25/2017) 3350 g 0  . traMADol (ULTRAM) 50 MG tablet Take 1 tablet (50 mg total) by mouth every 6 (six) hours as needed. (Patient not taking: Reported on 08/25/2017) 6 tablet 0   No current facility-administered medications on file prior to visit.     Allergies  Allergen Reactions  . Hydrochlorothiazide   . Amlodipine Nausea Only and Other (See Comments)    drowsy    Social History   Socioeconomic History  . Marital status: Single    Spouse name: Not on file  . Number of children: Not on file  . Years of education: Not on file  . Highest education level: Not on file  Occupational History  . Not on file  Social Needs  . Financial resource strain: Not on file  . Food insecurity:    Worry: Not on file    Inability: Not on file  . Transportation needs:    Medical: Not on file    Non-medical: Not on file  Tobacco Use  . Smoking status: Current Every Day Smoker    Packs/day: 0.50    Types: Cigarettes  . Smokeless tobacco: Never Used  . Tobacco comment: .5 ppd  Substance and Sexual Activity  . Alcohol use: Yes    Alcohol/week: 7.2 oz    Types: 12 Cans of beer per week    Comment: weekend  . Drug use: No  . Sexual activity: Not on file  Lifestyle  . Physical activity:    Days per week: Not on file    Minutes per session: Not on file  . Stress: Not on file  Relationships  . Social connections:    Talks on phone: Not on file    Gets together: Not on file    Attends religious service: Not on file    Active member of club or organization: Not on file    Attends meetings of clubs or organizations: Not on file    Relationship status: Not on file  . Intimate partner violence:    Fear of current or ex partner: Not on file    Emotionally abused: Not on file    Physically abused: Not on file    Forced sexual  activity: Not on file  Other Topics Concern  . Not on file  Social History Narrative  . Not on file    Family History  Problem Relation Age of Onset  . Hypertension Mother   . Cancer Father 50       colon  . Hypertension Maternal Aunt   . Heart attack Maternal Grandmother   . Diabetes Maternal Grandmother     No past surgical history on file.  ROS: Review of Systems Negative except as stated above PHYSICAL EXAM: BP 136/82   Pulse (!) 106   Temp 99 F (37.2 C) (Oral)   Resp 16   Ht 6\' 2"  (1.88 m)   Wt 172 lb (78 kg)   SpO2 99%   BMI 22.08 kg/m   Wt Readings from Last 3 Encounters:  08/25/17 172 lb (78 kg)  07/02/17 174 lb 3.2 oz (79 kg)  03/07/17 176 lb 12.8 oz (80.2 kg)  144/92 Physical Exam  General appearance - alert, well appearing, and in no distress Mental status - normal mood, behavior, speech, dress, motor activity, and thought processes Nose -mild enlargement of nasal turbinates Mouth - mucous membranes moist, pharynx normal without lesions Neck - supple, no significant adenopathy.  No thyroid enlargement Chest - clear to auscultation, no wheezes, rales or rhonchi, symmetric air entry Heart - normal rate, regular rhythm, normal S1, S2, no murmurs, rubs, clicks or gallops Extremities - peripheral pulses normal, no pedal edema, no clubbing or cyanosis   ASSESSMENT AND PLAN: 1. Essential hypertension Not at goal.  DASH diet discussed.  Nurse only blood pressure check in 2 weeks. - losartan (COZAAR) 50 MG tablet; Take 1 tablet (50 mg total) by mouth daily.  Dispense: 30 tablet; Refill: 3  2. Seasonal allergic rhinitis due to pollen - cetirizine (ZYRTEC) 10 MG tablet; Take 1 tablet (10 mg total) by mouth daily.  Dispense: 30 tablet; Refill: 11 - fluticasone (FLONASE) 50 MCG/ACT nasal spray; Place 2 sprays into both nostrils daily.  Dispense: 16 g; Refill: 6  3. Intractable migraine without aura and without status migrainosus Recently decreased in  frequency. Recommend over-the-counter Excedrin Migraine to use as needed  4. Colon cancer screening Given history of colon cancer in a first-degree relative at the age of 61 he needs to start screening now since he is 15. - Ambulatory referral to Gastroenterology  5. Abnormal thyroid function test - TSH+T4F+T3Free  Patient was given the opportunity to ask questions.  Patient  verbalized understanding of the plan and was able to repeat key elements of the plan.   No orders of the defined types were placed in this encounter.    Requested Prescriptions    No prescriptions requested or ordered in this encounter    No follow-ups on file.  Karle Plumber, MD, FACP

## 2017-08-26 ENCOUNTER — Encounter: Payer: Self-pay | Admitting: Gastroenterology

## 2017-08-26 LAB — TSH+T4F+T3FREE
Free T4: 1.13 ng/dL (ref 0.82–1.77)
T3 FREE: 3.3 pg/mL (ref 2.0–4.4)
TSH: 1.06 u[IU]/mL (ref 0.450–4.500)

## 2017-10-13 ENCOUNTER — Encounter: Payer: Self-pay | Admitting: Gastroenterology

## 2017-10-13 ENCOUNTER — Emergency Department (HOSPITAL_COMMUNITY)
Admission: EM | Admit: 2017-10-13 | Discharge: 2017-10-13 | Disposition: A | Discharge status: Home / Self Care | Payer: PRIVATE HEALTH INSURANCE | Attending: Emergency Medicine | Admitting: Emergency Medicine

## 2017-10-13 ENCOUNTER — Encounter (HOSPITAL_COMMUNITY): Payer: Self-pay | Admitting: Emergency Medicine

## 2017-10-13 ENCOUNTER — Other Ambulatory Visit: Payer: Self-pay

## 2017-10-13 DIAGNOSIS — E079 Disorder of thyroid, unspecified: Secondary | ICD-10-CM | POA: Diagnosis not present

## 2017-10-13 DIAGNOSIS — M5442 Lumbago with sciatica, left side: Secondary | ICD-10-CM | POA: Insufficient documentation

## 2017-10-13 DIAGNOSIS — Z79899 Other long term (current) drug therapy: Secondary | ICD-10-CM | POA: Diagnosis not present

## 2017-10-13 DIAGNOSIS — F17210 Nicotine dependence, cigarettes, uncomplicated: Secondary | ICD-10-CM | POA: Diagnosis not present

## 2017-10-13 DIAGNOSIS — M79605 Pain in left leg: Secondary | ICD-10-CM | POA: Insufficient documentation

## 2017-10-13 DIAGNOSIS — M545 Low back pain: Secondary | ICD-10-CM | POA: Diagnosis present

## 2017-10-13 DIAGNOSIS — I10 Essential (primary) hypertension: Secondary | ICD-10-CM | POA: Insufficient documentation

## 2017-10-13 MED ORDER — ORPHENADRINE CITRATE ER 100 MG PO TB12: 100.0000 mg | ORAL_TABLET | Freq: Two times a day (BID) | ORAL | 0 refills | Status: AC

## 2017-10-13 MED ORDER — MELOXICAM 7.5 MG PO TABS: 7.5000 mg | ORAL_TABLET | Freq: Every day | ORAL | 0 refills | Status: AC

## 2017-10-13 MED ORDER — KETOROLAC TROMETHAMINE 60 MG/2ML IM SOLN
30.0000 mg | Freq: Once | INTRAMUSCULAR | Status: AC
  Administered 2017-10-13: 30 mg via INTRAMUSCULAR
  Filled 2017-10-13: qty 2

## 2017-10-13 NOTE — Discharge Instructions (Signed)
Home to rest, light activity today.  Arm compress to left lower back for 20 minutes at a time.

## 2017-10-13 NOTE — ED Notes (Signed)
Pt verbalized understanding of discharge instructions and denies any further questions at this time.   

## 2017-10-13 NOTE — ED Provider Notes (Signed)
[facility] Provider Note   CSN: [id number] Arrival date & time: 10/13/17  0807     History   Chief Complaint Chief Complaint  Patient presents with  . Leg Pain  . Back Pain    HPI Gardiner FantiJames Vallely is a 42 y.o. male.  42 year old male presents with complaint of left low back pain.  Patient states pain started on Monday, awoke Monday morning with the pain and continued to have sharp left lower back pain all week, through the weekend noticed pain radiating down his left leg to his left calf.  He denies falls or injuries, states that he does a lot of heavy lifting at work but denies work-related injury.  Denies history of similar pain previously, denies abdominal pain, loss of bowel or bladder control, groin numbness.  No other complaints or concerns.     Past Medical History:  Diagnosis Date  . Anxiety   . Fx wrist   . Hypertension   . Migraine   . Sciatica   . Thyroid disease     Patient Active Problem List   Diagnosis Date Noted  . Seasonal allergic rhinitis due to pollen 08/25/2017  . Abnormal thyroid function test 08/25/2017  . Intractable migraine without aura and without status migrainosus 08/25/2017  . HTN (hypertension) 09/02/2012  . Muscle cramps 09/02/2012    History reviewed. No pertinent surgical history.      Home Medications    Prior to Admission medications   Medication Sig Start Date End Date Taking? Authorizing Provider  cetirizine (ZYRTEC) 10 MG tablet Take 1 tablet (10 mg total) by mouth daily. 08/25/17   Marcine MatarJohnson, Deborah B, MD  fluticasone (FLONASE) 50 MCG/ACT nasal spray Place 2 sprays into both nostrils daily. 08/25/17   Marcine MatarJohnson, Deborah B, MD  losartan (COZAAR) 50 MG tablet Take 1 tablet (50 mg total) by mouth daily. 08/25/17   Marcine MatarJohnson, Deborah B, MD  meloxicam (MOBIC) 7.5 MG tablet Take 1 tablet (7.5 mg total) by mouth daily for 10 days. 10/13/17 10/23/17  Jeannie FendMurphy, Sheva Mcdougle A, PA-C  orphenadrine (NORFLEX) 100 MG  tablet Take 1 tablet (100 mg total) by mouth 2 (two) times daily for 10 days. 10/13/17 10/23/17  Jeannie FendMurphy, Vira Chaplin A, PA-C    Family History Family History  Problem Relation Age of Onset  . Hypertension Mother   . Cancer Father 750       colon  . Hypertension Maternal Aunt   . Heart attack Maternal Grandmother   . Diabetes Maternal Grandmother     Social History Social History   Tobacco Use  . Smoking status: Current Every Day Smoker    Packs/day: 0.50    Types: Cigarettes  . Smokeless tobacco: Never Used  . Tobacco comment: .5 ppd  Substance Use Topics  . Alcohol use: Yes    Alcohol/week: 7.2 oz    Types: 12 Cans of beer per week    Comment: weekend  . Drug use: No     Allergies   Hydrochlorothiazide and Amlodipine   Review of Systems Review of Systems  Constitutional: Negative for fever.  Gastrointestinal: Negative for abdominal pain.  Genitourinary: Negative for difficulty urinating.  Musculoskeletal: Positive for back pain. Negative for arthralgias, myalgias, neck pain and neck stiffness.  Skin: Negative for rash and wound.  Allergic/Immunologic: Negative for immunocompromised state.  Neurological: Negative for weakness and numbness.  Hematological: Does not bruise/bleed easily.  Psychiatric/Behavioral: Negative for confusion.  All other systems reviewed and are negative.  Physical Exam Updated Vital Signs BP (!) 164/100 (BP Location: Right Arm)   Pulse (!) 128   Temp 98 F (36.7 C) (Oral)   Resp 18   Ht 6\' 2"  (1.88 m)   Wt 79.4 kg (175 lb)   SpO2 98%   BMI 22.47 kg/m   Physical Exam  Constitutional: He is oriented to person, place, and time. He appears well-developed and well-nourished. No distress.  HENT:  Head: Normocephalic and atraumatic.  Cardiovascular: Intact distal pulses.  Pulmonary/Chest: Effort normal.  Abdominal: Soft. He exhibits no distension. There is no tenderness.  Musculoskeletal: He exhibits tenderness. He exhibits no deformity.        Lumbar back: He exhibits tenderness. He exhibits no bony tenderness, no swelling, no edema, no deformity, no laceration and normal pulse.       Back:  Neurological: He is alert and oriented to person, place, and time. He has normal strength. Gait abnormal.  Reflex Scores:      Patellar reflexes are 1+ on the right side and 1+ on the left side. Skin: Skin is warm and dry. No rash noted. He is not diaphoretic.  Psychiatric: He has a normal mood and affect. His behavior is normal.  Nursing note and vitals reviewed.    ED Treatments / Results  Labs (all labs ordered are listed, but only abnormal results are displayed) Labs Reviewed - No data to display  EKG None  Radiology No results found.  Procedures Procedures (including critical care time)  Medications Ordered in ED Medications  ketorolac (TORADOL) injection 30 mg (has no administration in time range)     Initial Impression / Assessment and Plan / ED Course  I have reviewed the triage vital signs and the nursing notes.  Pertinent labs & imaging results that were available during my care of the patient were reviewed by me and considered in my medical decision making (see chart for details).  Clinical Course as of Oct 13 940  Mon Oct 13, 2017  5568 42 year old male presents with complaint of left lower back pain x1 week, radiating to the left lower leg for the past weekend.  Patient denies falls or injuries, abdominal pain, changes in bowel or bladder habits, groin numbness.  States he lifts a lot at work but denies work-related injury.  On exam patient has equal leg strength and reflexes.  Patient was able to ambulate to the room without difficulty however upon standing for exam of his back he is only able to hop on his right leg holding his left leg and stiff extension.  He has tenderness in the left lower back.  Patient will be given injection of Toradol while in the ER, discharged with prescription for meloxicam and  Norflex.     [LM]    Clinical Course User Index [LM] Jeannie Fend, PA-C     Final Clinical Impressions(s) / ED Diagnoses   Final diagnoses:  Acute left-sided low back pain with left-sided sciatica    ED Discharge Orders        Ordered    meloxicam (MOBIC) 7.5 MG tablet  Daily     10/13/17 0934    orphenadrine (NORFLEX) 100 MG tablet  2 times daily     10/13/17 0934       Jeannie Fend, PA-C 10/13/17 1610    Gwyneth Sprout, MD 10/13/17 2051

## 2017-10-13 NOTE — ED Triage Notes (Signed)
Pt. Stated, Caleb Heath had back pain that goes to the left leg since last Monday.

## 2017-10-13 NOTE — ED Notes (Signed)
ED Provider at bedside. 

## 2017-10-15 ENCOUNTER — Ambulatory Visit: Payer: PRIVATE HEALTH INSURANCE | Admitting: Gastroenterology

## 2017-11-21 ENCOUNTER — Ambulatory Visit: Payer: PRIVATE HEALTH INSURANCE | Admitting: Internal Medicine

## 2017-11-25 ENCOUNTER — Ambulatory Visit: Payer: PRIVATE HEALTH INSURANCE | Admitting: Internal Medicine

## 2017-12-10 ENCOUNTER — Ambulatory Visit: Payer: PRIVATE HEALTH INSURANCE | Admitting: Gastroenterology

## 2018-01-19 ENCOUNTER — Encounter: Payer: Self-pay | Admitting: Internal Medicine

## 2018-01-19 ENCOUNTER — Ambulatory Visit (HOSPITAL_COMMUNITY)
Admission: RE | Admit: 2018-01-19 | Discharge: 2018-01-19 | Disposition: A | Discharge status: Home / Self Care | Payer: Managed Care, Other (non HMO) | Source: Ambulatory Visit | Attending: Internal Medicine | Admitting: Internal Medicine

## 2018-01-19 ENCOUNTER — Ambulatory Visit (HOSPITAL_BASED_OUTPATIENT_CLINIC_OR_DEPARTMENT_OTHER): Payer: Managed Care, Other (non HMO) | Admitting: Internal Medicine

## 2018-01-19 VITALS — BP 165/119 | HR 109 | Temp 98.6°F | Resp 16 | Wt 163.4 lb

## 2018-01-19 DIAGNOSIS — I10 Essential (primary) hypertension: Secondary | ICD-10-CM

## 2018-01-19 DIAGNOSIS — Z79899 Other long term (current) drug therapy: Secondary | ICD-10-CM

## 2018-01-19 DIAGNOSIS — R1013 Epigastric pain: Secondary | ICD-10-CM

## 2018-01-19 DIAGNOSIS — Z8249 Family history of ischemic heart disease and other diseases of the circulatory system: Secondary | ICD-10-CM | POA: Insufficient documentation

## 2018-01-19 DIAGNOSIS — F4321 Adjustment disorder with depressed mood: Secondary | ICD-10-CM

## 2018-01-19 DIAGNOSIS — F4320 Adjustment disorder, unspecified: Secondary | ICD-10-CM

## 2018-01-19 DIAGNOSIS — G43909 Migraine, unspecified, not intractable, without status migrainosus: Secondary | ICD-10-CM

## 2018-01-19 DIAGNOSIS — Z7951 Long term (current) use of inhaled steroids: Secondary | ICD-10-CM | POA: Insufficient documentation

## 2018-01-19 DIAGNOSIS — F17210 Nicotine dependence, cigarettes, uncomplicated: Secondary | ICD-10-CM | POA: Insufficient documentation

## 2018-01-19 MED ORDER — PROMETHAZINE HCL 12.5 MG PO TABS: 12.5000 mg | ORAL_TABLET | Freq: Three times a day (TID) | ORAL | 0 refills | Status: DC | PRN

## 2018-01-19 MED ORDER — OMEPRAZOLE 20 MG PO CPDR: 20.0000 mg | DELAYED_RELEASE_CAPSULE | Freq: Every day | ORAL | 1 refills | Status: DC

## 2018-01-19 MED ORDER — LOSARTAN POTASSIUM 50 MG PO TABS: 50.0000 mg | ORAL_TABLET | Freq: Every day | ORAL | 3 refills | Status: DC

## 2018-01-19 NOTE — Progress Notes (Signed)
Patient ID: Caleb Heath, male    DOB: 06-01-1975  MRN: [id number]  CC: Emesis and Abdominal Pain   Subjective: Caleb Heath is a 42 y.o. male who presents for UC. His concerns today include:  Patient with history of HTN, migraines, seasonal allergies  Pt c/o abdominal pain x 1 wk. Started after he found out his cousin was shot and killed.  He and his cousin had a very close relationship. Pain is in the lower epigastric area.   Pain intermittent, lasting 30 mins to 1 hr.  "Feels like a strain, like I pulled something maybe." Vomiting started yesterday.  1 episode yesterday and twice today.  Saw a few streaks of blood in emesis today Drinks 12 pk beer on wkends and drank a 12 pack beer yesterday No diarrhea.  Moving bowels okay.  Last BM yesterday.   HTN:  Reportedly out of Cozaar for mths due to limited finances.  No device to check BP. Denies any CP/SOB/LE edema/HA Patient Active Problem List   Diagnosis Date Noted  . Seasonal allergic rhinitis due to pollen 08/25/2017  . Abnormal thyroid function test 08/25/2017  . Intractable migraine without aura and without status migrainosus 08/25/2017  . HTN (hypertension) 09/02/2012  . Muscle cramps 09/02/2012     Current Outpatient Medications on File Prior to Visit  Medication Sig Dispense Refill  . cetirizine (ZYRTEC) 10 MG tablet Take 1 tablet (10 mg total) by mouth daily. 30 tablet 11  . fluticasone (FLONASE) 50 MCG/ACT nasal spray Place 2 sprays into both nostrils daily. 16 g 6   No current facility-administered medications on file prior to visit.     Allergies  Allergen Reactions  . Hydrochlorothiazide   . Amlodipine Nausea Only and Other (See Comments)    drowsy    Social History   Socioeconomic History  . Marital status: Single    Spouse name: Not on file  . Number of children: Not on file  . Years of education: Not on file  . Highest education level: Not on file  Occupational History  . Not on file  Social  Needs  . Financial resource strain: Not on file  . Food insecurity:    Worry: Not on file    Inability: Not on file  . Transportation needs:    Medical: Not on file    Non-medical: Not on file  Tobacco Use  . Smoking status: Current Every Day Smoker    Packs/day: 0.50    Types: Cigarettes  . Smokeless tobacco: Never Used  . Tobacco comment: .5 ppd  Substance and Sexual Activity  . Alcohol use: Yes    Alcohol/week: 12.0 standard drinks    Types: 12 Cans of beer per week    Comment: weekend  . Drug use: No  . Sexual activity: Not on file  Lifestyle  . Physical activity:    Days per week: Not on file    Minutes per session: Not on file  . Stress: Not on file  Relationships  . Social connections:    Talks on phone: Not on file    Gets together: Not on file    Attends religious service: Not on file    Active member of club or organization: Not on file    Attends meetings of clubs or organizations: Not on file    Relationship status: Not on file  . Intimate partner violence:    Fear of current or ex partner: Not on file  Emotionally abused: Not on file    Physically abused: Not on file    Forced sexual activity: Not on file  Other Topics Concern  . Not on file  Social History Narrative  . Not on file    Family History  Problem Relation Age of Onset  . Hypertension Mother   . Colon cancer Father 16  . Hypertension Maternal Aunt   . Heart attack Maternal Grandmother   . Diabetes Maternal Grandmother     No past surgical history on file.  ROS: Review of Systems Negative except as above PHYSICAL EXAM: BP (!) 165/119 (BP Location: Right Arm, Cuff Size: Normal) Comment: recheck  Pulse (!) 109   Temp 98.6 F (37 C) (Oral)   Resp 16   Wt 163 lb 6.4 oz (74.1 kg)   SpO2 99%   BMI 20.98 kg/m   Physical Exam BP 172/121, P 112 siting; BP 157/121, P 121 standing General appearance - alert, well appearing, and in no distress Mental status - normal mood, behavior,  speech, dress, motor activity, and thought processes Mouth -oral mucosa slightly dry Chest - clear to auscultation, no wheezes, rales or rhonchi, symmetric air entry Heart - normal rate, regular rhythm, normal S1, S2, no murmurs, rubs, clicks or gallops Abdomen - soft, nontender, nondistended, no masses or organomegaly   ASSESSMENT AND PLAN: 1. Epigastric pain Now with vomiting.  Differential diagnoses include pancreatitis, gastritis, PUD, gallbladder disease.  Patient orthostatic by pulse reading.  RN not here today to give IVFs.  I recommended that he push fluids orally.  Given Phenergan to use as needed for the nausea/vomiting.  If he is not able to hold anything down this afternoon for dinner, I recommend that he be seen in the emergency room. -We will check some baseline labs including lipase and chemistry. -Sent to radiology for x-ray of the abdomen. Note given to be excused from work.  Patient states that he wished to return to work tomorrow. - CBC - Comprehensive metabolic panel - Lipase - DG Abd 2 Views; Future - omeprazole (PRILOSEC) 20 MG capsule; Take 1 capsule (20 mg total) by mouth daily.  Dispense: 30 capsule; Refill: 1 - promethazine (PHENERGAN) 12.5 MG tablet; Take 1 tablet (12.5 mg total) by mouth every 8 (eight) hours as needed for nausea or vomiting.  Dispense: 20 tablet; Refill: 0  2. Essential hypertension Blood pressure elevated.  We discussed the risks associated with uncontrolled hypertension.  He will try to get prescription filled for Cozaar today - losartan (COZAAR) 50 MG tablet; Take 1 tablet (50 mg total) by mouth daily.  Dispense: 30 tablet; Refill: 3  3. Grief reaction Our LCSW was not here today.  Patient interested in doing some counseling or seeing a therapist to help him deal with the grief of losing his cousin.  I will have our LCSW touch base with him this week..    Patient was given the opportunity to ask questions.  Patient verbalized understanding  of the plan and was able to repeat key elements of the plan.   Orders Placed This Encounter  Procedures  . DG Abd 2 Views  . CBC  . Comprehensive metabolic panel  . Lipase     Requested Prescriptions   Signed Prescriptions Disp Refills  . omeprazole (PRILOSEC) 20 MG capsule 30 capsule 1    Sig: Take 1 capsule (20 mg total) by mouth daily.  . promethazine (PHENERGAN) 12.5 MG tablet 20 tablet 0    Sig: Take  1 tablet (12.5 mg total) by mouth every 8 (eight) hours as needed for nausea or vomiting.  Marland Kitchen losartan (COZAAR) 50 MG tablet 30 tablet 3    Sig: Take 1 tablet (50 mg total) by mouth daily.    Return in about 4 weeks (around 02/16/2018) for BP.  Jonah Blue, MD, FACP

## 2018-01-19 NOTE — Patient Instructions (Signed)
Please pick up the prescription for omeprazole and Phenergan and start taking today.  Try to push fluids as much as possible.  If you have still not able to keep anything down today then I recommend being seen in the emergency room for intravenous fluids and further evaluation.  Your blood pressure is elevated.  Please fill the prescription for Cozaar and resume taking.

## 2018-01-20 ENCOUNTER — Telehealth: Payer: Self-pay | Admitting: Internal Medicine

## 2018-01-20 DIAGNOSIS — R945 Abnormal results of liver function studies: Secondary | ICD-10-CM

## 2018-01-20 DIAGNOSIS — R7989 Other specified abnormal findings of blood chemistry: Secondary | ICD-10-CM

## 2018-01-20 LAB — CBC
HEMATOCRIT: 45.7 % (ref 37.5–51.0)
HEMOGLOBIN: 15.3 g/dL (ref 13.0–17.7)
MCH: 31.2 pg (ref 26.6–33.0)
MCHC: 33.5 g/dL (ref 31.5–35.7)
MCV: 93 fL (ref 79–97)
Platelets: 248 10*3/uL (ref 150–450)
RBC: 4.91 x10E6/uL (ref 4.14–5.80)
RDW: 12.8 % (ref 12.3–15.4)
WBC: 8.2 10*3/uL (ref 3.4–10.8)

## 2018-01-20 LAB — COMPREHENSIVE METABOLIC PANEL
A/G RATIO: 2.4 — AB (ref 1.2–2.2)
ALT: 65 IU/L — ABNORMAL HIGH (ref 0–44)
AST: 86 IU/L — ABNORMAL HIGH (ref 0–40)
Albumin: 5.2 g/dL (ref 3.5–5.5)
Alkaline Phosphatase: 71 IU/L (ref 39–117)
BILIRUBIN TOTAL: 0.4 mg/dL (ref 0.0–1.2)
BUN/Creatinine Ratio: 7 — ABNORMAL LOW (ref 9–20)
BUN: 5 mg/dL — ABNORMAL LOW (ref 6–24)
CHLORIDE: 97 mmol/L (ref 96–106)
CO2: 22 mmol/L (ref 20–29)
Calcium: 9.9 mg/dL (ref 8.7–10.2)
Creatinine, Ser: 0.7 mg/dL — ABNORMAL LOW (ref 0.76–1.27)
GFR calc non Af Amer: 117 mL/min/{1.73_m2} (ref >59)
GFR, EST AFRICAN AMERICAN: 136 mL/min/{1.73_m2} (ref >59)
GLOBULIN, TOTAL: 2.2 g/dL (ref 1.5–4.5)
Glucose: 66 mg/dL (ref 65–99)
POTASSIUM: 4.4 mmol/L (ref 3.5–5.2)
Sodium: 142 mmol/L (ref 134–144)
TOTAL PROTEIN: 7.4 g/dL (ref 6.0–8.5)

## 2018-01-20 LAB — LIPASE: Lipase: 31 U/L (ref 13–78)

## 2018-01-20 NOTE — Telephone Encounter (Signed)
PC placed to pt this evening to see how he is doing compared to yesterday an to give results of blood tests and x-ray results.  Left message on VM informing him of who I am and reason for call.  Will have CMA call him tomorrow.

## 2018-01-21 NOTE — Telephone Encounter (Signed)
Pt called back and per his Nurse received note left by PCP, he stated that he is feeling a lot better and agreed to have some blood work done

## 2018-01-21 NOTE — Telephone Encounter (Signed)
Contacted pt and left a detailed vm informing pt of lab and xray results. Also asked pt to come back to the office to have additonal blood work done and if he has any questions or concerns to give me a call

## 2018-01-21 NOTE — Telephone Encounter (Signed)
Will forward to pcp

## 2018-02-04 ENCOUNTER — Telehealth: Payer: Self-pay | Admitting: Licensed Clinical Social Worker

## 2018-02-04 NOTE — Telephone Encounter (Signed)
LCSWA attempted to contact pt to follow up on behavioral health screens from prior appointment. LCSWA left message for a return call.  

## 2018-02-05 ENCOUNTER — Telehealth: Payer: Self-pay | Admitting: Licensed Clinical Social Worker

## 2018-02-05 NOTE — Telephone Encounter (Signed)
Call placed to patient. LCSWA introduced self and [facility]. Pt was informed of a consult from PCP for grief counseling. Pt shared that he is still interested in services and has private insurance.   LCSWA educated pt on how he can search for appropriate services that accepts his insurance. Pt was also informed of free grief counseling through [facility]. Pt was thankful for the information and agreed to have LCSWA mail out resources discussed at address on file.   LCSWA will attempt to follow up with patient at his next scheduled appointment with CPP on 02/16/18

## 2018-02-16 ENCOUNTER — Encounter: Payer: Managed Care, Other (non HMO) | Admitting: Pharmacist

## 2018-04-01 ENCOUNTER — Encounter (HOSPITAL_COMMUNITY): Payer: Self-pay

## 2018-04-01 ENCOUNTER — Other Ambulatory Visit: Payer: Self-pay

## 2018-04-01 ENCOUNTER — Ambulatory Visit (HOSPITAL_COMMUNITY)
Admission: EM | Admit: 2018-04-01 | Discharge: 2018-04-01 | Disposition: A | Discharge status: Home / Self Care | Payer: Managed Care, Other (non HMO) | Attending: Family Medicine | Admitting: Family Medicine

## 2018-04-01 DIAGNOSIS — M5432 Sciatica, left side: Secondary | ICD-10-CM | POA: Insufficient documentation

## 2018-04-01 MED ORDER — HYDROCODONE-ACETAMINOPHEN 5-325 MG PO TABS: 1.0000 | ORAL_TABLET | Freq: Every evening | ORAL | 0 refills | Status: DC | PRN

## 2018-04-01 MED ORDER — PREDNISONE 10 MG (21) PO TBPK: ORAL_TABLET | Freq: Every day | ORAL | 0 refills | Status: DC

## 2018-04-01 NOTE — ED Triage Notes (Signed)
Pt cc sciatic pain. X 3 weeks now. Pt radiating down his left leg.

## 2018-04-01 NOTE — ED Provider Notes (Signed)
[facility]   [id number] 04/01/18 Arrival Time: 1425  ASSESSMENT & PLAN:  1. Sciatica of left side    Recurrent problem. Normal neurological exam of lower extremities. No indication for imaging of back at this time. Discussed.  Meds ordered this encounter  Medications  . HYDROcodone-acetaminophen (NORCO/VICODIN) 5-325 MG tablet    Sig: Take 1 tablet by mouth at bedtime as needed for severe pain.    Dispense:  4 tablet    Refill:  0  . predniSONE (STERAPRED UNI-PAK 21 TAB) 10 MG (21) TBPK tablet    Sig: Take by mouth daily. Take as directed.    Dispense:  21 tablet    Refill:  0   Medication sedation precautions given. Encourage ROM/movement as tolerated. Work note given.  Follow-up Information    Marcine Matar, MD.   Specialty:  Internal Medicine Why:  As needed. Contact information: [address] [place] [address] [phone]        Tarry Kos, MD.   Specialty:  Orthopedic Surgery Why:  If you would like to see an orthopaedist for recurrent sciatic symptoms. Contact information: [address] [place] [address] [phone]           Reviewed expectations re: course of current medical issues. Questions answered. Outlined signs and symptoms indicating need for more acute intervention. Patient verbalized understanding. After Visit Summary given.   SUBJECTIVE: History from: patient.  Caleb Heath is a 43 y.o. male who presents with complaint of intermittent left sided lower buttock discomfort that radiates to down his left leg. Onset gradual, over the past 2-3 weeks. Injury/trama: none identified. History of back problems: the same pain as he is currently experiencing; occasional flare-up 2-3 times a year. Discomfort described as sharp when present. Certain movements exacerbate the described discomfort. Better with rest or certain positions when supine. Extremity sensation changes or weakness: none. Ambulatory  without difficulty. Normal bowel/bladder habits: yes. No associated abdominal pain/n/v. Self treatment: has had no relief from OTC pain medications (Tylenol).  Reports no fevers, IV drug use, or recent back surgeries or procedures.  ROS: As per HPI. All other systems negative.    OBJECTIVE:  Vitals:   04/01/18 1516 04/01/18 1517 04/01/18 1519  BP:  (!) 154/89   Pulse:  (!) 108   Resp:  18   Temp: 99.6 F (37.6 C)    TempSrc: Oral Oral   SpO2:  100%   Weight:   79.4 kg    General appearance: alert; no distress but appears uncomfortable Neck: supple with FROM; without midline tenderness CV: slight tachycardia; regular Lungs: unlabored respirations; symmetrical air entry Abdomen: soft, non-tender; non-distended Back: moderate and poorly localized left sided tenderness of his L buttock; FROM at waist; bruising: none; without midline lumbar tenderness; SLR + on L with pain down posterior leg Extremities: no edema; symmetrical with no gross deformities; normal ROM of bilateral lower extremities Skin: warm and dry Neurologic: normal gait; normal reflexes of RLE and LLE; normal sensation of RLE and LLE; normal strength of RLE and LLE Psychological: alert and cooperative; normal mood and affect   Allergies  Allergen Reactions  . Hydrochlorothiazide   . Amlodipine Nausea Only and Other (See Comments)    drowsy    Past Medical History:  Diagnosis Date  . Anxiety   . Fx wrist   . Hypertension   . Migraine   . Sciatica   . Thyroid disease    Social History  Socioeconomic History  . Marital status: Single    Spouse name: Not on file  . Number of children: Not on file  . Years of education: Not on file  . Highest education level: Not on file  Occupational History  . Not on file  Social Needs  . Financial resource strain: Not on file  . Food insecurity:    Worry: Not on file    Inability: Not on file  . Transportation needs:    Medical: Not on file    Non-medical:  Not on file  Tobacco Use  . Smoking status: Current Every Day Smoker    Packs/day: 0.50    Types: Cigarettes  . Smokeless tobacco: Never Used  . Tobacco comment: .5 ppd  Substance and Sexual Activity  . Alcohol use: Yes    Alcohol/week: 12.0 standard drinks    Types: 12 Cans of beer per week    Comment: weekend  . Drug use: No  . Sexual activity: Not on file  Lifestyle  . Physical activity:    Days per week: Not on file    Minutes per session: Not on file  . Stress: Not on file  Relationships  . Social connections:    Talks on phone: Not on file    Gets together: Not on file    Attends religious service: Not on file    Active member of club or organization: Not on file    Attends meetings of clubs or organizations: Not on file    Relationship status: Not on file  . Intimate partner violence:    Fear of current or ex partner: Not on file    Emotionally abused: Not on file    Physically abused: Not on file    Forced sexual activity: Not on file  Other Topics Concern  . Not on file  Social History Narrative  . Not on file   Family History  Problem Relation Age of Onset  . Hypertension Mother   . Colon cancer Father [id number]  . Hypertension Maternal Aunt   . Heart attack Maternal Grandmother   . Diabetes Maternal Grandmother    History reviewed. No pertinent surgical history.   Mardella LaymanHagler, Esdras Delair, MD 04/02/18 [phone]

## 2018-04-01 NOTE — Discharge Instructions (Signed)
Be aware, pain medications may cause drowsiness. Please do not drive, operate heavy machinery or make important decisions while on this medication, it can cloud your judgement.  

## 2018-04-10 ENCOUNTER — Encounter: Payer: Self-pay | Admitting: Family Medicine

## 2018-04-10 ENCOUNTER — Ambulatory Visit: Payer: Managed Care, Other (non HMO) | Attending: Family Medicine | Admitting: Family Medicine

## 2018-04-10 VITALS — BP 124/89 | HR 113 | Temp 99.0°F | Resp 18 | Ht 74.0 in | Wt 176.0 lb

## 2018-04-10 DIAGNOSIS — G8929 Other chronic pain: Secondary | ICD-10-CM

## 2018-04-10 DIAGNOSIS — M5136 Other intervertebral disc degeneration, lumbar region: Secondary | ICD-10-CM

## 2018-04-10 DIAGNOSIS — M5416 Radiculopathy, lumbar region: Secondary | ICD-10-CM | POA: Diagnosis not present

## 2018-04-10 DIAGNOSIS — M51369 Other intervertebral disc degeneration, lumbar region without mention of lumbar back pain or lower extremity pain: Secondary | ICD-10-CM

## 2018-04-10 DIAGNOSIS — M549 Dorsalgia, unspecified: Secondary | ICD-10-CM

## 2018-04-10 MED ORDER — GABAPENTIN 300 MG PO CAPS: 300.0000 mg | ORAL_CAPSULE | Freq: Every day | ORAL | 3 refills | Status: DC

## 2018-04-10 MED ORDER — TRAMADOL HCL 50 MG PO TABS: ORAL_TABLET | ORAL | 0 refills | Status: DC

## 2018-04-10 NOTE — Progress Notes (Signed)
Subjective:    Patient ID: Caleb Heath, male    DOB: [birth year], 42 y.o.   MRN: [id number]  HPI       43 yo male with the complaint of pain in the left lower back with radiation down the left leg for which he was seen on 04/01/2018 at the [facility]. Patient was diagnosed with sciatica and given #4 of hydrocodone/acet 5/325 mg and a prednisone pack 10 mg.  Patient reports that the pain started returning even while he was on the prednisone.      Patient reports that the pain in his left leg started about a month ago shortly after he had awakened and then try to get up to use the restroom when he felt as if he could not move.  Patient reports that the pain feels like the muscles are being ripped apart. Pain ranges from a 7-8 to greater than a 10 on a 0-10 scale. Pain is worse with movement.  Patient has had past issues with recurrent low back pain for several years.   Past Medical History:  Diagnosis Date  . Anxiety   . Fx wrist   . Hypertension   . Migraine   . Sciatica   . Thyroid disease    Family History  Problem Relation Age of Onset  . Hypertension Mother   . Colon cancer Father 76  . Hypertension Maternal Aunt   . Heart attack Maternal Grandmother   . Diabetes Maternal Grandmother    Social History   Tobacco Use  . Smoking status: Current Every Day Smoker    Packs/day: 0.50    Types: Cigarettes  . Smokeless tobacco: Never Used  . Tobacco comment: .5 ppd  Substance Use Topics  . Alcohol use: Yes    Alcohol/week: 12.0 standard drinks    Types: 12 Cans of beer per week    Comment: weekend  . Drug use: No   Allergies  Allergen Reactions  . Hydrochlorothiazide   . Amlodipine Nausea Only and Other (See Comments)    drowsy     Review of Systems  Constitutional: Positive for fatigue. Negative for chills and fever.  Respiratory: Negative for cough and shortness of breath.   Cardiovascular: Negative for chest pain and palpitations.  Gastrointestinal:  Negative for abdominal pain, constipation, diarrhea and nausea.  Endocrine: Negative for cold intolerance, heat intolerance, polydipsia, polyphagia and polyuria.  Genitourinary: Negative for dysuria and frequency.  Musculoskeletal: Positive for back pain and gait problem.  Neurological: Positive for weakness and numbness. Negative for seizures and headaches.       Objective:   Physical Exam BP 124/89 (BP Location: Left Arm, Patient Position: Sitting, Cuff Size: Normal)   Pulse (!) 113   Temp 99 F (37.2 C) (Oral)   Resp 18   Ht 6\' 2"  (1.88 m)   Wt 176 lb (79.8 kg)   SpO2 99%   BMI 22.60 kg/m Nurse's notes and vital signs reviewed General-well-nourished, well-developed tall male in no acute distress sitting on a chair in the exam room.  Patient with a left leg initially extended but occasionally shifts positions. Lungs-clear to auscultation bilaterally Cardiovascular-regular  rhythm with mild tachycardia Abdomen-soft, nontender Back-no CVA tenderness.  No reproducible back pain but patient with marked thoracolumbar paraspinous spasm and patient with mid thoracic and possible upper lumbar scoliosis.  Patient with positive left seated leg raise with radiation of pain to the left thigh      Assessment & Plan:  1. Acute left lumbar radiculopathy Patient with acute left sided lumbar radiculopathy. On review of chart, patient with CT scan done 07/06/2017 with L5-S1 with disc narrowing and posterior bulging. Prescription provided for gabapentin to help with radicular symptoms and tramadol for pain. Orthopedic referral placed - AMB referral to orthopedics - gabapentin (NEURONTIN) 300 MG capsule; Take 1 capsule (300 mg total) by mouth at bedtime. To help with leg pain  Dispense: 30 capsule; Refill: 3 - traMADol (ULTRAM) 50 MG tablet; 1-2 pills every 6 hours as needed for pain x 5 days  Dispense: 40 tablet; Refill: 0  2. Lumbar degenerative disc disease Patient with lumbar degenerative disc  disease on prior imaging. Patient should try to avoid heavy lifting and would likely benefit from physical therapy. Patient will be referred to Orthopedics in follow-up of his acute symptoms as well as chronic lumbar degenerative disc disease - AMB referral to orthopedics  3. Chronic back pain greater than 3 months duration Patient with evidence of scoliosis on exam and patient has been asked to obtain scoliosis films to see if further therapy or interventions may be needed - DG SCOLIOSIS EVAL COMPLETE SPINE 2 OR 3 VIEWS; Future  An After Visit Summary was printed and given to the patient.  Return if symptoms worsen or fail to improve, for 2 weeks if not seen by Orthopedics.

## 2018-04-22 ENCOUNTER — Ambulatory Visit (INDEPENDENT_AMBULATORY_CARE_PROVIDER_SITE_OTHER): Payer: Managed Care, Other (non HMO) | Admitting: Orthopaedic Surgery

## 2018-04-22 ENCOUNTER — Encounter (INDEPENDENT_AMBULATORY_CARE_PROVIDER_SITE_OTHER): Payer: Self-pay | Admitting: Orthopaedic Surgery

## 2018-04-22 ENCOUNTER — Ambulatory Visit (INDEPENDENT_AMBULATORY_CARE_PROVIDER_SITE_OTHER): Payer: Self-pay

## 2018-04-22 VITALS — BP 157/105 | HR 99 | Ht 74.0 in | Wt 176.0 lb

## 2018-04-22 DIAGNOSIS — M5442 Lumbago with sciatica, left side: Secondary | ICD-10-CM

## 2018-04-22 NOTE — Progress Notes (Signed)
Office Visit Note   Patient: Caleb Heath           Date of Birth: 1975/08/13           MRN: [id number] Visit Date: 04/22/2018              Requested by: Cain Saupe, MD [address] [place], [place] [address] PCP: Marcine Matar, MD   Assessment & Plan: Visit Diagnoses:  1. Acute left-sided low back pain with left-sided sciatica     Plan: Patient has some L5-S1 disc degeneration stable from 2016-2019 by CT scan.  He has had intermittent symptoms.  We will set him up for some physical therapy and recheck him in 5 wks.   Follow-Up Instructions: Return in about 5 weeks (around 05/27/2018).   Orders:  Orders Placed This Encounter  Procedures  . XR Lumbar Spine 2-3 Views   No orders of the defined types were placed in this encounter.     Procedures: No procedures performed   Clinical Data: No additional findings.   Subjective: Chief Complaint  Patient presents with  . Lower Back - Pain    HPI- 43 year old male seen at [facility] urgent care on 04/01/2018 with back pain left leg pain.  Previous CT scan on PACS lumbar 07/06/2017 is available for review.  He has been treated with prednisone, hydrocodone, tramadol, gabapentin with some improvement.  He has increased pain with walking sitting and standing.  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   Review of Systems positive for hypertension abnormal thyroid test history of migraines otherwise negative is a pertains HPI.   Objective: Vital Signs: BP (!) 157/105   Pulse 99   Ht 6\' 2"  (1.88 m)   Wt 176 lb (79.8 kg)   BMI 22.60 kg/m   Physical Exam  Ortho Exam  Specialty Comments:  No specialty comments available.  Imaging: CLINICAL DATA:  Low back pain from heavy lifting. Sciatica on the right.  EXAM: CT LUMBAR SPINE WITHOUT CONTRAST  TECHNIQUE: Multidetector CT imaging of the lumbar spine was performed without intravenous contrast  administration. Multiplanar CT image reconstructions were also generated.  COMPARISON:  12/09/2016 abdominal CT  FINDINGS: Segmentation: 5 lumbar type vertebral bodies.  Alignment: Normal  Vertebrae: No acute fracture or focal pathologic process.  Paraspinal and other soft tissues: Atherosclerotic calcification, mild but notable for age. There is 22 mm calcification of the right kidney that has been noted since at least 2016. When measured in the same plane there is no detected growth since prior. Reference dedicated abdominal CT imaging 02/27/2015 and 12/09/2016.  Disc levels:  Mild disc narrowing and posterior bulging with buttressing osteophyte at L5-S1. This appearance is stable from 2016. No high-grade stenosis.  IMPRESSION: 1. No acute finding. 2. L5-S1 mild disc degeneration that is stable from 2016. 3. Atherosclerotic calcification that is mild but notable for age. 4. Calcified lesion in the right kidney, reference abdominal CT 02/27/2015 and 12/09/2016.   Electronically Signed   By: Marnee Spring M.D.   On: 07/06/2017 18:45   PMFS History: Patient Active Problem List   Diagnosis Date Noted  . Seasonal allergic rhinitis due to pollen 08/25/2017  . Abnormal thyroid function test 08/25/2017  . Intractable migraine without aura and without status migrainosus 08/25/2017  . HTN (hypertension) 09/02/2012  . Muscle cramps 09/02/2012   Past Medical History:  Diagnosis Date  . Anxiety   .  Fx wrist   . Hypertension   . Migraine   . Sciatica   . Thyroid disease     Family History  Problem Relation Age of Onset  . Hypertension Mother   . Colon cancer Father 26  . Hypertension Maternal Aunt   . Heart attack Maternal Grandmother   . Diabetes Maternal Grandmother     History reviewed. No pertinent surgical history. Social History   Occupational History  . Not on file  Tobacco Use  . Smoking status: Current Every Day Smoker    Packs/day: 0.50      Types: Cigarettes  . Smokeless tobacco: Never Used  . Tobacco comment: .5 ppd  Substance and Sexual Activity  . Alcohol use: Yes    Alcohol/week: 12.0 standard drinks    Types: 12 Cans of beer per week    Comment: weekend  . Drug use: No  . Sexual activity: Not on file

## 2018-05-06 ENCOUNTER — Telehealth (INDEPENDENT_AMBULATORY_CARE_PROVIDER_SITE_OTHER): Payer: Self-pay | Admitting: Orthopaedic Surgery

## 2018-05-06 NOTE — Telephone Encounter (Signed)
Returned call to patient left message to call back. 

## 2018-05-27 ENCOUNTER — Ambulatory Visit (INDEPENDENT_AMBULATORY_CARE_PROVIDER_SITE_OTHER): Payer: Managed Care, Other (non HMO) | Admitting: Orthopaedic Surgery

## 2018-07-28 ENCOUNTER — Other Ambulatory Visit: Payer: Self-pay

## 2018-07-28 ENCOUNTER — Ambulatory Visit: Payer: Managed Care, Other (non HMO) | Attending: Internal Medicine | Admitting: Internal Medicine

## 2018-07-28 DIAGNOSIS — Z1211 Encounter for screening for malignant neoplasm of colon: Secondary | ICD-10-CM

## 2018-07-28 DIAGNOSIS — Z800 Family history of malignant neoplasm of digestive organs: Secondary | ICD-10-CM

## 2018-07-28 DIAGNOSIS — Z7189 Other specified counseling: Secondary | ICD-10-CM

## 2018-07-28 DIAGNOSIS — I10 Essential (primary) hypertension: Secondary | ICD-10-CM

## 2018-07-28 MED ORDER — LOSARTAN POTASSIUM 50 MG PO TABS: 50.0000 mg | ORAL_TABLET | Freq: Every day | ORAL | 3 refills | Status: DC

## 2018-07-28 NOTE — Progress Notes (Signed)
Pt states he was taking norvasc for bp but hasn't had any in a while   Pt states he hasn't had bp checked in a while   Pt states he had a slight headache the other day but it came from crying   Pt states he has been self quartine hisself for a few days now.   Pt states he was around people that had symptoms  Pt states he took some time off of work because he was around those people and he work with food    Pt states he called the COVID-19 hotline and was informed that there was no need for him to get tested

## 2018-07-28 NOTE — Progress Notes (Signed)
Virtual Visit via Telephone Note Due to current restrictions/limitations of in-office visits due to the COVID-19 pandemic, this scheduled clinical appointment was converted to a telehealth visit  I connected with Caleb FantiJames Heath on 07/28/18 at 12:08 p.m by telephone  from my office and verified that I am speaking with the correct person using two identifiers.  The patient is at home.  Only the patient and myself participated in this encounter.   I discussed the limitations, risks, security and privacy concerns of performing an evaluation and management service by telephone and the availability of in person appointments. I also discussed with the patient that there may be a patient responsible charge related to this service. The patient expressed understanding and agreed to proceed.  History of Present Illness: Patient with history of HTN, migraines, seasonal allergies, fhx of colon CA in father.  Last seen by me 12/2017   HTN:  Out of Losartan since last yr He limits in foods Exercise:  Just started exercising more.  Does push ups, uses abd wheel and does a lot of walking at work.  No CP/SOB/HA No device to check BP  Had death in family and was around family members 1 wk ago because of that.  One family member had cough and HA.  He was not wearing mask. Pt concern that she may had COVID so he placed himself in self quarantine for past 3 days.  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  Spoke with his cousin yesterday and was told that she spoke with her doctor and there was no concern for COVID19 for her.  Pt denies fever, cough, SOB, sore throat, loss of smell or taste.  He is requesting a letter to be out of work.  HM:  Never got c-scope as  referred last yr.  Father dx with colon CA at age 42.   Observations/Objective:   Assessment and Plan: 1. Essential hypertension Level of control unknown.  We will restart him on Cozaar and have him follow-up with our clinical pharmacist in 1 week for blood pressure check.  At that time we will check a BMP Encourage low-salt diet.  Continue regular exercise - losartan (COZAAR) 50 MG tablet; Take 1 tablet (50 mg total) by mouth daily.  Dispense: 30 tablet; Refill: 3  2. Colon cancer screening 3. Family history of colon cancer - Ambulatory referral to Gastroenterology  4. Educated About Covid-19 Virus Infection Advised patient that sometimes people can be asymptomatic with COVID-19 infection however, based on the history that he reported to me, I do not see the need for him to be in self quarantine.  I told him that he can return to work tomorrow.  Patient states that because he had notified his HR department 3 days ago that he was doing self quarantine, they required that he be out of work for at least 7 days and asymptomatic before returning to work.  So he requested a note to return to work this Sunday which would be at 7 days.  I will write the letter for him to return to work on Sunday.  Patient advised that if he develops any symptoms between now and then he should let me know otherwise he can  return to work on Sunday as planned.   Follow Up Instructions: Patient to see clinical pharmacist in 1 week for blood pressure recheck.  Please have him go to the lab at that time for BMP.  Follow-up with me in 3 months   I discussed the assessment and treatment plan with the patient. The patient was provided an opportunity to ask questions and all were answered. The patient agreed with the plan and demonstrated an understanding of the instructions.   The patient was advised to call back or seek an in-person evaluation if the symptoms worsen or if the condition fails to improve as anticipated.  I  provided 18 minutes of non-face-to-face time during this encounter.   Jonah Blue, MD

## 2018-07-30 ENCOUNTER — Telehealth: Payer: Self-pay | Admitting: Internal Medicine

## 2018-07-30 NOTE — Telephone Encounter (Signed)
Contacted pt and made aware that letter is ready for pickup. Pt will be by tomorrow to pick it up

## 2018-07-30 NOTE — Telephone Encounter (Signed)
Patient called requesting a note stating that he has no symptoms of COVID 19, please follow up.

## 2018-07-31 ENCOUNTER — Encounter: Payer: Self-pay | Admitting: Emergency Medicine

## 2018-07-31 NOTE — Progress Notes (Signed)
Patient was rewritten his work note

## 2018-07-31 NOTE — Telephone Encounter (Signed)
Patient called returned.  Patient did not answer.  Return number left.

## 2018-07-31 NOTE — Telephone Encounter (Signed)
Patient called in regards to the letter he received. Patient states he needs the letter to say he has not be symptomatic within the last 7 days for his employer. Please follow up.

## 2018-08-04 ENCOUNTER — Ambulatory Visit: Payer: Managed Care, Other (non HMO) | Attending: Family Medicine | Admitting: Pharmacist

## 2018-08-04 ENCOUNTER — Other Ambulatory Visit: Payer: Self-pay

## 2018-08-04 ENCOUNTER — Encounter: Payer: Self-pay | Admitting: Pharmacist

## 2018-08-04 ENCOUNTER — Encounter: Payer: Self-pay | Admitting: Internal Medicine

## 2018-08-04 DIAGNOSIS — I10 Essential (primary) hypertension: Secondary | ICD-10-CM

## 2018-08-04 NOTE — Progress Notes (Signed)
   S:    PCP: Dr. Laural Benes  Patient arrives in good spirits. Presents to the clinic for hypertension management. Patient was referred by Dr. Laural Benes on 07/28/18. That visit occurred via telephone and the patient reported being without losartan x1 yr. Losartan was restarted.   Patient denies adherence with medications. Has not picked up from pharmacy - "I have a lot going on right now" but plans to pick up today.   No HA or blurred vision. Denies chest pains or shortness of breath. No lower leg or ankle swelling.   Current BP Medications include:  Losartan 50 mg daily  Dietary habits include:  - limits salt - limits caffeine Exercise habits include: - lifts/push-ups ~1 hour daily - walks a lot at work Family / Social history:  - FHx: HTN (mother), MI (MGM), DM (MGM) - Current every day smoker (0.5 PPD) - Drinks alcohol occasionally  Home BP readings:  - no cuff at home  O:  L arm after 5 minutes rest: 161/88, HR 118  Last 3 Office BP readings: BP Readings from Last 3 Encounters:  08/04/18 (!) 161/88  04/22/18 (!) 157/105  04/10/18 124/89   BMET    Component Value Date/Time   NA 142 01/19/2018 1650   K 4.4 01/19/2018 1650   CL 97 01/19/2018 1650   CO2 22 01/19/2018 1650   GLUCOSE 66 01/19/2018 1650   GLUCOSE 91 12/09/2016 0101   BUN 5 (L) 01/19/2018 1650   CREATININE 0.70 (L) 01/19/2018 1650   CREATININE 0.78 08/16/2015 1021   CALCIUM 9.9 01/19/2018 1650   GFRNONAA 117 01/19/2018 1650   GFRNONAA >89 08/16/2015 1021   GFRAA 136 01/19/2018 1650   GFRAA >89 08/16/2015 1021   Renal function: CrCl cannot be calculated (Patient's most recent lab result is older than the maximum 21 days allowed.).  Clinical ASCVD: No  The 10-year ASCVD risk score Denman George DC Jr., et al., 2013) is: 11.7%   Values used to calculate the score:     Age: 43 years     Sex: Male     Is Non-Hispanic African American: Yes     Diabetic: No     Tobacco smoker: Yes     Systolic Blood Pressure:  161 mmHg     Is BP treated: Yes     HDL Cholesterol: 71 mg/dL     Total Cholesterol: 149 mg/dL  A/P: Hypertension longstanding currently uncontrolled on current medications. BP Goal = <130/80 mmHg. Patient is not adherent with current medications.  -Patient to pick-up and start losartan today after appt.    -F/u labs ordered - BMP per PCP request  -Counseled on lifestyle modifications for blood pressure control including reduced dietary sodium, increased exercise, adequate sleep -ASCVD risk: primary prevention in patient with no DM. Last lipid panel in 2015. Recommend updated lipid panel.  -HM: recommend Pneumovax with smoking history. Will address at next appt.   Results reviewed and written information provided. Total time in face-to-face counseling 15 minutes.   F/U Clinic Visit in 2 weeks.  Butch Penny, PharmD, CPP Clinical Pharmacist [facility] & [facility] [phone]

## 2018-08-04 NOTE — Patient Instructions (Signed)
Thank you for coming to see us today.   Blood pressure today is elevated.  Start taking blood pressure medications as prescribed.   Limiting salt and caffeine, as well as exercising as able for at least 30 minutes for 5 days out of the week, can also help you lower your blood pressure.  Take your blood pressure at home if you are able. Please write down these numbers and bring them to your visits.  If you have any questions about medications, please call me [phone].  Luke.   

## 2018-08-04 NOTE — Telephone Encounter (Signed)
Patient was in clinic waiting for their letter stated he would like his letter sent through [facility] and that he would like someone to FU in order to provide date clarification. Please follow up.

## 2018-08-05 LAB — BASIC METABOLIC PANEL
BUN/Creatinine Ratio: 12 (ref 9–20)
BUN: 11 mg/dL (ref 6–24)
CO2: 23 mmol/L (ref 20–29)
Calcium: 10 mg/dL (ref 8.7–10.2)
Chloride: 100 mmol/L (ref 96–106)
Creatinine, Ser: 0.89 mg/dL (ref 0.76–1.27)
GFR calc Af Amer: 123 mL/min/{1.73_m2} (ref >59)
GFR calc non Af Amer: 106 mL/min/{1.73_m2} (ref >59)
Glucose: 151 mg/dL — ABNORMAL HIGH (ref 65–99)
Potassium: 4.3 mmol/L (ref 3.5–5.2)
Sodium: 138 mmol/L (ref 134–144)

## 2018-08-06 NOTE — Telephone Encounter (Signed)
Already discussed this with pt

## 2018-08-18 ENCOUNTER — Encounter: Payer: Self-pay | Admitting: Pharmacist

## 2018-08-18 ENCOUNTER — Encounter: Payer: Self-pay | Admitting: Internal Medicine

## 2018-08-18 ENCOUNTER — Other Ambulatory Visit: Payer: Self-pay

## 2018-08-18 ENCOUNTER — Ambulatory Visit: Payer: Managed Care, Other (non HMO) | Attending: Family Medicine | Admitting: Pharmacist

## 2018-08-18 VITALS — BP 168/105 | HR 111

## 2018-08-18 DIAGNOSIS — I10 Essential (primary) hypertension: Secondary | ICD-10-CM | POA: Diagnosis not present

## 2018-08-18 MED FILL — LOSARTAN POTASSIUM 50 MG TA: 50 | 30 days supply | Qty: 30 | Fill #0

## 2018-08-18 NOTE — Patient Instructions (Signed)
Thank you for coming to see [place] today.   Blood pressure today is elevated which increases your risk of heart disease, stroke, and erectile dysfunction.  Start taking losartan daily in the evening before bed. Try to take every day at the same time every day.   Limiting salt and caffeine, as well as exercising as able for at least 30 minutes for 5 days out of the week, can also help you lower your blood pressure.  Take your blood pressure at home if you are able. Please write down these numbers and bring them to your visits.  If you have any questions about medications, please call me [phone].  Franky Macho

## 2018-08-18 NOTE — Progress Notes (Signed)
   S:    PCP: Dr. Laural Benes  Patient arrives in good spirits. Presents to the clinic for hypertension management. Patient was referred by Dr. Laural Benes on 07/28/18. I saw him 08/07/18 and pt reported not starting his losartan.  Today, patient continues to deny adherence with medications. Has not picked up from pharmacy - Reports "I'm trying to not take medications".   No HA or blurred vision. Denies chest pains or shortness of breath. No lower leg or ankle swelling.   Current BP Medications include:  Losartan 50 mg daily  Dietary habits include:  - limits salt - limits caffeine Exercise habits include: - lifts/push-ups ~1 hour daily - walks a lot at work Family / Social history:  - FHx: HTN (mother), MI (MGM), DM (MGM) - Current every day smoker (0.5 PPD) - Drinks alcohol occasionally  Home BP readings:  - no cuff at home  O:  L arm after 5 minutes rest: 168/105, HR 111  Last 3 Office BP readings: BP Readings from Last 3 Encounters:  08/18/18 (!) 168/105  08/04/18 (!) 161/88  04/22/18 (!) 157/105   BMET    Component Value Date/Time   NA 138 08/04/2018 1000   K 4.3 08/04/2018 1000   CL 100 08/04/2018 1000   CO2 23 08/04/2018 1000   GLUCOSE 151 (H) 08/04/2018 1000   GLUCOSE 91 12/09/2016 0101   BUN 11 08/04/2018 1000   CREATININE 0.89 08/04/2018 1000   CREATININE 0.78 08/16/2015 1021   CALCIUM 10.0 08/04/2018 1000   GFRNONAA 106 08/04/2018 1000   GFRNONAA >89 08/16/2015 1021   GFRAA 123 08/04/2018 1000   GFRAA >89 08/16/2015 1021   Renal function: CrCl cannot be calculated (Unknown ideal weight.).  Clinical ASCVD: No  The 10-year ASCVD risk score Denman George DC Jr., et al., 2013) is: 12.6%   Values used to calculate the score:     Age: 43 years     Sex: Male     Is Non-Hispanic African American: Yes     Diabetic: No     Tobacco smoker: Yes     Systolic Blood Pressure: 168 mmHg     Is BP treated: Yes     HDL Cholesterol: 71 mg/dL     Total Cholesterol: 149 mg/dL   A/P: Hypertension longstanding currently uncontrolled on current medications. BP Goal = <130/80 mmHg. Patient is not adherent with current medications. Discussed and emphasized long-term complications of uncontrolled high blood pressure including cardiovascular disease, stroke, nephropathy, and cardiomyopathy. Pt states that we will begin losartan today.  -Patient to pick-up and start losartan today after appt.    --Lipid -Counseled on lifestyle modifications for blood pressure control including reduced dietary sodium, increased exercise, adequate sleep -ASCVD risk: primary prevention in patient with no DM. Lipid panel ordered today.  -HM: recommend Pneumovax with smoking history. Counseling provided; pt declined   Results reviewed and written information provided. Total time in face-to-face counseling 15 minutes.   F/U in 1 month.   Butch Penny, PharmD, CPP Clinical Pharmacist [facility] & [facility] [phone]

## 2018-08-19 ENCOUNTER — Other Ambulatory Visit: Payer: Self-pay | Admitting: Internal Medicine

## 2018-08-19 DIAGNOSIS — R7989 Other specified abnormal findings of blood chemistry: Secondary | ICD-10-CM

## 2018-08-19 DIAGNOSIS — R945 Abnormal results of liver function studies: Secondary | ICD-10-CM

## 2018-08-19 LAB — LIPID PANEL
Chol/HDL Ratio: 2.1 ratio (ref 0.0–5.0)
Cholesterol, Total: 203 mg/dL — ABNORMAL HIGH (ref 100–199)
HDL: 96 mg/dL (ref >39)
LDL Calculated: 93 mg/dL (ref 0–99)
Triglycerides: 72 mg/dL (ref 0–149)
VLDL Cholesterol Cal: 14 mg/dL (ref 5–40)

## 2018-08-21 LAB — HEPATIC FUNCTION PANEL
ALT: 28 IU/L (ref 0–44)
AST: 44 IU/L — ABNORMAL HIGH (ref 0–40)
Albumin: 5.1 g/dL — ABNORMAL HIGH (ref 4.0–5.0)
Alkaline Phosphatase: 67 IU/L (ref 39–117)
Bilirubin Total: 0.4 mg/dL (ref 0.0–1.2)
Bilirubin, Direct: 0.12 mg/dL (ref 0.00–0.40)
Total Protein: 7 g/dL (ref 6.0–8.5)

## 2018-08-21 LAB — SPECIMEN STATUS REPORT

## 2018-09-18 ENCOUNTER — Ambulatory Visit: Payer: Managed Care, Other (non HMO) | Admitting: Pharmacist

## 2018-09-18 NOTE — Progress Notes (Deleted)
   S:    PCP: Dr. Wynetta Emery  Patient arrives in good spirits. Presents to the clinic for hypertension management. Patient was referred by Dr. Wynetta Emery on 07/28/18. I saw him 08/18/18; BP was elevated and pt reported continued non-compliance to losartan. I had a lengthy discussion with him concerning cardiovascular risk and uncontrolled HTN. Pt was amenable to trying losartan; per review of pharmacy records, he did pick-up the losartan for a month supply on 5/26.   Today, patient reports *** to medications.   No HA or blurred vision. Denies chest pains or shortness of breath. No lower leg or ankle swelling.   Current BP Medications include:  Losartan 50 mg daily  Dietary habits include:  - limits salt - limits caffeine Exercise habits include: - lifts/push-ups ~1 hour daily - walks a lot at work Family / Social history:  - FHx: HTN (mother), MI (MGM), DM (MGM) - Current every day smoker (0.5 PPD) - Drinks alcohol occasionally  Home BP readings:  - no cuff at home  O:  L arm after 5 minutes rest: 168/105, HR 111  Last 3 Office BP readings: BP Readings from Last 3 Encounters:  08/18/18 (!) 168/105  08/04/18 (!) 161/88  04/22/18 (!) 157/105   BMET    Component Value Date/Time   NA 138 08/04/2018 1000   K 4.3 08/04/2018 1000   CL 100 08/04/2018 1000   CO2 23 08/04/2018 1000   GLUCOSE 151 (H) 08/04/2018 1000   GLUCOSE 91 12/09/2016 0101   BUN 11 08/04/2018 1000   CREATININE 0.89 08/04/2018 1000   CREATININE 0.78 08/16/2015 1021   CALCIUM 10.0 08/04/2018 1000   GFRNONAA 106 08/04/2018 1000   GFRNONAA >89 08/16/2015 1021   GFRAA 123 08/04/2018 1000   GFRAA >89 08/16/2015 1021   Renal function: CrCl cannot be calculated (Patient's most recent lab result is older than the maximum 21 days allowed.).  Clinical ASCVD: No  The 10-year ASCVD risk score Mikey Bussing DC Jr., et al., 2013) is: 12.6%   Values used to calculate the score:     Age: 49 years     Sex: Male     Is  Non-Hispanic African American: Yes     Diabetic: No     Tobacco smoker: Yes     Systolic Blood Pressure: 329 mmHg     Is BP treated: Yes     HDL Cholesterol: 96 mg/dL     Total Cholesterol: 203 mg/dL  A/P: Hypertension longstanding currently uncontrolled on current medications. BP Goal = <130/80 mmHg. Patient ***adherent with current medications.   -Counseled on lifestyle modifications for blood pressure control including reduced dietary sodium, increased exercise, adequate sleep -ASCVD risk: primary prevention in patient with no DM. Las LDL < 100 but patient's risk score is 12.6%. May benefit from moderate intensity statin. Will defer to PCP for decision to start.  -HM: recommend Pneumovax with smoking history. Counseling provided; pt declined   Results reviewed and written information provided. Total time in face-to-face counseling 15 minutes.   F/U in 1 month.   Benard Halsted, PharmD, [place] [phone]

## 2018-10-30 ENCOUNTER — Other Ambulatory Visit: Payer: Self-pay

## 2018-10-30 ENCOUNTER — Ambulatory Visit: Payer: Managed Care, Other (non HMO) | Attending: Internal Medicine | Admitting: Internal Medicine

## 2018-10-30 ENCOUNTER — Encounter: Payer: Self-pay | Admitting: Internal Medicine

## 2018-10-30 VITALS — BP 154/110 | HR 99 | Temp 98.6°F | Resp 16 | Wt 168.2 lb

## 2018-10-30 DIAGNOSIS — R7989 Other specified abnormal findings of blood chemistry: Secondary | ICD-10-CM

## 2018-10-30 DIAGNOSIS — F17200 Nicotine dependence, unspecified, uncomplicated: Secondary | ICD-10-CM | POA: Insufficient documentation

## 2018-10-30 DIAGNOSIS — Z800 Family history of malignant neoplasm of digestive organs: Secondary | ICD-10-CM | POA: Insufficient documentation

## 2018-10-30 DIAGNOSIS — Z2821 Immunization not carried out because of patient refusal: Secondary | ICD-10-CM | POA: Insufficient documentation

## 2018-10-30 DIAGNOSIS — I10 Essential (primary) hypertension: Secondary | ICD-10-CM | POA: Diagnosis not present

## 2018-10-30 DIAGNOSIS — Z131 Encounter for screening for diabetes mellitus: Secondary | ICD-10-CM | POA: Diagnosis not present

## 2018-10-30 DIAGNOSIS — F17210 Nicotine dependence, cigarettes, uncomplicated: Secondary | ICD-10-CM

## 2018-10-30 DIAGNOSIS — Z9114 Patient's other noncompliance with medication regimen: Secondary | ICD-10-CM | POA: Insufficient documentation

## 2018-10-30 DIAGNOSIS — R739 Hyperglycemia, unspecified: Secondary | ICD-10-CM | POA: Diagnosis not present

## 2018-10-30 LAB — POCT GLYCOSYLATED HEMOGLOBIN (HGB A1C): HbA1c POC (<> result, manual entry): 5.4 % (ref 4.0–5.6)

## 2018-10-30 LAB — GLUCOSE, POCT (MANUAL RESULT ENTRY): POC Glucose: 101 mg/dl — AB (ref 70–99)

## 2018-10-30 MED ORDER — NICOTINE POLACRILEX 2 MG MT GUM: 2.0000 mg | CHEWING_GUM | OROMUCOSAL | 1 refills | Status: DC | PRN

## 2018-10-30 NOTE — Progress Notes (Signed)
Patient ID: Caleb FantiJames Giarrusso, male    DOB: 02/01/1976  MRN: [id number]  CC: Hypertension   Subjective: Caleb Heath is a 43 y.o. male w ho presents for chronic ds management His concerns today include:  Patient with history of HTN,migraines, seasonal allergies, fhx of colon CA in father.    HYPERTENSION Currently taking: see medication list Med Adherence: []  Yes    [x]  No  "I take it if I feel I need too.  I don't get the HA and dizziness any more."  Patient had seen the clinical pharmacist twice since last visit with me and both times he was not compliant with taking the medication Medication side effects: []  Yes    [x]  No Adherence with salt restriction: [x]  Yes    []  No Home Monitoring?: []  Yes    [x]  No Monitoring Frequency: []  Yes    []  No Home BP results range: []  Yes    []  No SOB? []  Yes    [x]  No Chest Pain?: []  Yes    [x]  No Leg swelling?: []  Yes    [x]  No Headaches?: []  Yes    [x]  No Dizziness? []  Yes    [x]  No Comments:   FHX colon CA:  Had video visit Dr. Matthias HughsBuccini.  Colonoscopy was supposed to be scheduled.  Patient states that his medical assistant was supposed to call him back with a date and time for the colonoscopy but he has not heard back from them.  Tob Dep:  1/2 pk a day.  When asked if he is ready to quit, patient stated "I want to."  Never quit in past.    Patient noted to have elevated AST and ALT in the past.  However recent repeat LFTs showed that they have just about normalized.  Patient Active Problem List   Diagnosis Date Noted  . 23-polyvalent pneumococcal polysaccharide vaccine declined 10/30/2018  . Tobacco dependence 10/30/2018  . Noncompliance with medications 10/30/2018  . Family history of colon cancer 10/30/2018  . Seasonal allergic rhinitis due to pollen 08/25/2017  . Abnormal thyroid function test 08/25/2017  . Intractable migraine without aura and without status migrainosus 08/25/2017  . HTN (hypertension) 09/02/2012  . Muscle cramps  09/02/2012     Current Outpatient Medications on File Prior to Visit  Medication Sig Dispense Refill  . cetirizine (ZYRTEC) 10 MG tablet Take 1 tablet (10 mg total) by mouth daily. 30 tablet 11  . fluticasone (FLONASE) 50 MCG/ACT nasal spray Place 2 sprays into both nostrils daily. 16 g 6  . losartan (COZAAR) 50 MG tablet Take 1 tablet (50 mg total) by mouth daily. 30 tablet 3   No current facility-administered medications on file prior to visit.     Allergies  Allergen Reactions  . Hydrochlorothiazide   . Amlodipine Nausea Only and Other (See Comments)    drowsy    Social History   Socioeconomic History  . Marital status: Single    Spouse name: Not on file  . Number of children: Not on file  . Years of education: Not on file  . Highest education level: Not on file  Occupational History  . Not on file  Social Needs  . Financial resource strain: Not on file  . Food insecurity    Worry: Not on file    Inability: Not on file  . Transportation needs    Medical: Not on file    Non-medical: Not on file  Tobacco Use  . Smoking status:  Current Every Day Smoker    Packs/day: 0.50    Types: Cigarettes  . Smokeless tobacco: Never Used  . Tobacco comment: .5 ppd  Substance and Sexual Activity  . Alcohol use: Yes    Alcohol/week: 12.0 standard drinks    Types: 12 Cans of beer per week    Comment: weekend  . Drug use: No  . Sexual activity: Not on file  Lifestyle  . Physical activity    Days per week: Not on file    Minutes per session: Not on file  . Stress: Not on file  Relationships  . Social Musicianconnections    Talks on phone: Not on file    Gets together: Not on file    Attends religious service: Not on file    Active member of club or organization: Not on file    Attends meetings of clubs or organizations: Not on file    Relationship status: Not on file  . Intimate partner violence    Fear of current or ex partner: Not on file    Emotionally abused: Not on file     Physically abused: Not on file    Forced sexual activity: Not on file  Other Topics Concern  . Not on file  Social History Narrative  . Not on file    Family History  Problem Relation Age of Onset  . Hypertension Mother   . Colon cancer Father 8550  . Hypertension Maternal Aunt   . Heart attack Maternal Grandmother   . Diabetes Maternal Grandmother     No past surgical history on file.  ROS: Review of Systems Negative except as stated above  PHYSICAL EXAM: BP (!) 154/110   Pulse 99   Temp 98.6 F (37 C) (Oral)   Resp 16   Wt 168 lb 3.2 oz (76.3 kg)   SpO2 99%   BMI 21.60 kg/m   Wt Readings from Last 3 Encounters:  10/30/18 168 lb 3.2 oz (76.3 kg)  04/22/18 176 lb (79.8 kg)  04/10/18 176 lb (79.8 kg)   Physical Exam  General appearance - alert, well appearing, and in no distress Mental status - normal mood, behavior, speech, dress, motor activity, and thought processes Eyes - pupils equal and reactive, extraocular eye movements intact Nose - normal and patent, no erythema, discharge or polyps Mouth - mucous membranes moist, pharynx normal without lesions Neck - supple, no significant adenopathy Chest - clear to auscultation, no wheezes, rales or rhonchi, symmetric air entry Heart - normal rate, regular rhythm, normal S1, S2, no murmurs, rubs, clicks or gallops Extremities - peripheral pulses normal, no pedal edema, no clubbing or cyanosis  CMP Latest Ref Rng & Units 08/18/2018 08/04/2018 01/19/2018  Glucose 65 - 99 mg/dL - 960(A151(H) 66  BUN 6 - 24 mg/dL - 11 5(L)  Creatinine 5.400.76 - 1.27 mg/dL - 9.810.89 1.91(Y0.70(L)  Sodium 134 - 144 mmol/L - 138 142  Potassium 3.5 - 5.2 mmol/L - 4.3 4.4  Chloride 96 - 106 mmol/L - 100 97  CO2 20 - 29 mmol/L - 23 22  Calcium 8.7 - 10.2 mg/dL - 78.210.0 9.9  Total Protein 6.0 - 8.5 g/dL 7.0 - 7.4  Total Bilirubin 0.0 - 1.2 mg/dL 0.4 - 0.4  Alkaline Phos 39 - 117 IU/L 67 - 71  AST 0 - 40 IU/L 44(H) - 86(H)  ALT 0 - 44 IU/L 28 - 65(H)   Lipid  Panel     Component Value Date/Time  CHOL 203 (H) 08/18/2018 0959   TRIG 72 08/18/2018 0959   HDL 96 08/18/2018 0959   CHOLHDL 2.1 08/18/2018 0959   CHOLHDL 2.4 08/16/2015 1021   VLDL 13 08/16/2015 1021   LDLCALC 93 08/18/2018 0959    CBC    Component Value Date/Time   WBC 8.2 01/19/2018 1650   WBC 7.7 12/09/2016 0101   RBC 4.91 01/19/2018 1650   RBC 5.02 12/09/2016 0101   HGB 15.3 01/19/2018 1650   HCT 45.7 01/19/2018 1650   PLT 248 01/19/2018 1650   MCV 93 01/19/2018 1650   MCH 31.2 01/19/2018 1650   MCH 32.5 12/09/2016 0101   MCHC 33.5 01/19/2018 1650   MCHC 35.7 12/09/2016 0101   RDW 12.8 01/19/2018 1650   LYMPHSABS 2,065 08/16/2015 1021   MONOABS 590 08/16/2015 1021   EOSABS 59 08/16/2015 1021   BASOSABS 0 08/16/2015 1021   Results for orders placed or performed in visit on 10/30/18  POCT glycosylated hemoglobin (Hb A1C)  Result Value Ref Range   Hemoglobin A1C     HbA1c POC (<> result, manual entry) 5.4 4.0 - 5.6 %   HbA1c, POC (prediabetic range)     HbA1c, POC (controlled diabetic range)    POCT glucose (manual entry)  Result Value Ref Range   POC Glucose 101 (A) 70 - 99 mg/dl     ASSESSMENT AND PLAN: 1. Essential hypertension Not at goal due to noncompliance with medication.  Discussed the health risks associated with uncontrolled blood pressure including cardiovascular risks, kidney damage etc.  Strongly encouraged him to take the medication.  Patient states that he will do so.  We will have him follow-up with the clinical pharmacist in 1 month for recheck  2. Noncompliance with medications See #1 above  3. Tobacco dependence Advised to quit.  Discussed medications that can help him to quit.  He wanted to try the nicotine gum.  Less than 5 minutes spent on counseling - nicotine polacrilex (NICORETTE) 2 MG gum; Take 1 each (2 mg total) by mouth as needed for smoking cessation (Chew no more than 30 pieces/day).  Dispense: 100 tablet; Refill: 1  4.  Elevated LFTs Almost normalized.  5. Hyperglycemia Blood sugar was elevated on chemistry however screen today is negative for diabetes - POCT glycosylated hemoglobin (Hb A1C) - POCT glucose (manual entry)  6. 23-polyvalent pneumococcal polysaccharide vaccine declined   7. Family history of colon cancer I have given patient the information on his discharge summary to call [facility] Dr. Osborn Coho office to schedule c-scope.  Strongly advised that he follow through on this given history of colon cancer in his father.  Patient requested note for work today stating that he was here.  Note was given.  Patient was given the opportunity to ask questions.  Patient verbalized understanding of the plan and was able to repeat key elements of the plan.   Orders Placed This Encounter  Procedures  . POCT glycosylated hemoglobin (Hb A1C)  . POCT glucose (manual entry)     Requested Prescriptions   Signed Prescriptions Disp Refills  . nicotine polacrilex (NICORETTE) 2 MG gum 100 tablet 1    Sig: Take 1 each (2 mg total) by mouth as needed for smoking cessation (Chew no more than 30 pieces/day).    Return in about 3 months (around 01/30/2019).  Karle Plumber, MD, FACP

## 2018-10-30 NOTE — Patient Instructions (Signed)
Please follow-up in 1 month with our clinical pharmacist for repeat blood pressure check.  Please call [facility] gastroenterology and request to be scheduled for your colonoscopy.  Your blood pressure is elevated both your top and bottom numbers.  Please take your blood pressure medications every day as prescribed.  Uncontrolled blood pressure can lead to heart attack and strokes.  It can also damage to kidneys over time.

## 2018-12-01 ENCOUNTER — Ambulatory Visit: Payer: Managed Care, Other (non HMO) | Admitting: Pharmacist

## 2018-12-01 NOTE — Progress Notes (Deleted)
   S:    PCP: Dr. Wynetta Emery   Patient arrives ***.    Presents to the clinic for hypertension evaluation, counseling, and management.  Patient was referred and last seen by Primary Care Provider on 10/30/2018. 154/110 at that visit. Of note, pt has hx of noncompliance.   Patient {Actions; denies-reports:120008} adherence with medications.  Current BP Medications include:  Losartan 50 mg daily   Antihypertensives tried in the past include: HCTZ (intolerance - unspecified), amlodipine (nausea, drowsiness)  Dietary habits include: *** Exercise habits include:*** Family / Social history:  - FHx: HTN (mother); DM, MI (MGM); colon cancer (father) - Tobacco: current every day smoker (0.5 PPD); currently using NRT  - Alcohol: 12 drinks/weekend   ASCVD risk factors include:***  O:  Physical Exam   ROS  Home BP readings: ***  Last 3 Office BP readings: BP Readings from Last 3 Encounters:  10/30/18 (!) 154/110  08/18/18 (!) 168/105  08/04/18 (!) 161/88    BMET    Component Value Date/Time   NA 138 08/04/2018 1000   K 4.3 08/04/2018 1000   CL 100 08/04/2018 1000   CO2 23 08/04/2018 1000   GLUCOSE 151 (H) 08/04/2018 1000   GLUCOSE 91 12/09/2016 0101   BUN 11 08/04/2018 1000   CREATININE 0.89 08/04/2018 1000   CREATININE 0.78 08/16/2015 1021   CALCIUM 10.0 08/04/2018 1000   GFRNONAA 106 08/04/2018 1000   GFRNONAA >89 08/16/2015 1021   GFRAA 123 08/04/2018 1000   GFRAA >89 08/16/2015 1021   Renal function: CrCl cannot be calculated (Patient's most recent lab result is older than the maximum 21 days allowed.).  Clinical ASCVD: {YES/NO:21197} The 10-year ASCVD risk score Mikey Bussing DC Jr., et al., 2013) is: 11.4%   Values used to calculate the score:     Age: 9 years     Sex: Male     Is Non-Hispanic African American: Yes     Diabetic: No     Tobacco smoker: Yes     Systolic Blood Pressure: 443 mmHg     Is BP treated: Yes     HDL Cholesterol: 96 mg/dL     Total  Cholesterol: 203 mg/dL  A/P: Hypertension longstanding/newly diagnosed currently *** on current medications. BP Goal = *** mmHg. Patient {Is/is not:9024} adherent with current medications.  -{Meds adjust:18428} ***.  -F/u labs ordered - *** -Counseled on lifestyle modifications for blood pressure control including reduced dietary sodium, increased exercise, adequate sleep  Results reviewed and written information provided.   Total time in face-to-face counseling *** minutes.   F/U Clinic Visit in ***.  Patient seen with ***

## 2018-12-04 ENCOUNTER — Ambulatory Visit: Payer: Managed Care, Other (non HMO) | Admitting: Pharmacist

## 2019-02-01 ENCOUNTER — Other Ambulatory Visit: Payer: Self-pay

## 2019-02-01 ENCOUNTER — Encounter: Payer: Self-pay | Admitting: Internal Medicine

## 2019-02-01 ENCOUNTER — Ambulatory Visit: Payer: Managed Care, Other (non HMO) | Attending: Internal Medicine | Admitting: Internal Medicine

## 2019-02-01 DIAGNOSIS — Z800 Family history of malignant neoplasm of digestive organs: Secondary | ICD-10-CM

## 2019-02-01 DIAGNOSIS — F17200 Nicotine dependence, unspecified, uncomplicated: Secondary | ICD-10-CM | POA: Diagnosis not present

## 2019-02-01 DIAGNOSIS — I10 Essential (primary) hypertension: Secondary | ICD-10-CM

## 2019-02-01 DIAGNOSIS — Z2821 Immunization not carried out because of patient refusal: Secondary | ICD-10-CM

## 2019-02-01 DIAGNOSIS — Z9114 Patient's other noncompliance with medication regimen: Secondary | ICD-10-CM | POA: Diagnosis not present

## 2019-02-01 NOTE — Progress Notes (Signed)
Virtual Visit via Telephone Note Due to current restrictions/limitations of in-office visits due to the COVID-19 pandemic, this scheduled clinical appointment was converted to a telehealth visit  I connected with Caleb Heath on 02/01/19 at 8:46 a.m by telephone and verified that I am speaking with the correct person using two identifiers. I am in my office.  The patient is at work.  Only the patient and myself participated in this encounter.  I discussed the limitations, risks, security and privacy concerns of performing an evaluation and management service by telephone and the availability of in person appointments. I also discussed with the patient that there may be a patient responsible charge related to this service. The patient expressed understanding and agreed to proceed.   History of Present Illness: Patient with history of HTN,migraines, seasonal allergies, fhx of colon CA in father.  HYPERTENSION Currently taking: see medication list Med Adherence: []  Yes    [x]  No - still not taking his med every day.  "Just a thing with medicine and me." Medication side effects: []  Yes    [x]  No Adherence with salt restriction: [x]  Yes    []  No Home Monitoring?: []  Yes    [x]  No Monitoring Frequency: []  Yes    []  No Home BP results range: []  Yes    []  No SOB? []  Yes    [x]  No Chest Pain?: []  Yes    [x]  No Leg swelling?: []  Yes    [x]  No Headaches?: []  Yes    [x]  No Dizziness? []  Yes    [x]  No Comments:   Tob dep:  Never filled the rxn for the nicotine gum prescribed on last visit. When asked why pt said "I don't know.  I gonna have figure something out."  Fhx of Colon CA:  Given Dr. Si Gaul # on last visit to schedule c-scope.  However, he never called to schedule.  Pt states "I put work before my health and I need to stop that."  He states that he has a hemorrhoid on the outside which he would like to have removed.  HM: Patient declines flu vaccine  Outpatient Encounter Medications  as of 02/01/2019  Medication Sig  . cetirizine (ZYRTEC) 10 MG tablet Take 1 tablet (10 mg total) by mouth daily.  . fluticasone (FLONASE) 50 MCG/ACT nasal spray Place 2 sprays into both nostrils daily.  Marland Kitchen losartan (COZAAR) 50 MG tablet Take 1 tablet (50 mg total) by mouth daily.  . nicotine polacrilex (NICORETTE) 2 MG gum Take 1 each (2 mg total) by mouth as needed for smoking cessation (Chew no more than 30 pieces/day).   No facility-administered encounter medications on file as of 02/01/2019.       Observations/Objective: Results for orders placed or performed in visit on 10/30/18  POCT glycosylated hemoglobin (Hb A1C)  Result Value Ref Range   Hemoglobin A1C     HbA1c POC (<> result, manual entry) 5.4 4.0 - 5.6 %   HbA1c, POC (prediabetic range)     HbA1c, POC (controlled diabetic range)    POCT glucose (manual entry)  Result Value Ref Range   POC Glucose 101 (A) 70 - 99 mg/dl     Chemistry      Component Value Date/Time   NA 138 08/04/2018 1000   K 4.3 08/04/2018 1000   CL 100 08/04/2018 1000   CO2 23 08/04/2018 1000   BUN 11 08/04/2018 1000   CREATININE 0.89 08/04/2018 1000   CREATININE 0.78 08/16/2015 1021  Component Value Date/Time   CALCIUM 10.0 08/04/2018 1000   ALKPHOS 67 08/18/2018 0959   AST 44 (H) 08/18/2018 0959   ALT 28 08/18/2018 0959   BILITOT 0.4 08/18/2018 0959       Assessment and Plan: 1. Essential hypertension Patient is noncompliance with taking medication. He denies any side effects from the medication. Discussed health risks associated with uncontrolled blood pressure including cardiovascular events.  Patient states he will try to do better.  He will call his insurance and find out whether they will pay for him to get a home blood pressure monitoring device.  2. Tobacco dependence Patient seems noncommittal in terms of trial of smoking cessation.  He tells me he does not know whether the nicotine gum will work.  I told him he can only give  it a try.  He is noncommittal about doing so.  He is not interested in trying Chantix or Wellbutrin.  3. Family history of colon cancer Again strongly advised him to call Dr. Donavan Burnet office to get his colonoscopy scheduled given his family history.  4. Noncompliance with medications   5. Influenza vaccination declined    Follow Up Instructions: 4 mths   I discussed the assessment and treatment plan with the patient. The patient was provided an opportunity to ask questions and all were answered. The patient agreed with the plan and demonstrated an understanding of the instructions.   The patient was advised to call back or seek an in-person evaluation if the symptoms worsen or if the condition fails to improve as anticipated.  I provided 8 minutes of non-face-to-face time during this encounter.   Jonah Blue, MD

## 2019-05-24 ENCOUNTER — Other Ambulatory Visit: Payer: Self-pay

## 2019-05-24 ENCOUNTER — Ambulatory Visit (HOSPITAL_COMMUNITY)
Admission: EM | Admit: 2019-05-24 | Discharge: 2019-05-24 | Disposition: A | Discharge status: Home / Self Care | Payer: Managed Care, Other (non HMO) | Attending: Family Medicine | Admitting: Family Medicine

## 2019-05-24 ENCOUNTER — Encounter (HOSPITAL_COMMUNITY): Payer: Self-pay

## 2019-05-24 DIAGNOSIS — M545 Low back pain, unspecified: Secondary | ICD-10-CM

## 2019-05-24 DIAGNOSIS — J329 Chronic sinusitis, unspecified: Secondary | ICD-10-CM | POA: Diagnosis not present

## 2019-05-24 MED ORDER — KETOROLAC TROMETHAMINE 60 MG/2ML IM SOLN
60.0000 mg | Freq: Once | INTRAMUSCULAR | Status: AC
  Administered 2019-05-24: 60 mg via INTRAMUSCULAR

## 2019-05-24 MED ORDER — LIDOCAINE 4 % EX PTCH: 1.0000 | MEDICATED_PATCH | Freq: Three times a day (TID) | CUTANEOUS | 0 refills | Status: AC

## 2019-05-24 MED ORDER — AMOXICILLIN 875 MG PO TABS: 875.0000 mg | ORAL_TABLET | Freq: Two times a day (BID) | ORAL | 0 refills | Status: AC

## 2019-05-24 MED ORDER — DEXAMETHASONE SODIUM PHOSPHATE 10 MG/ML IJ SOLN
10.0000 mg | Freq: Once | INTRAMUSCULAR | Status: AC
  Administered 2019-05-24: 10 mg via INTRAMUSCULAR

## 2019-05-24 MED ORDER — DEXAMETHASONE SODIUM PHOSPHATE 10 MG/ML IJ SOLN
INTRAMUSCULAR | Status: AC
  Filled 2019-05-24: qty 1

## 2019-05-24 MED ORDER — KETOROLAC TROMETHAMINE 60 MG/2ML IM SOLN
INTRAMUSCULAR | Status: AC
  Filled 2019-05-24: qty 2

## 2019-05-24 NOTE — ED Provider Notes (Signed)
[facility]    CSN: [id number] Arrival date & time: 05/24/19  1424      History   Chief Complaint Chief Complaint  Patient presents with  . Back Pain  . Headache  . Facial Pain    HPI Caleb Heath is a 44 y.o. male.   Patient reports low back pain for the last few days.  Reports history of bulging disc, cannot recall which one.  Also reports facial pain and swelling to the right side.  Denies dental or mouth pain.  Reports that he has had a lot of issues with his sinuses in the last week.  He feels like this is a sinus infection, as he usually has these with changing of the weather.  Denies fever, cough nausea, vomiting, diarrhea, rash, other symptoms.  ROS Per HPI  The history is provided by the patient.    Past Medical History:  Diagnosis Date  . Anxiety   . Fx wrist   . Hypertension   . Migraine   . Sciatica   . Thyroid disease     Patient Active Problem List   Diagnosis Date Noted  . Influenza vaccination declined 02/01/2019  . 23-polyvalent pneumococcal polysaccharide vaccine declined 10/30/2018  . Tobacco dependence 10/30/2018  . Noncompliance with medications 10/30/2018  . Family history of colon cancer 10/30/2018  . Seasonal allergic rhinitis due to pollen 08/25/2017  . Abnormal thyroid function test 08/25/2017  . Intractable migraine without aura and without status migrainosus 08/25/2017  . HTN (hypertension) 09/02/2012  . Muscle cramps 09/02/2012    History reviewed. No pertinent surgical history.     Home Medications    Prior to Admission medications   Medication Sig Start Date End Date Taking? Authorizing Provider  amoxicillin (AMOXIL) 875 MG tablet Take 1 tablet (875 mg total) by mouth 2 (two) times daily for 10 days. 05/24/19 06/03/19  Moshe Cipro, NP  cetirizine (ZYRTEC) 10 MG tablet Take 1 tablet (10 mg total) by mouth daily. 08/25/17   Marcine Matar, MD  fluticasone (FLONASE) 50 MCG/ACT nasal spray Place 2 sprays into  both nostrils daily. 08/25/17   Marcine Matar, MD  Lidocaine (HM LIDOCAINE PATCH) 4 % PTCH Apply 1 patch topically in the morning, at noon, and at bedtime. 05/24/19 06/23/19  Moshe Cipro, NP  losartan (COZAAR) 50 MG tablet Take 1 tablet (50 mg total) by mouth daily. 07/28/18   Marcine Matar, MD  nicotine polacrilex (NICORETTE) 2 MG gum Take 1 each (2 mg total) by mouth as needed for smoking cessation (Chew no more than 30 pieces/day). 10/30/18   Marcine Matar, MD    Family History Family History  Problem Relation Age of Onset  . Hypertension Mother   . Colon cancer Father 52  . Hypertension Maternal Aunt   . Heart attack Maternal Grandmother   . Diabetes Maternal Grandmother     Social History Social History   Tobacco Use  . Smoking status: Current Every Day Smoker    Packs/day: 0.50    Types: Cigarettes  . Smokeless tobacco: Never Used  . Tobacco comment: .5 ppd  Substance Use Topics  . Alcohol use: Yes    Alcohol/week: 12.0 standard drinks    Types: 12 Cans of beer per week    Comment: weekend  . Drug use: No     Allergies   Hydrochlorothiazide and Amlodipine   Review of Systems Review of Systems   Physical Exam Triage Vital Signs ED Triage Vitals  Enc Vitals Group     BP 05/24/19 1528 (!) 184/100     Pulse Rate 05/24/19 1528 (!) 118     Resp 05/24/19 1528 20     Temp 05/24/19 1528 98.8 F (37.1 C)     Temp Source 05/24/19 1528 Oral     SpO2 05/24/19 1528 99 %     Weight --      Height --      Head Circumference --      Peak Flow --      Pain Score 05/24/19 1526 8     Pain Loc --      Pain Edu? --      Excl. in [place]? --    No data found.  Updated Vital Signs BP (!) 184/100 (BP Location: Right Arm)   Pulse (!) 118   Temp 98.8 F (37.1 C) (Oral)   Resp 20   SpO2 99%   Visual Acuity Right Eye Distance:   Left Eye Distance:   Bilateral Distance:    Right Eye Near:   Left Eye Near:    Bilateral Near:     Physical Exam Vitals  and nursing note reviewed.  Constitutional:      General: He is not in acute distress.    Appearance: He is well-developed and normal weight.  HENT:     Head: Normocephalic and atraumatic.     Comments: Right frontal and maxillary sinus tenderness on palpation    Mouth/Throat:     Mouth: Mucous membranes are moist.  Eyes:     Extraocular Movements: Extraocular movements intact.     Conjunctiva/sclera: Conjunctivae normal.  Cardiovascular:     Rate and Rhythm: Normal rate and regular rhythm.     Heart sounds: No murmur.  Pulmonary:     Effort: Pulmonary effort is normal. No respiratory distress.     Breath sounds: Normal breath sounds.  Abdominal:     General: Bowel sounds are normal.     Palpations: Abdomen is soft.     Tenderness: There is no abdominal tenderness.  Musculoskeletal:        General: Tenderness present.     Cervical back: Neck supple.     Comments: Limited spinal ROM, especially with twisting on exam  Skin:    General: Skin is warm and dry.     Capillary Refill: Capillary refill takes less than 2 seconds.  Neurological:     General: No focal deficit present.     Mental Status: He is alert and oriented to person, place, and time.  Psychiatric:        Mood and Affect: Mood normal.        Behavior: Behavior normal.      UC Treatments / Results  Labs (all labs ordered are listed, but only abnormal results are displayed) Labs Reviewed - No data to display  EKG   Radiology No results found.  Procedures Procedures (including critical care time)  Medications Ordered in UC Medications  dexamethasone (DECADRON) injection 10 mg (10 mg Intramuscular Given 05/24/19 1555)  ketorolac (TORADOL) injection 60 mg (60 mg Intramuscular Given 05/24/19 1555)    Initial Impression / Assessment and Plan / UC Course  I have reviewed the triage vital signs and the nursing notes.  Pertinent labs & imaging results that were available during my care of the patient were  reviewed by me and considered in my medical decision making (see chart for details).    Low back pain is  likely recurring disc protrusion.  Toradol in office today.  Lidocaine patches prescribed.  Acute sinusitis for the last 7 days, amoxicillin 875 twice daily x10 days.  Decadron 10 mg IM given in office today.  Instructed to follow-up if symptoms are not improving over the next few days.  Instructed on when to go to the ER. Final Clinical Impressions(s) / UC Diagnoses   Final diagnoses:  Sinusitis, unspecified chronicity, unspecified location  Low back pain without sciatica, unspecified back pain laterality, unspecified chronicity     Discharge Instructions     Take the prescribed ibuprofen as needed for your pain.    Follow up with an orthopedist or your primary care provider if your pain is not improving.  Go to the emergency department if you have worsening pain or develop new symptoms such as difficulty with urination, weakness, numbness, loss of control of your bladder or bowels, fever, chills or other concerns.  You also have a sinus infection. I have sent in amoxicillin for this. Twice a day for 10  days.      ED Prescriptions    Medication Sig Dispense Auth. Provider   amoxicillin (AMOXIL) 875 MG tablet Take 1 tablet (875 mg total) by mouth 2 (two) times daily for 10 days. 20 tablet Moshe Cipro, NP   Lidocaine (HM LIDOCAINE PATCH) 4 % PTCH Apply 1 patch topically in the morning, at noon, and at bedtime. 90 patch Moshe Cipro, NP     PDMP not reviewed this encounter.   Moshe Cipro, NP 05/24/19 1635

## 2019-05-24 NOTE — ED Triage Notes (Signed)
Pt presents with recurring back pain, left side facial pain, and headache X 1 week.

## 2019-05-24 NOTE — Discharge Instructions (Signed)
Take the prescribed ibuprofen as needed for your pain.    Follow up with an orthopedist or your primary care provider if your pain is not improving.  Go to the emergency department if you have worsening pain or develop new symptoms such as difficulty with urination, weakness, numbness, loss of control of your bladder or bowels, fever, chills or other concerns.  You also have a sinus infection. I have sent in amoxicillin for this. Twice a day for 10  days.

## 2019-06-21 ENCOUNTER — Ambulatory Visit: Payer: Managed Care, Other (non HMO) | Admitting: Internal Medicine

## 2019-07-05 ENCOUNTER — Ambulatory Visit (HOSPITAL_COMMUNITY)
Admission: EM | Admit: 2019-07-05 | Discharge: 2019-07-05 | Disposition: A | Discharge status: Home / Self Care | Payer: Managed Care, Other (non HMO) | Attending: Family Medicine | Admitting: Family Medicine

## 2019-07-05 ENCOUNTER — Other Ambulatory Visit: Payer: Self-pay

## 2019-07-05 DIAGNOSIS — L03031 Cellulitis of right toe: Secondary | ICD-10-CM

## 2019-07-05 MED ORDER — CEPHALEXIN 500 MG PO CAPS: 500.0000 mg | ORAL_CAPSULE | Freq: Four times a day (QID) | ORAL | 0 refills | Status: AC

## 2019-07-05 MED ORDER — IBUPROFEN 800 MG PO TABS: 800.0000 mg | ORAL_TABLET | Freq: Three times a day (TID) | ORAL | 0 refills | Status: DC

## 2019-07-05 NOTE — Discharge Instructions (Signed)
Please begin Keflex 4 times daily for the next week, take with food if able May try soaking ~2-3 times daily in warm water, dry really well afterwards Use anti-inflammatories for pain/swelling. You may take up to 800 mg Ibuprofen every 8 hours with food. You may supplement Ibuprofen with Tylenol [phone] mg every 8 hours.

## 2019-07-05 NOTE — ED Triage Notes (Signed)
Pt c/o R great toe pain x 1 week, denies injury

## 2019-07-05 NOTE — ED Provider Notes (Signed)
[facility]    CSN: [id number] Arrival date & time: 07/05/19  1614      History   Chief Complaint Chief Complaint  Patient presents with  . Toe Pain    HPI Caleb Heath is a 44 y.o. male history of hypertension, migraines, hyperthyroid, tobacco use, presenting today for evaluation of toe pain.  Patient has had toe pain for approximately 1 week to his right great toe.  Mainly feels pain and burning sensation around the nailbed.  He has noticed some swelling associated with this.  Denies history of gout.  Denies injury or trauma.  Does report recently using some new steel toed boots approximately 1 month ago.  Pain is worse with wearing steel toed boots.  Denies history of diabetes.  Denies any symptoms in other toes.  HPI  Past Medical History:  Diagnosis Date  . Anxiety   . Fx wrist   . Hypertension   . Migraine   . Sciatica   . Thyroid disease     Patient Active Problem List   Diagnosis Date Noted  . Influenza vaccination declined 02/01/2019  . 23-polyvalent pneumococcal polysaccharide vaccine declined 10/30/2018  . Tobacco dependence 10/30/2018  . Noncompliance with medications 10/30/2018  . Family history of colon cancer 10/30/2018  . Seasonal allergic rhinitis due to pollen 08/25/2017  . Abnormal thyroid function test 08/25/2017  . Intractable migraine without aura and without status migrainosus 08/25/2017  . HTN (hypertension) 09/02/2012  . Muscle cramps 09/02/2012    No past surgical history on file.     Home Medications    Prior to Admission medications   Medication Sig Start Date End Date Taking? Authorizing Provider  cephALEXin (KEFLEX) 500 MG capsule Take 1 capsule (500 mg total) by mouth 4 (four) times daily for 7 days. 07/05/19 07/12/19  Ebonique Hallstrom C, PA-C  cetirizine (ZYRTEC) 10 MG tablet Take 1 tablet (10 mg total) by mouth daily. 08/25/17   Marcine Matar, MD  fluticasone (FLONASE) 50 MCG/ACT nasal spray Place 2 sprays into both  nostrils daily. 08/25/17   Marcine Matar, MD  ibuprofen (ADVIL) 800 MG tablet Take 1 tablet (800 mg total) by mouth 3 (three) times daily. 07/05/19   Shabre Kreher C, PA-C  losartan (COZAAR) 50 MG tablet Take 1 tablet (50 mg total) by mouth daily. 07/28/18   Marcine Matar, MD  nicotine polacrilex (NICORETTE) 2 MG gum Take 1 each (2 mg total) by mouth as needed for smoking cessation (Chew no more than 30 pieces/day). 10/30/18   Marcine Matar, MD    Family History Family History  Problem Relation Age of Onset  . Hypertension Mother   . Colon cancer Father 66  . Hypertension Maternal Aunt   . Heart attack Maternal Grandmother   . Diabetes Maternal Grandmother     Social History Social History   Tobacco Use  . Smoking status: Current Every Day Smoker    Packs/day: 0.50    Types: Cigarettes  . Smokeless tobacco: Never Used  . Tobacco comment: .5 ppd  Substance Use Topics  . Alcohol use: Yes    Alcohol/week: 12.0 standard drinks    Types: 12 Cans of beer per week    Comment: weekend  . Drug use: No     Allergies   Amoxicillin, Hydrochlorothiazide, and Amlodipine   Review of Systems Review of Systems  Constitutional: Negative for fatigue and fever.  Eyes: Negative for redness, itching and visual disturbance.  Respiratory: Negative for  shortness of breath.   Cardiovascular: Negative for chest pain and leg swelling.  Gastrointestinal: Negative for nausea and vomiting.  Musculoskeletal: Positive for arthralgias and joint swelling. Negative for myalgias.  Skin: Negative for color change, rash and wound.  Neurological: Negative for dizziness, syncope, weakness, light-headedness and headaches.     Physical Exam Triage Vital Signs ED Triage Vitals  Enc Vitals Group     BP 07/05/19 1652 (!) 161/123     Pulse Rate 07/05/19 1652 (!) 117     Resp 07/05/19 1652 16     Temp 07/05/19 1652 98.8 F (37.1 C)     Temp src --      SpO2 07/05/19 1652 98 %     Weight  07/05/19 1653 170 lb (77.1 kg)     Height 07/05/19 1653 6\' 2"  (1.88 m)     Head Circumference --      Peak Flow --      Pain Score 07/05/19 1653 6     Pain Loc --      Pain Edu? --      Excl. in [place]? --    No data found.  Updated Vital Signs BP (!) 161/123   Pulse (!) 117   Temp 98.8 F (37.1 C)   Resp 16   Ht 6\' 2"  (1.88 m)   Wt 170 lb (77.1 kg)   SpO2 98%   BMI 21.83 kg/m   Visual Acuity Right Eye Distance:   Left Eye Distance:   Bilateral Distance:    Right Eye Near:   Left Eye Near:    Bilateral Near:     Physical Exam Vitals and nursing note reviewed.  Constitutional:      Appearance: He is well-developed.     Comments: No acute distress  HENT:     Head: Normocephalic and atraumatic.     Nose: Nose normal.  Eyes:     Conjunctiva/sclera: Conjunctivae normal.  Cardiovascular:     Rate and Rhythm: Normal rate.  Pulmonary:     Effort: Pulmonary effort is normal. No respiratory distress.  Abdominal:     General: There is no distension.  Musculoskeletal:        General: Normal range of motion.     Cervical back: Neck supple.     Comments: Right great toe mild swelling compared to left, very faint erythema noted to proximal and medial nail fold areas, no significant erythema or swelling noted to MTP joint.  Tender to palpation diffusely around nail bed, no obvious ingrown nail noted  Nail appears thickened  Skin:    General: Skin is warm and dry.  Neurological:     Mental Status: He is alert and oriented to person, place, and time.      UC Treatments / Results  Labs (all labs ordered are listed, but only abnormal results are displayed) Labs Reviewed - No data to display  EKG   Radiology No results found.  Procedures Procedures (including critical care time)  Medications Ordered in UC Medications - No data to display  Initial Impression / Assessment and Plan / UC Course  I have reviewed the triage vital signs and the nursing  notes.  Pertinent labs & imaging results that were available during my care of the patient were reviewed by me and considered in my medical decision making (see chart for details).     No injury to nail, do not suspect acute bony abnormality.  Very faint erythema, will go out and  cover for paronychia and treat with Keflex, along with initiating on anti-inflammatories, ibuprofen and Tylenol, elevate, may also try warm soaks.  Continue to monitor for resolution,Discussed strict return precautions. Patient verbalized understanding and is agreeable with plan.   Final Clinical Impressions(s) / UC Diagnoses   Final diagnoses:  Paronychia of great toe, right     Discharge Instructions     Please begin Keflex 4 times daily for the next week, take with food if able May try soaking ~2-3 times daily in warm water, dry really well afterwards Use anti-inflammatories for pain/swelling. You may take up to 800 mg Ibuprofen every 8 hours with food. You may supplement Ibuprofen with Tylenol [phone] mg every 8 hours.      ED Prescriptions    Medication Sig Dispense Auth. Provider   ibuprofen (ADVIL) 800 MG tablet Take 1 tablet (800 mg total) by mouth 3 (three) times daily. 21 tablet Dalisa Forrer C, PA-C   cephALEXin (KEFLEX) 500 MG capsule Take 1 capsule (500 mg total) by mouth 4 (four) times daily for 7 days. 28 capsule Ona Roehrs, [place] C, PA-C     PDMP not reviewed this encounter.   Lew Dawes, [place] 07/05/19 1751

## 2019-07-13 ENCOUNTER — Encounter: Payer: Self-pay | Admitting: Internal Medicine

## 2019-07-13 ENCOUNTER — Ambulatory Visit: Payer: Managed Care, Other (non HMO) | Attending: Internal Medicine | Admitting: Internal Medicine

## 2019-07-13 ENCOUNTER — Other Ambulatory Visit: Payer: Self-pay

## 2019-07-13 DIAGNOSIS — I10 Essential (primary) hypertension: Secondary | ICD-10-CM

## 2019-07-13 DIAGNOSIS — L600 Ingrowing nail: Secondary | ICD-10-CM

## 2019-07-13 DIAGNOSIS — Z1211 Encounter for screening for malignant neoplasm of colon: Secondary | ICD-10-CM | POA: Diagnosis not present

## 2019-07-13 DIAGNOSIS — J301 Allergic rhinitis due to pollen: Secondary | ICD-10-CM | POA: Diagnosis not present

## 2019-07-13 DIAGNOSIS — Z2821 Immunization not carried out because of patient refusal: Secondary | ICD-10-CM

## 2019-07-13 MED ORDER — LOSARTAN POTASSIUM 50 MG PO TABS: 50.0000 mg | ORAL_TABLET | Freq: Every day | ORAL | 5 refills | Status: DC

## 2019-07-13 MED ORDER — FLUTICASONE PROPIONATE 50 MCG/ACT NA SUSP: 2.0000 | Freq: Every day | NASAL | 6 refills | Status: DC

## 2019-07-13 MED ORDER — CETIRIZINE HCL 10 MG PO TABS: 10.0000 mg | ORAL_TABLET | Freq: Every day | ORAL | 11 refills | Status: DC

## 2019-07-13 NOTE — Progress Notes (Signed)
Virtual Visit via Telephone Note Due to current restrictions/limitations of in-office visits due to the COVID-19 pandemic, this scheduled clinical appointment was converted to a telehealth visit  I connected with Caleb Heath on 07/13/19 at 10:31 a.m by telephone and verified that I am speaking with the correct person using two identifiers. I am in my office.  The patient is at home.  Only the patient and myself participated in this encounter.  I discussed the limitations, risks, security and privacy concerns of performing an evaluation and management service by telephone and the availability of in person appointments. I also discussed with the patient that there may be a patient responsible charge related to this service. The patient expressed understanding and agreed to proceed.   History of Present Illness: Patient with history of HTN,tob dep, migraines, seasonal allergies, fhx of colon CA in father.Last visit was 01/2019.  Purpose of today's visit is chronic disease management  Needs RF on allergy meds which are Zyrtec and Flonase nasal spray.  Symptoms include sneezing, runny nose and itchy eyes.  Patient seen in the emergency room 07/05/2019 with infected ingrown toenail on the right foot.  He was prescribed some antibiotics Keflex.  He reports that the infection is cleared but he was told to follow-up with [place].  He is agreeable for referral to a podiatrist to have the ingrown toenail clipped.  I note that his blood pressure was elevated while in the emergency room.  Reading was 161/123.  He tells me that he is compliant with taking Cozaar.  However patient has history of noncompliance.  I see that the last prescription for Cozaar was written in May of last year with only 3 refills each for a month supply which means that he should have been out of the Cozaar a long time ago.  He does not have a device to check his blood pressure.  He has family history of colon cancer.  I have been  encouraging him to have a colonoscopy done.  Today he tells me that he plans to schedule c-scope around his next vacation time.  He said that he will call us back once he no his next vacation time so that we can schedule the colonoscopy for him.  HM: I inquired whether he plans to have the Covid 19 vaccination.  Patient states that he has decided not to get the vaccination.   Outpatient Encounter Medications as of 07/13/2019  Medication Sig  . cetirizine (ZYRTEC) 10 MG tablet Take 1 tablet (10 mg total) by mouth daily.  . fluticasone (FLONASE) 50 MCG/ACT nasal spray Place 2 sprays into both nostrils daily.  Marland Kitchen ibuprofen (ADVIL) 800 MG tablet Take 1 tablet (800 mg total) by mouth 3 (three) times daily.  Marland Kitchen losartan (COZAAR) 50 MG tablet Take 1 tablet (50 mg total) by mouth daily.  . nicotine polacrilex (NICORETTE) 2 MG gum Take 1 each (2 mg total) by mouth as needed for smoking cessation (Chew no more than 30 pieces/day).   No facility-administered encounter medications on file as of 07/13/2019.    Observations/Objective: No direct observation done as this was a telephone encounter  Assessment and Plan: 1. Seasonal allergic rhinitis due to pollen - cetirizine (ZYRTEC) 10 MG tablet; Take 1 tablet (10 mg total) by mouth daily.  Dispense: 30 tablet; Refill: 11 - fluticasone (FLONASE) 50 MCG/ACT nasal spray; Place 2 sprays into both nostrils daily.  Dispense: 16 g; Refill: 6  2. Essential hypertension Encourage compliance with medication.  Discussed  cardiovascular risks associated with uncontrolled blood pressure. - losartan (COZAAR) 50 MG tablet; Take 1 tablet (50 mg total) by mouth daily.  Dispense: 30 tablet; Refill: 5  3. Ingrown toenail of right foot We will refer to podiatry.  4. Screening for colon cancer Again encouraged him to get colonoscopy done.  He states he will call me back when he knows his vacation time so that he can schedule it around his vacation.  5. COVID-19 virus  vaccination declined Discussed and encourage getting the COVID-19 vaccine but patient declined.  Follow Up Instructions: 3 mths   I discussed the assessment and treatment plan with the patient. The patient was provided an opportunity to ask questions and all were answered. The patient agreed with the plan and demonstrated an understanding of the instructions.   The patient was advised to call back or seek an in-person evaluation if the symptoms worsen or if the condition fails to improve as anticipated.  I provided 7 minutes of non-face-to-face time during this encounter.   Jonah Blue, MD

## 2019-08-05 ENCOUNTER — Encounter: Payer: Self-pay | Admitting: Podiatry

## 2019-08-05 ENCOUNTER — Ambulatory Visit (INDEPENDENT_AMBULATORY_CARE_PROVIDER_SITE_OTHER): Payer: Managed Care, Other (non HMO) | Admitting: Podiatry

## 2019-08-05 ENCOUNTER — Other Ambulatory Visit: Payer: Self-pay

## 2019-08-05 DIAGNOSIS — L600 Ingrowing nail: Secondary | ICD-10-CM

## 2019-08-05 MED ORDER — NEOMYCIN-POLYMYXIN-HC 1 % OT SOLN: OTIC | 1 refills | Status: DC

## 2019-08-05 NOTE — Progress Notes (Signed)
Subjective:  Patient ID: Caleb Heath, male    DOB: Jun 02, 1975,  MRN: [id number] HPI Chief Complaint  Patient presents with  . Toe Pain    Hallux right - medial border, tender x couple weeks, tried trimming out, urgent care Rx'd antibiotic-completed  . New Patient (Initial Visit)    44 y.o. male presents with the above complaint.   ROS: Denies fever chills nausea vomiting muscle aches pains calf pain back pain chest pain shortness of breath.  Past Medical History:  Diagnosis Date  . Anxiety   . Fx wrist   . Hypertension   . Migraine   . Sciatica   . Thyroid disease    No past surgical history on file.  Current Outpatient Medications:  .  amLODipine (NORVASC) 2.5 MG tablet, Take by mouth., Disp: , Rfl:  .  cetirizine (ZYRTEC) 10 MG tablet, Take 1 tablet (10 mg total) by mouth daily., Disp: 30 tablet, Rfl: 11 .  fluticasone (FLONASE) 50 MCG/ACT nasal spray, Place 2 sprays into both nostrils daily., Disp: 16 g, Rfl: 6 .  ibuprofen (ADVIL) 800 MG tablet, Take 1 tablet (800 mg total) by mouth 3 (three) times daily., Disp: 21 tablet, Rfl: 0 .  losartan (COZAAR) 50 MG tablet, Take 1 tablet (50 mg total) by mouth daily., Disp: 30 tablet, Rfl: 5 .  NEOMYCIN-POLYMYXIN-HYDROCORTISONE (CORTISPORIN) 1 % SOLN OTIC solution, Apply 1-2 drops to toe BID after soaking, Disp: 10 mL, Rfl: 1 .  nicotine polacrilex (NICORETTE) 2 MG gum, Take 1 each (2 mg total) by mouth as needed for smoking cessation (Chew no more than 30 pieces/day)., Disp: 100 tablet, Rfl: 1  Allergies  Allergen Reactions  . Amoxicillin Other (See Comments)    "I had hiccups and CP"  . Hydrochlorothiazide   . Amlodipine Nausea Only and Other (See Comments)    drowsy   Review of Systems Objective:  There were no vitals filed for this visit.  General: Well developed, nourished, in no acute distress, alert and oriented x3   Dermatological: Skin is warm, dry and supple bilateral. Nails x 10 are well maintained; remaining  integument appears unremarkable at this time. There are no open sores, no preulcerative lesions, no rash or signs of infection present.  Vascular: Dorsalis Pedis artery and Posterior Tibial artery pedal pulses are 2/4 bilateral with immedate capillary fill time. Pedal hair growth present. No varicosities and no lower extremity edema present bilateral.   Neruologic: Grossly intact via light touch bilateral. Vibratory intact via tuning fork bilateral. Protective threshold with Semmes Wienstein monofilament intact to all pedal sites bilateral. Patellar and Achilles deep tendon reflexes 2+ bilateral. No Babinski or clonus noted bilateral.   Musculoskeletal: No gross boney pedal deformities bilateral. No pain, crepitus, or limitation noted with foot and ankle range of motion bilateral. Muscular strength 5/5 in all groups tested bilateral.  Gait: Unassisted, Nonantalgic.    Radiographs:  None taken  Assessment & Plan:   Assessment: Ingrown toenail tibial border hallux right  Plan: Performed a chemical matricectomy today tibial border the hallux right.  He tolerated procedure well after local anesthetic was administered.  He will receive both oral and written home-going instructions prior to leaving the facility.  We went over these orally.  I did order him prescription for Corticosporin otic to be applied twice daily after soaking he understands this and is amenable to it I would like to follow-up with him in about 2 weeks from nail check.  I did okay him to  be out of work for the next 2 days.     Alijah Hyde T. [place], [place]

## 2019-08-05 NOTE — Patient Instructions (Signed)

## 2019-08-19 ENCOUNTER — Ambulatory Visit (INDEPENDENT_AMBULATORY_CARE_PROVIDER_SITE_OTHER): Payer: Managed Care, Other (non HMO) | Admitting: Podiatry

## 2019-08-19 ENCOUNTER — Other Ambulatory Visit: Payer: Self-pay

## 2019-08-19 ENCOUNTER — Encounter: Payer: Self-pay | Admitting: Podiatry

## 2019-08-19 DIAGNOSIS — Z9889 Other specified postprocedural states: Secondary | ICD-10-CM

## 2019-08-19 DIAGNOSIS — L600 Ingrowing nail: Secondary | ICD-10-CM

## 2019-08-19 NOTE — Progress Notes (Signed)
He presents today for follow-up of his matrixectomy hallux right.  He states that is doing just great has not bothered him since we performed the procedure.  States that he continues to soak it on a regular basis and continues to play his Corticosporin otic drops twice daily.  Objective: Vital signs are stable he is alert and oriented x3 evaluation of the right hallux demonstrates no erythema edema cellulitis drainage odor margin is still slightly open and deep has not granulated incompletely no epithelialization has been completed.  Assessment: Well-healing surgical toe tibial border hallux right.  Plan: Recommend he continue to soak until the border has completely filled in.  He states that he will do so without complication.  Cover during the daytime leave open at bedtime should he have any questions or concerns he will notify us immediately should he see any signs of infection which were discussed with him today he will notify us immediately.

## 2019-10-28 ENCOUNTER — Other Ambulatory Visit: Payer: Self-pay

## 2019-10-28 ENCOUNTER — Encounter (HOSPITAL_COMMUNITY): Payer: Self-pay

## 2019-10-28 ENCOUNTER — Ambulatory Visit (HOSPITAL_COMMUNITY)
Admission: EM | Admit: 2019-10-28 | Discharge: 2019-10-28 | Disposition: A | Discharge status: Home / Self Care | Payer: Managed Care, Other (non HMO) | Attending: Urgent Care | Admitting: Urgent Care

## 2019-10-28 DIAGNOSIS — M545 Low back pain, unspecified: Secondary | ICD-10-CM

## 2019-10-28 DIAGNOSIS — S39012A Strain of muscle, fascia and tendon of lower back, initial encounter: Secondary | ICD-10-CM

## 2019-10-28 DIAGNOSIS — M62830 Muscle spasm of back: Secondary | ICD-10-CM

## 2019-10-28 MED ORDER — TIZANIDINE HCL 4 MG PO TABS: 4.0000 mg | ORAL_TABLET | Freq: Every day | ORAL | 0 refills | Status: DC

## 2019-10-28 MED ORDER — NAPROXEN 500 MG PO TABS: 500.0000 mg | ORAL_TABLET | Freq: Two times a day (BID) | ORAL | 0 refills | Status: DC

## 2019-10-28 NOTE — ED Triage Notes (Signed)
Pt presents with back since yesterday.

## 2019-10-28 NOTE — ED Provider Notes (Signed)
[facility]   MRN: [id number] DOB: August 29, 1975  Subjective:   Caleb Heath is a 44 y.o. male presenting for 1 day history of acute onset recurrent low back pain.  Denies falls, trauma.  Drinks about 3 or 4 bottles of water a day.  Reports that he has strenuous work activities, does very heavy lifting of varying weights and is a fast-paced job.  Denies weakness, shooting pains, numbness or tingling.  No rashes.  Denies dysuria, hematuria.  No current facility-administered medications for this encounter.  Current Outpatient Medications:  .  amLODipine (NORVASC) 2.5 MG tablet, Take by mouth., Disp: , Rfl:  .  cetirizine (ZYRTEC) 10 MG tablet, Take 1 tablet (10 mg total) by mouth daily., Disp: 30 tablet, Rfl: 11 .  fluticasone (FLONASE) 50 MCG/ACT nasal spray, Place 2 sprays into both nostrils daily., Disp: 16 g, Rfl: 6 .  ibuprofen (ADVIL) 800 MG tablet, Take 1 tablet (800 mg total) by mouth 3 (three) times daily., Disp: 21 tablet, Rfl: 0 .  losartan (COZAAR) 50 MG tablet, Take 1 tablet (50 mg total) by mouth daily., Disp: 30 tablet, Rfl: 5 .  NEOMYCIN-POLYMYXIN-HYDROCORTISONE (CORTISPORIN) 1 % SOLN OTIC solution, Apply 1-2 drops to toe BID after soaking, Disp: 10 mL, Rfl: 1 .  nicotine polacrilex (NICORETTE) 2 MG gum, Take 1 each (2 mg total) by mouth as needed for smoking cessation (Chew no more than 30 pieces/day)., Disp: 100 tablet, Rfl: 1   Allergies  Allergen Reactions  . Amoxicillin Other (See Comments)    "I had hiccups and CP"  . Hydrochlorothiazide   . Amlodipine Nausea Only and Other (See Comments)    drowsy    Past Medical History:  Diagnosis Date  . Anxiety   . Fx wrist   . Hypertension   . Migraine   . Sciatica   . Thyroid disease      History reviewed. No pertinent surgical history.  Family History  Problem Relation Age of Onset  . Hypertension Mother   . Colon cancer Father 29  . Hypertension Maternal Aunt   . Heart attack Maternal Grandmother   .  Diabetes Maternal Grandmother     Social History   Tobacco Use  . Smoking status: Current Every Day Smoker    Packs/day: 0.50    Types: Cigarettes  . Smokeless tobacco: Never Used  . Tobacco comment: .5 ppd  Substance Use Topics  . Alcohol use: Yes    Alcohol/week: 12.0 standard drinks    Types: 12 Cans of beer per week    Comment: weekend  . Drug use: No    ROS   Objective:   Vitals: BP (!) 156/96 (BP Location: Right Arm)   Pulse (!) 110   Temp 98.7 F (37.1 C) (Oral)   Resp 18   SpO2 100%   Physical Exam Constitutional:      General: He is not in acute distress.    Appearance: Normal appearance. He is well-developed and normal weight. He is not ill-appearing, toxic-appearing or diaphoretic.  HENT:     Head: Normocephalic and atraumatic.     Right Ear: External ear normal.     Left Ear: External ear normal.     Nose: Nose normal.     Mouth/Throat:     Pharynx: Oropharynx is clear.  Eyes:     General: No scleral icterus.       Right eye: No discharge.        Left eye: No discharge.  Extraocular Movements: Extraocular movements intact.     Pupils: Pupils are equal, round, and reactive to light.  Cardiovascular:     Rate and Rhythm: Normal rate.  Pulmonary:     Effort: Pulmonary effort is normal.  Musculoskeletal:     Cervical back: Normal range of motion.     Lumbar back: Spasms and tenderness (Mild over lower back as depicted) present. No swelling, edema, deformity, signs of trauma, lacerations or bony tenderness. Normal range of motion. Negative right straight leg raise test and negative left straight leg raise test. No scoliosis.       Back:  Skin:    General: Skin is warm and dry.  Neurological:     Mental Status: He is alert and oriented to person, place, and time.     Motor: No weakness.     Coordination: Coordination normal.     Gait: Gait normal.     Deep Tendon Reflexes: Reflexes normal.  Psychiatric:        Mood and Affect: Mood normal.         Behavior: Behavior normal.        Thought Content: Thought content normal.        Judgment: Judgment normal.       Assessment and Plan :   PDMP not reviewed this encounter.  1. Acute bilateral low back pain without sciatica   2. Strain of lumbar region, initial encounter   3. Muscle spasm of back     Will manage conservatively for back strain with NSAID and muscle relaxant, rest and modification of physical activity.  Anticipatory guidance provided.  Counseled on better hydration, low back stretches.  Note for work provided.  Counseled patient on potential for adverse effects with medications prescribed/recommended today, ER and return-to-clinic precautions discussed, patient verbalized understanding.    Wallis Bamberg, PA-C 10/28/19 1141

## 2019-11-12 ENCOUNTER — Ambulatory Visit: Payer: Managed Care, Other (non HMO) | Admitting: Internal Medicine

## 2020-08-15 ENCOUNTER — Ambulatory Visit: Payer: Managed Care, Other (non HMO) | Admitting: Internal Medicine

## 2020-09-11 ENCOUNTER — Ambulatory Visit: Payer: Self-pay

## 2020-09-11 NOTE — Telephone Encounter (Signed)
Called patient back to review symptoms   Reason for Disposition  [1] Boil > 1/2 inch across (> 12 mm; larger than a marble) AND [2] center is soft or pus colored  Answer Assessment - Initial Assessment Questions 1. APPEARANCE of BOIL: "What does the boil look like?"      Unsure difficult area to see boil 2. LOCATION: "Where is the boil located?"      Left buttocks toward back  3. NUMBER: "How many boils are there?"      One  4. SIZE: "How big is the boil?" (e.g., inches, cm; compare to size of a coin or other object)     Quarter size 5. ONSET: "When did the boil start?"     Did not say  6. PAIN: "Is there any pain?" If Yes, ask: "How bad is the pain?"   (Scale 1-10; or mild, moderate, severe)     moderate 7. FEVER: "Do you have a fever?" If Yes, ask: "What is it, how was it measured, and when did it start?"      no 8. SOURCE: "Have you been around anyone with boils or other Staph infections?" "Have you ever had boils before?"    Had last boil in his twenties 61. OTHER SYMPTOMS: "Do you have any other symptoms?" (e.g., shaking chills, weakness, rash elsewhere on body)     Denies  10. PREGNANCY: "Is there any chance you are pregnant?" "When was your last menstrual period?"       na  Protocols used: Boil (Skin Abscess)-A-AH

## 2020-09-11 NOTE — Telephone Encounter (Signed)
Called patient regarding boil and discomfort. C/o boil on left buttocks toward back approx. A quarter size. Unable to visualized due to location. Reports it hasn't been long since he noticed boil and now he is having discomfort . Last boil he had was in his twenties. Noted some rash under bilateral axillary. Patient reports bumps are small and not like on buttocks. Reports he changed deodorant and noted small bumps under arms. Encouraged patient go to UC due to no appt available with PCP. Patient reports he would like to see PCP and if any available appt open prior to appt next month he would like to schedule. Patient reports he has several things he would like to discuss with PCP that are not urgent. Care advise given. Patient verbalized understanding of care advise and to call back or go to [facility] or ED if symptoms worsen.

## 2020-09-11 NOTE — Telephone Encounter (Signed)
Patient called, no answer, recording subscriber is not taking calls at this time, try your call again later.     Summary: Boil -seeking nurse advice   Reason for CRM: Pt called and reported that he has a boil that is about to rupture, he felt discomfort all through the weekend. It is on his flank.   Best contact: [phone]

## 2020-09-12 ENCOUNTER — Ambulatory Visit (HOSPITAL_COMMUNITY)
Admission: EM | Admit: 2020-09-12 | Discharge: 2020-09-12 | Disposition: A | Discharge status: Home / Self Care | Payer: Managed Care, Other (non HMO) | Attending: Emergency Medicine | Admitting: Emergency Medicine

## 2020-09-12 ENCOUNTER — Encounter (HOSPITAL_COMMUNITY): Payer: Self-pay | Admitting: Emergency Medicine

## 2020-09-12 ENCOUNTER — Other Ambulatory Visit: Payer: Self-pay

## 2020-09-12 ENCOUNTER — Ambulatory Visit: Payer: Self-pay | Admitting: *Deleted

## 2020-09-12 DIAGNOSIS — L0231 Cutaneous abscess of buttock: Secondary | ICD-10-CM

## 2020-09-12 DIAGNOSIS — L243 Irritant contact dermatitis due to cosmetics: Secondary | ICD-10-CM | POA: Diagnosis not present

## 2020-09-12 MED ORDER — DOXYCYCLINE HYCLATE 100 MG PO CAPS: 100.0000 mg | ORAL_CAPSULE | Freq: Two times a day (BID) | ORAL | 0 refills | Status: DC

## 2020-09-12 MED ORDER — HYDROCORTISONE 1 % EX CREA: TOPICAL_CREAM | CUTANEOUS | 0 refills | Status: DC

## 2020-09-12 NOTE — Telephone Encounter (Signed)
Pt went to urgent care.

## 2020-09-12 NOTE — ED Provider Notes (Signed)
[facility]    CSN: [id number] Arrival date & time: 09/12/20  1144      History   Chief Complaint Chief Complaint  Patient presents with   Abscess    HPI Caleb Heath is a 45 y.o. male.   Patient presents with abscess on buttocks for two days. Was at work today and area became so uncomfortable he had to leave. Denies fever, chills, drainage. Has occurred before but was years ago. Has not attempted treatment   Concerned with irritation to armpits after switching deodorant brands. Area had bumps. Has switched back to former brand. Denies itching, drainage. Has not attempted treatment  Past Medical History:  Diagnosis Date   Anxiety    Fx wrist    Hypertension    Migraine    Sciatica    Thyroid disease     Patient Active Problem List   Diagnosis Date Noted   Influenza vaccination declined 02/01/2019   23-polyvalent pneumococcal polysaccharide vaccine declined 10/30/2018   Tobacco dependence 10/30/2018   Noncompliance with medications 10/30/2018   Family history of colon cancer 10/30/2018   Seasonal allergic rhinitis due to pollen 08/25/2017   Abnormal thyroid function test 08/25/2017   Intractable migraine without aura and without status migrainosus 08/25/2017   HTN (hypertension) 09/02/2012   Muscle cramps 09/02/2012    History reviewed. No pertinent surgical history.     Home Medications    Prior to Admission medications   Medication Sig Start Date End Date Taking? Authorizing Provider  doxycycline (VIBRAMYCIN) 100 MG capsule Take 1 capsule (100 mg total) by mouth 2 (two) times daily. 09/12/20  Yes Arty Lantzy, Hansel Starling R, NP  hydrocortisone cream 1 % Apply to affected area 2 times daily 09/12/20  Yes Griffith Santilli R, NP  amLODipine (NORVASC) 2.5 MG tablet Take by mouth.    [provider]  cetirizine (ZYRTEC) 10 MG tablet Take 1 tablet (10 mg total) by mouth daily. 07/13/19   Marcine Matar, MD  fluticasone (FLONASE) 50 MCG/ACT nasal  spray Place 2 sprays into both nostrils daily. 07/13/19   Marcine Matar, MD  ibuprofen (ADVIL) 800 MG tablet Take 1 tablet (800 mg total) by mouth 3 (three) times daily. 07/05/19   Wieters, Hallie C, PA-C  losartan (COZAAR) 50 MG tablet Take 1 tablet (50 mg total) by mouth daily. 07/13/19   Marcine Matar, MD  naproxen (NAPROSYN) 500 MG tablet Take 1 tablet (500 mg total) by mouth 2 (two) times daily with a meal. 10/28/19   Wallis Bamberg, PA-C  NEOMYCIN-POLYMYXIN-HYDROCORTISONE (CORTISPORIN) 1 % SOLN OTIC solution Apply 1-2 drops to toe BID after soaking 08/05/19   Hyatt, Max T, DPM  nicotine polacrilex (NICORETTE) 2 MG gum Take 1 each (2 mg total) by mouth as needed for smoking cessation (Chew no more than 30 pieces/day). 10/30/18   Marcine Matar, MD  tiZANidine (ZANAFLEX) 4 MG tablet Take 1 tablet (4 mg total) by mouth at bedtime. 10/28/19   Wallis Bamberg, PA-C    Family History Family History  Problem Relation Age of Onset   Hypertension Mother    Colon cancer Father 74   Hypertension Maternal Aunt    Heart attack Maternal Grandmother    Diabetes Maternal Grandmother     Social History Social History   Tobacco Use   Smoking status: Every Day    Packs/day: 0.50    Pack years: 0.00    Types: Cigarettes   Smokeless tobacco: Never   Tobacco comments:    .  5 ppd  Substance Use Topics   Alcohol use: Yes    Alcohol/week: 12.0 standard drinks    Types: 12 Cans of beer per week    Comment: weekend   Drug use: No     Allergies   Amoxicillin, Hydrochlorothiazide, and Amlodipine   Review of Systems Review of Systems Defer to HPI    Physical Exam Triage Vital Signs ED Triage Vitals  Enc Vitals Group     BP 09/12/20 1206 (!) 155/103     Pulse Rate 09/12/20 1206 (!) 119     Resp 09/12/20 1206 17     Temp 09/12/20 1206 99 F (37.2 C)     Temp Source 09/12/20 1206 Oral     SpO2 09/12/20 1206 100 %     Weight --      Height --      Head Circumference --      Peak Flow --       Pain Score 09/12/20 1204 4     Pain Loc --      Pain Edu? --      Excl. in GC? --    No data found.  Updated Vital Signs BP (!) 155/103 (BP Location: Left Arm)   Pulse (!) 119   Temp 99 F (37.2 C) (Oral)   Resp 17   SpO2 100%   Visual Acuity Right Eye Distance:   Left Eye Distance:   Bilateral Distance:    Right Eye Near:   Left Eye Near:    Bilateral Near:     Physical Exam Constitutional:      Appearance: Normal appearance. He is normal weight.  Eyes:     Extraocular Movements: Extraocular movements intact.  Skin:    Comments: Small diffuse flesh toned papular rash in bilateral armpits Dime sized abscess in left side of intergluteal cleft, unable to expel drainage from site with manual pressure,    Neurological:     Mental Status: He is alert and oriented to person, place, and time. Mental status is at baseline.  Psychiatric:        Mood and Affect: Mood normal.        Behavior: Behavior normal.        Thought Content: Thought content normal.        Judgment: Judgment normal.     UC Treatments / Results  Labs (all labs ordered are listed, but only abnormal results are displayed) Labs Reviewed - No data to display  EKG   Radiology No results found.  Procedures Procedures (including critical care time)  Medications Ordered in UC Medications - No data to display  Initial Impression / Assessment and Plan / UC Course  I have reviewed the triage vital signs and the nursing notes.  Pertinent labs & imaging results that were available during my care of the patient were reviewed by me and considered in my medical decision making (see chart for details).  Abscess of left buttock Contact dermatitis  Doxycycline 100 mg bid for 7 days Warm compresses to affected area Strict return precautions given for worsening or non healing site Hydrocortisone cream 1% bid prn to affected area  Final Clinical Impressions(s) / UC Diagnoses   Final diagnoses:   Abscess of buttock, left  Irritant contact dermatitis due to cosmetics     Discharge Instructions      Take doxycyline twice a day for 7 days  Can cover area with bandaid to prevent rubbing and irritation  Warm compresses  at least three times a day to help drain  Follow up for fever chills, worsening pain, swelling or non healing area  Can use hydrocortisone cream twice a day after cleansing in armpits for irritation    ED Prescriptions     Medication Sig Dispense Auth. Provider   doxycycline (VIBRAMYCIN) 100 MG capsule Take 1 capsule (100 mg total) by mouth 2 (two) times daily. 14 capsule Karl Knarr R, NP   hydrocortisone cream 1 % Apply to affected area 2 times daily 15 g Jolee Critcher, Elita Boone, NP      PDMP not reviewed this encounter.   Valinda Hoar, [place] 09/12/20 [phone]

## 2020-09-12 NOTE — Discharge Instructions (Addendum)
Take doxycyline twice a day for 7 days  Can cover area with bandaid to prevent rubbing and irritation  Warm compresses at least three times a day to help drain  Follow up for fever chills, worsening pain, swelling or non healing area  Can use hydrocortisone cream twice a day after cleansing in armpits for irritation

## 2020-09-12 NOTE — Telephone Encounter (Signed)
Reason for Disposition  [1] Boil > 1/2 inch across (> 12 mm; larger than a marble) AND [2] center is soft or pus colored  Answer Assessment - Initial Assessment Questions 1. APPEARANCE of BOIL: "What does the boil look like?"      I'm having a lot of pain.   Every time I walk the pain is giving me a bad headache. 2. LOCATION: "Where is the boil located?"      Between my butt cheeks on the left cheek 3. NUMBER: "How many boils are there?"      One  I felt it over the weekend.   4. SIZE: "How big is the boil?" (e.g., inches, cm; compare to size of a coin or other object)     Yesterday it's the size of a quarter 5. ONSET: "When did the boil start?"     Over the weekend 6. PAIN: "Is there any pain?" If Yes, ask: "How bad is the pain?"   (Scale 1-10; or mild, moderate, severe)     Yes very much when I walk 7. FEVER: "Do you have a fever?" If Yes, ask: "What is it, how was it measured, and when did it start?"      I'm not sure 8. SOURCE: "Have you been around anyone with boils or other Staph infections?" "Have you ever had boils before?"     One time before 9. OTHER SYMPTOMS: "Do you have any other symptoms?" (e.g., shaking chills, weakness, rash elsewhere on body)     No 10. PREGNANCY: "Is there any chance you are pregnant?" "When was your last menstrual period?"       N/A  Protocols used: Boil (Skin Abscess)-A-AH

## 2020-09-12 NOTE — Telephone Encounter (Signed)
Pt called in c/o having a painful boil the size of a quarter between his buttocks.   It started over the weekend and is very painful this morning that it's hard to walk at his job.   The next available appt at [facility] was with Dr. Delford Field tomorrow morning so he has decided to go on to the urgent care today.

## 2020-09-12 NOTE — ED Triage Notes (Signed)
Pt presents with abscess on left buttocks xs 1-2 days.

## 2020-09-13 NOTE — Telephone Encounter (Signed)
Pt was seen at the Urgent Care

## 2020-09-13 NOTE — Telephone Encounter (Signed)
Copied from CRM [id number]. Topic: General - Call Back - No Documentation >> Sep 11, 2020  5:19 PM Randol Kern wrote: Reason for CRM: Pt called and reported that he has a boil that is about to rupture, he felt discomfort all through the weekend. It is on his flank.   Best contact: [phone]  [id number] [place], [place] - [address] AT [facility] [address] [place] [address] Phone: [phone] Fax: [phone]

## 2020-10-16 ENCOUNTER — Encounter: Payer: Self-pay | Admitting: Internal Medicine

## 2020-10-16 ENCOUNTER — Other Ambulatory Visit: Payer: Self-pay

## 2020-10-16 ENCOUNTER — Ambulatory Visit: Payer: Managed Care, Other (non HMO) | Attending: Internal Medicine | Admitting: Internal Medicine

## 2020-10-16 DIAGNOSIS — K649 Unspecified hemorrhoids: Secondary | ICD-10-CM

## 2020-10-16 DIAGNOSIS — I10 Essential (primary) hypertension: Secondary | ICD-10-CM | POA: Diagnosis not present

## 2020-10-16 DIAGNOSIS — F17200 Nicotine dependence, unspecified, uncomplicated: Secondary | ICD-10-CM | POA: Diagnosis not present

## 2020-10-16 DIAGNOSIS — Z800 Family history of malignant neoplasm of digestive organs: Secondary | ICD-10-CM | POA: Diagnosis not present

## 2020-10-16 MED ORDER — NICOTINE 21 MG/24HR TD PT24: 21.0000 mg | MEDICATED_PATCH | Freq: Every day | TRANSDERMAL | 1 refills | Status: DC

## 2020-10-16 MED ORDER — LOSARTAN POTASSIUM 50 MG PO TABS: 50.0000 mg | ORAL_TABLET | Freq: Every day | ORAL | 5 refills | Status: DC

## 2020-10-16 NOTE — Progress Notes (Signed)
Virtual Visit via Telephone Note  I connected with Gardiner Fanti on 10/16/2020 at 3:58 p.m by telephone and verified that I am speaking with the correct person using two identifiers  Location: Patient: at work Provider: office  Participants: Myself Patient   I discussed the limitations, risks, security and privacy concerns of performing an evaluation and management service by telephone and the availability of in person appointments. I also discussed with the patient that there may be a patient responsible charge related to this service. The patient expressed understanding and agreed to proceed.   History of Present Illness: Patient with history of HTN, tob dep, migraines, seasonal allergies, fhx of colon CA in father.  Last eval 06/2019.  Would like visit for physical.  HTN:  reports being off BP med for over 1 yr.  Reports his GF checks his BP every night.  Last reading 150/90 last night.  Reports that it has been running in this range.Marland Kitchen  No CP/SOB/HA/dizziness or LE edema.  Limits salt in foods. Last BP med he was on was Cozaar  Tob dep:  smoking 1/2 pk a day.  Feels he needs to quit and ready to do so..  Never got colonoscopy done. Wants this set up.  Father dx in his early 107s.  Also needs to have hemorrhoid removed and wants this done prior to c-scope Outpatient Encounter Medications as of 10/16/2020  Medication Sig   amLODipine (NORVASC) 2.5 MG tablet Take by mouth.   cetirizine (ZYRTEC) 10 MG tablet Take 1 tablet (10 mg total) by mouth daily.   doxycycline (VIBRAMYCIN) 100 MG capsule Take 1 capsule (100 mg total) by mouth 2 (two) times daily.   fluticasone (FLONASE) 50 MCG/ACT nasal spray Place 2 sprays into both nostrils daily.   hydrocortisone cream 1 % Apply to affected area 2 times daily   ibuprofen (ADVIL) 800 MG tablet Take 1 tablet (800 mg total) by mouth 3 (three) times daily.   losartan (COZAAR) 50 MG tablet Take 1 tablet (50 mg total) by mouth daily.   naproxen  (NAPROSYN) 500 MG tablet Take 1 tablet (500 mg total) by mouth 2 (two) times daily with a meal.   NEOMYCIN-POLYMYXIN-HYDROCORTISONE (CORTISPORIN) 1 % SOLN OTIC solution Apply 1-2 drops to toe BID after soaking   nicotine polacrilex (NICORETTE) 2 MG gum Take 1 each (2 mg total) by mouth as needed for smoking cessation (Chew no more than 30 pieces/day).   tiZANidine (ZANAFLEX) 4 MG tablet Take 1 tablet (4 mg total) by mouth at bedtime.   No facility-administered encounter medications on file as of 10/16/2020.      Observations/Objective: No direct observation done as this is a telephone encounter.  Assessment and Plan:  1. Essential hypertension Advised patient that his blood pressure is not at goal if he has been running in the 150s over 90s.  The goal is 130/80 or lower.  I recommend that we restart his blood pressure medication and he is agreeable to doing so.  Also strongly advised that he comes to the lab to have baseline blood test done including check of his kidney function as he has not had this done in a while. - CBC; Future - Comprehensive metabolic panel; Future - Lipid panel; Future - losartan (COZAAR) 50 MG tablet; Take 1 tablet (50 mg total) by mouth daily.  Dispense: 30 tablet; Refill: 5  2. Tobacco dependence Pt is current smoker. Patient advised to quit smoking. He is aware of health risks associated with smoking.  Pt ready to give trail of quitting.   Discussed methods to help quit including quitting cold [place], use of NRT, Chantix and Bupropion.  Pt wanting to try: patches.  I went over how to use the patches using the stepdown approach.  We will start him with a 21 mg patch. _3_ Minutes spent on counseling. F/U: Reassess his progress on subsequent visit  - nicotine (NICODERM CQ - DOSED IN MG/24 HOURS) 21 mg/24hr patch; Place 1 patch (21 mg total) onto the skin daily.  Dispense: 28 patch; Refill: 1  3. Hemorrhoids, unspecified hemorrhoid type - Ambulatory referral to  General Surgery  4. Family history of colon cancer Patient declined me making referral to GI to have his colonoscopy done.  He states that he will deal with this after he has hemorrhoids removed.  Follow Up Instructions: 8 wks for physical   I discussed the assessment and treatment plan with the patient. The patient was provided an opportunity to ask questions and all were answered. The patient agreed with the plan and demonstrated an understanding of the instructions.   The patient was advised to call back or seek an in-person evaluation if the symptoms worsen or if the condition fails to improve as anticipated.  I  Spent 12 minutes on this telephone encounter  Jonah Blue, MD

## 2020-11-28 ENCOUNTER — Ambulatory Visit: Payer: Managed Care, Other (non HMO) | Attending: Internal Medicine | Admitting: Internal Medicine

## 2020-11-28 ENCOUNTER — Encounter: Payer: Self-pay | Admitting: Internal Medicine

## 2020-11-28 ENCOUNTER — Other Ambulatory Visit: Payer: Self-pay

## 2020-11-28 VITALS — BP 160/110 | HR 100 | Resp 16 | Wt 167.2 lb

## 2020-11-28 DIAGNOSIS — Z2821 Immunization not carried out because of patient refusal: Secondary | ICD-10-CM

## 2020-11-28 DIAGNOSIS — F17210 Nicotine dependence, cigarettes, uncomplicated: Secondary | ICD-10-CM

## 2020-11-28 DIAGNOSIS — K029 Dental caries, unspecified: Secondary | ICD-10-CM | POA: Diagnosis not present

## 2020-11-28 DIAGNOSIS — Z800 Family history of malignant neoplasm of digestive organs: Secondary | ICD-10-CM

## 2020-11-28 DIAGNOSIS — K649 Unspecified hemorrhoids: Secondary | ICD-10-CM | POA: Diagnosis not present

## 2020-11-28 DIAGNOSIS — Z0001 Encounter for general adult medical examination with abnormal findings: Secondary | ICD-10-CM

## 2020-11-28 DIAGNOSIS — Z1159 Encounter for screening for other viral diseases: Secondary | ICD-10-CM

## 2020-11-28 DIAGNOSIS — F17200 Nicotine dependence, unspecified, uncomplicated: Secondary | ICD-10-CM

## 2020-11-28 DIAGNOSIS — I10 Essential (primary) hypertension: Secondary | ICD-10-CM | POA: Diagnosis not present

## 2020-11-28 DIAGNOSIS — Z0000 Encounter for general adult medical examination without abnormal findings: Secondary | ICD-10-CM

## 2020-11-28 MED ORDER — LOSARTAN POTASSIUM-HCTZ 50-12.5 MG PO TABS: 1.0000 | ORAL_TABLET | Freq: Every day | ORAL | 6 refills | Status: DC

## 2020-11-28 NOTE — Progress Notes (Signed)
Patient ID: Caleb FantiJames Meschke, male    DOB: 04/03/1975  MRN: [id number]  CC: Annual Exam (Patient has no question or concerns.)   Subjective: Caleb Heath is a 45 y.o. male who presents for annual physical His concerns today include:  Patient with history of HTN, tob dep, migraines, seasonal allergies, fhx of colon CA in father.  HYPERTENSION Currently taking: see medication list.  He was restarted on Losartan on last visit.  He never picked it up.  He feels the Cozaar never worked for him. Med Adherence: []  Yes    [x]  No Medication side effects: []  Yes    [x]  No Adherence with salt restriction: []  Yes    [x]  No Home Monitoring?: [x]  Yes    []  No Monitoring Frequency: most recent reading was 140/90 Home BP results range:  SOB? []  Yes    [x]  No Chest Pain?: []  Yes    [x]  No Leg swelling?: []  Yes    [x]  No Headaches?: [x]  Yes - a few HA last wk    []  No Dizziness? []  Yes    [x]  No Comments:   Tob dep:  patches prescribed on last visit as he indicated a desire to quit.  He never picked up the patches.  Decided he is not ready.  Reports having a lot of stress with work and financial issues  HM:  declines flu and Pneumovax.  Had 2 Pfizer vaccines so far. Does not want booster.  Due for c-scope based on family history of colon cancer in his father at a young age but wants hemorrhoid removed first.  Patient Active Problem List   Diagnosis Date Noted   Influenza vaccination declined 02/01/2019   23-polyvalent pneumococcal polysaccharide vaccine declined 10/30/2018   Tobacco dependence 10/30/2018   Noncompliance with medications 10/30/2018   Family history of colon cancer 10/30/2018   Seasonal allergic rhinitis due to pollen 08/25/2017   Abnormal thyroid function test 08/25/2017   Intractable migraine without aura and without status migrainosus 08/25/2017   HTN (hypertension) 09/02/2012   Muscle cramps 09/02/2012     Current Outpatient Medications on File Prior to Visit   Medication Sig Dispense Refill   cetirizine (ZYRTEC) 10 MG tablet Take 1 tablet (10 mg total) by mouth daily. (Patient not taking: Reported on 11/28/2020) 30 tablet 11   fluticasone (FLONASE) 50 MCG/ACT nasal spray Place 2 sprays into both nostrils daily. (Patient not taking: Reported on 11/28/2020) 16 g 6   ibuprofen (ADVIL) 800 MG tablet Take 1 tablet (800 mg total) by mouth 3 (three) times daily. (Patient not taking: Reported on 11/28/2020) 21 tablet 0   naproxen (NAPROSYN) 500 MG tablet Take 1 tablet (500 mg total) by mouth 2 (two) times daily with a meal. (Patient not taking: Reported on 11/28/2020) 30 tablet 0   NEOMYCIN-POLYMYXIN-HYDROCORTISONE (CORTISPORIN) 1 % SOLN OTIC solution Apply 1-2 drops to toe BID after soaking (Patient not taking: Reported on 11/28/2020) 10 mL 1   nicotine (NICODERM CQ - DOSED IN MG/24 HOURS) 21 mg/24hr patch Place 1 patch (21 mg total) onto the skin daily. (Patient not taking: Reported on 11/28/2020) 28 patch 1   nicotine polacrilex (NICORETTE) 2 MG gum Take 1 each (2 mg total) by mouth as needed for smoking cessation (Chew no more than 30 pieces/day). (Patient not taking: Reported on 11/28/2020) 100 tablet 1   tiZANidine (ZANAFLEX) 4 MG tablet Take 1 tablet (4 mg total) by mouth at bedtime. (Patient not taking: Reported on 11/28/2020) 30 tablet  0   No current facility-administered medications on file prior to visit.    Allergies  Allergen Reactions   Amoxicillin Other (See Comments)    "I had hiccups and CP"   Hydrochlorothiazide    Amlodipine Nausea Only and Other (See Comments)    drowsy    Social History   Socioeconomic History   Marital status: Single    Spouse name: Not on file   Number of children: 4   Years of education: Not on file   Highest education level: Some college, no degree  Occupational History   Occupation: order pulling and shipping -    Comment: [employer] [place]  Tobacco Use   Smoking status: Every Day    Packs/day: 0.50    Types:  Cigarettes   Smokeless tobacco: Never   Tobacco comments:    .5 ppd  Vaping Use   Vaping Use: Never used  Substance and Sexual Activity   Alcohol use: Yes    Alcohol/week: 12.0 standard drinks    Types: 12 Cans of beer per week    Comment: weekend   Drug use: No   Sexual activity: Not on file  Other Topics Concern   Not on file  Social History Narrative   Not on file   Social Determinants of Health   Financial Resource Strain: Not on file  Food Insecurity: Not on file  Transportation Needs: Not on file  Physical Activity: Not on file  Stress: Not on file  Social Connections: Not on file  Intimate Partner Violence: Not on file    Family History  Problem Relation Age of Onset   Hypertension Mother    Cancer Father        colon CA   Colon cancer Father 50   Hypertension Maternal Aunt    Heart attack Maternal Grandmother    Diabetes Maternal Grandmother     Past Surgical History:  Procedure Laterality Date   NO PAST SURGERIES      ROS: Review of Systems  Constitutional:  Negative for appetite change, fatigue and fever.  HENT:  Negative for congestion, dental problem, hearing loss, nosebleeds and sore throat.        Over due for dental routine visit.  He does have dental insurance.  Eyes:  Negative for visual disturbance.       Has not had an eye exam in years  Respiratory:  Negative for cough.   Cardiovascular:  Negative for palpitations.  Gastrointestinal:  Negative for blood in stool and constipation.       Referred to general surgeon on last visit for hemorrhoids.  Patient states he has not received a call as yet.  He states that the hemorrhoids are on the inside and prolapse when he has a bowel movement.  Endocrine: Negative for cold intolerance, heat intolerance and polyuria.  Genitourinary:  Negative for difficulty urinating, penile discharge and testicular pain.  Musculoskeletal:  Negative for arthralgias.  Skin:  Negative for rash.  Neurological:   Negative for dizziness and tremors.  Psychiatric/Behavioral:  Negative for dysphoric mood and suicidal ideas. The patient is not nervous/anxious.    PHYSICAL EXAM: BP (!) 161/114   Pulse 100   Resp 16   Wt 167 lb 3.2 oz (75.8 kg)   SpO2 98%   BMI 21.47 kg/m   Wt Readings from Last 3 Encounters:  11/28/20 167 lb 3.2 oz (75.8 kg)  07/05/19 170 lb (77.1 kg)  10/30/18 168 lb 3.2 oz (76.3 kg)  Repeat 160/110  Physical Exam General appearance - alert, well appearing, middle-aged African-American male and in no distress Mental status - normal mood, behavior, speech, dress, motor activity, and thought processes Eyes - pupils equal and reactive, extraocular eye movements intact.  Positive arcus senilis Nose - normal and patent, no erythema, discharge or polyps Mouth - mucous membranes moist, pharynx normal without lesions.  He has impacted and decayed wisdom tooth in the lower jaw on both sides and the left upper jaw. Neck - supple, no significant adenopathy Lymphatics - no palpable lymphadenopathy, no hepatosplenomegaly Chest - clear to auscultation, no wheezes, rales or rhonchi, symmetric air entry Heart - normal rate, regular rhythm, normal S1, S2, no murmurs, rubs, clicks or gallops Abdomen - soft, nontender, nondistended, no masses or organomegaly Rectal -patient deferred.  He prefers to have this done when he sees the Careers adviser. Neurological - cranial nerves II through XII intact, motor and sensory grossly normal bilaterally Musculoskeletal - no joint tenderness, deformity or swelling Extremities - peripheral pulses normal, no pedal edema, no clubbing or cyanosis Skin -he has tattoos on his arms. Depression screen [facility] 2/9 11/28/2020 10/30/2018 04/10/2018  Decreased Interest 0 0 0  Down, Depressed, Hopeless 0 0 0  PHQ - 2 Score 0 0 0  Altered sleeping - - -  Tired, decreased energy - - -  Change in appetite - - -  Feeling bad or failure about yourself  - - -  Trouble concentrating - - -   Moving slowly or fidgety/restless - - -  Suicidal thoughts - - -  PHQ-9 Score - - -  Difficult doing work/chores - - -   GAD 7 : Generalized Anxiety Score 11/28/2020 10/30/2018 04/10/2018 01/19/2018  Nervous, Anxious, on Edge 0 0 0 1  Control/stop worrying 0 0 0 3  Worry too much - different things 0 0 0 3  Trouble relaxing 0 0 0 3  Restless 0 0 0 2  Easily annoyed or irritable 0 0 0 3  Afraid - awful might happen 0 0 0 3  Total GAD 7 Score 0 0 0 18  Anxiety Difficulty - - - -      CMP Latest Ref Rng & Units 08/18/2018 08/04/2018 01/19/2018  Glucose 65 - 99 mg/dL - 361(W) 66  BUN 6 - 24 mg/dL - 11 5(L)  Creatinine 4.31 - 1.27 mg/dL - 5.40 0.86(P)  Sodium 134 - 144 mmol/L - 138 142  Potassium 3.5 - 5.2 mmol/L - 4.3 4.4  Chloride 96 - 106 mmol/L - 100 97  CO2 20 - 29 mmol/L - 23 22  Calcium 8.7 - 10.2 mg/dL - 61.9 9.9  Total Protein 6.0 - 8.5 g/dL 7.0 - 7.4  Total Bilirubin 0.0 - 1.2 mg/dL 0.4 - 0.4  Alkaline Phos 39 - 117 IU/L 67 - 71  AST 0 - 40 IU/L 44(H) - 86(H)  ALT 0 - 44 IU/L 28 - 65(H)   Lipid Panel     Component Value Date/Time   CHOL 203 (H) 08/18/2018 0959   TRIG 72 08/18/2018 0959   HDL 96 08/18/2018 0959   CHOLHDL 2.1 08/18/2018 0959   CHOLHDL 2.4 08/16/2015 1021   VLDL 13 08/16/2015 1021   LDLCALC 93 08/18/2018 0959    CBC    Component Value Date/Time   WBC 8.2 01/19/2018 1650   WBC 7.7 12/09/2016 0101   RBC 4.91 01/19/2018 1650   RBC 5.02 12/09/2016 0101   HGB 15.3 01/19/2018 1650  HCT 45.7 01/19/2018 1650   PLT 248 01/19/2018 1650   MCV 93 01/19/2018 1650   MCH 31.2 01/19/2018 1650   MCH 32.5 12/09/2016 0101   MCHC 33.5 01/19/2018 1650   MCHC 35.7 12/09/2016 0101   RDW 12.8 01/19/2018 1650   LYMPHSABS 2,065 08/16/2015 1021   MONOABS 590 08/16/2015 1021   EOSABS 59 08/16/2015 1021   BASOSABS 0 08/16/2015 1021    ASSESSMENT AND PLAN: 1. Annual physical exam   2. Essential hypertension Not at goal. He is at increased cardiovascular  risks due to uncontrolled blood pressure.  He feels that the Cozaar does not work well for him.  He was intolerant of Norvasc in the past; it caused nausea.  HCTZ was also listed as intolerance but he does not remember what reaction he had to it.  I recommend giving the Cozaar in combination with the HCTZ.  Patient advised that if he has any adverse reaction including any rash he should let us know so that we can change the medication.  Otherwise I will have him follow-up with our clinical pharmacist in 2 weeks for repeat blood pressure check.  At that time we could repeat a BMP to check the potassium level. - CBC - Comprehensive metabolic panel - Lipid panel - losartan-hydrochlorothiazide (HYZAAR) 50-12.5 MG tablet; Take 1 tablet by mouth daily.  Dispense: 30 tablet; Refill: 6  3. Dental cavities Recommend that he schedule an appointment with a dentist for routine dental care and to have the cavities addressed  4. Tobacco dependence Advised to quit.  Patient feels he is not ready at this time to give a trial of quitting.  I told him that when he is he can go ahead and try the patches as prescribed on last visit.  5. Influenza vaccination declined Recommended.  Patient declined.  6. 23-polyvalent pneumococcal polysaccharide vaccine declined Recommended.  Patient declined.  7. Hemorrhoids, unspecified hemorrhoid type I gave him the phone number to [facility] surgery where we submitted his referral so that he can call and request an appointment.  8. Family history of colon cancer Strongly recommend referral to get his colonoscopy.  However patient deferred stating that he wants to get the hemorrhoids taken care of first.  9.  Hepatitis C screening Patient agreeable to being screened for hepatitis C. Patient was given the opportunity to ask questions.  Patient verbalized understanding of the plan and was able to repeat key elements of the plan.   Orders Placed This Encounter   Procedures   CBC   Comprehensive metabolic panel   Lipid panel     Requested Prescriptions   Signed Prescriptions Disp Refills   losartan-hydrochlorothiazide (HYZAAR) 50-12.5 MG tablet 30 tablet 6    Sig: Take 1 tablet by mouth daily.    Return in about 4 months (around 03/30/2021) for Give appt with [facility] in 2 wks for BP check.  Jonah Blue, MD, FACP

## 2020-11-28 NOTE — Patient Instructions (Addendum)
We referred you to [facility] for your hemorrhoids.  Their contact information is listed below.  You can call them to request appointment and let them know that we have referred you for hemorrhoids. Ph.# [phone] Fax # [phone] address [address] [employer]   Let me know when you are ready to schedule a colonoscopy for your colon cancer screening.  Your blood pressure is not at goal.  We have put you on a combination pill called Losartan/hydrochlorothiazide.  After you have been on the medicine for 2 weeks, you should return for an appointment with our clinical pharmacist to check your blood pressure.  Please let us know if you experience any side effects from the medication.

## 2020-11-28 NOTE — Progress Notes (Signed)
Patient`s BP was high on both arms. 168/113 on right arm and left arm was 161/114

## 2020-11-29 ENCOUNTER — Other Ambulatory Visit: Payer: Self-pay | Admitting: Internal Medicine

## 2020-11-29 LAB — CBC
Hematocrit: 48.2 % (ref 37.5–51.0)
Hemoglobin: 16 g/dL (ref 13.0–17.7)
MCH: 30.5 pg (ref 26.6–33.0)
MCHC: 33.2 g/dL (ref 31.5–35.7)
MCV: 92 fL (ref 79–97)
Platelets: 230 10*3/uL (ref 150–450)
RBC: 5.25 x10E6/uL (ref 4.14–5.80)
RDW: 13.2 % (ref 11.6–15.4)
WBC: 8.1 10*3/uL (ref 3.4–10.8)

## 2020-11-29 LAB — LIPID PANEL
Chol/HDL Ratio: 2.9 ratio (ref 0.0–5.0)
Cholesterol, Total: 196 mg/dL (ref 100–199)
HDL: 68 mg/dL (ref >39)
LDL Chol Calc (NIH): 115 mg/dL — ABNORMAL HIGH (ref 0–99)
Triglycerides: 73 mg/dL (ref 0–149)
VLDL Cholesterol Cal: 13 mg/dL (ref 5–40)

## 2020-11-29 LAB — COMPREHENSIVE METABOLIC PANEL
ALT: 14 IU/L (ref 0–44)
AST: 27 IU/L (ref 0–40)
Albumin/Globulin Ratio: 2.4 — ABNORMAL HIGH (ref 1.2–2.2)
Albumin: 5.2 g/dL — ABNORMAL HIGH (ref 4.0–5.0)
Alkaline Phosphatase: 83 IU/L (ref 44–121)
BUN/Creatinine Ratio: 10 (ref 9–20)
BUN: 9 mg/dL (ref 6–24)
Bilirubin Total: <0.2 mg/dL (ref 0.0–1.2)
CO2: 25 mmol/L (ref 20–29)
Calcium: 10 mg/dL (ref 8.7–10.2)
Chloride: 101 mmol/L (ref 96–106)
Creatinine, Ser: 0.9 mg/dL (ref 0.76–1.27)
Globulin, Total: 2.2 g/dL (ref 1.5–4.5)
Glucose: 86 mg/dL (ref 65–99)
Potassium: 5.2 mmol/L (ref 3.5–5.2)
Sodium: 142 mmol/L (ref 134–144)
Total Protein: 7.4 g/dL (ref 6.0–8.5)
eGFR: 108 mL/min/{1.73_m2} (ref >59)

## 2020-11-29 MED ORDER — PRAVASTATIN SODIUM 10 MG PO TABS: 10.0000 mg | ORAL_TABLET | Freq: Every day | ORAL | 4 refills | Status: DC

## 2020-12-15 ENCOUNTER — Ambulatory Visit: Payer: Managed Care, Other (non HMO) | Admitting: Pharmacist

## 2020-12-29 ENCOUNTER — Ambulatory Visit: Payer: Self-pay | Admitting: General Surgery

## 2020-12-29 NOTE — H&P (Signed)
REFERRING PHYSICIAN:  Marcine Matar, MD   PROVIDER:  Janey Greaser, MD   MRN: [id number] DOB: February 18, 1976 DATE OF ENCOUNTER: 12/29/2020 Subjective  Chief Complaint: Hemorrhoids (New Consultation )     History of Present Illness: Caleb Heath is a 45 y.o. male who is seen today as an office consultation at the request of Dr. Laural Benes for evaluation of Hemorrhoids (New Consultation )  .     Mr. Waddell has a several year history of a prolapsing hemorrhoid.  It comes out most times when he has a bowel movement.  It bleeds occasionally and he has itching.  He does not have significant complaint of constipation.  Usual bowel movements are 2-3 times per week.  The hemorrhoid is starting to bother him more and he would like to consider surgery.   Review of Systems: A complete review of systems was obtained from the patient.  I have reviewed this information and discussed as appropriate with the patient.  See HPI as well for other ROS.   ROS    Medical History: Past Medical History      Past Medical History:  Diagnosis Date   Hypertension             Patient Active Problem List  Diagnosis   23-polyvalent pneumococcal polysaccharide vaccine declined   Influenza vaccination declined   Abnormal thyroid function test   Family history of colon cancer   HTN (hypertension)   Intractable migraine without aura and without status migrainosus   Muscle cramps   Noncompliance with medications   Seasonal allergic rhinitis due to pollen   Tobacco dependence      Past Surgical History  History reviewed. No pertinent surgical history.      Allergies       Allergies  Allergen Reactions   Amoxicillin Other (See Comments)      Other reaction(s): Other (See Comments) "I had hiccups and CP" "I had hiccups and CP"          No current outpatient medications on file prior to visit.    No current facility-administered medications on file prior to visit.      Family  History  History reviewed. No pertinent family history.      Social History        Tobacco Use  Smoking Status Current Every Day Smoker   Packs/day: 0.50  Smokeless Tobacco Never Used      Social History  Social History         Socioeconomic History   Marital status: Single  Tobacco Use   Smoking status: Current Every Day Smoker      Packs/day: 0.50   Smokeless tobacco: Never Used  Vaping Use   Vaping Use: Never used  Substance and Sexual Activity   Alcohol use: Never   Drug use: Never        Objective:       Vitals:    12/29/20 1554  BP: 130/72  Pulse: (!) 113  Temp: 36.7 C (98.1 F)  SpO2: 98%  Weight: 76.9 kg (169 lb 9.6 oz)  Height: 188 cm (6\' 2" )    Body mass index is 21.78 kg/m.   Physical Exam    General appearance - alert, well appearing, and in no distress Chest - clear to auscultation, no wheezes, rales or rhonchi, symmetric air entry Heart - normal rate, regular rhythm, normal S1, S2, no murmurs, rubs, clicks or gallops Abdomen - soft, nontender, nondistended, no masses or organomegaly  Rectal - External anal exam reveals no significant external hemorrhoids, no evidence of infection, digital rectal exam reveals suggestion of possible internal hemorrhoid towards the left side.  Anoscopy was then performed which showed a large hemorrhoid on the left side.  It was too tender on palpation for consideration of rubber band application.  It is also a couple centimeters in size so the size would likely preclude band application as well.   Labs, Imaging and Diagnostic Testing:     Assessment and Plan:  Diagnoses and all orders for this visit:   Internal hemorrhoid, bleeding       This is not amenable to an office treatment.  I have offered internal hemorrhoidectomy as an outpatient surgical procedure.  I discussed the procedure, risks, and benefits.  I also discussed the expected postoperative course.  I reviewed our ERAS protocol.  He is agreeable  and would like to schedule.   No follow-ups on file.   Janey Greaser, MD

## 2021-01-18 ENCOUNTER — Ambulatory Visit: Payer: Self-pay | Admitting: *Deleted

## 2021-01-18 NOTE — Telephone Encounter (Signed)
Reason for Disposition  [1] Face is swollen AND [2] fever  Answer Assessment - Initial Assessment Questions 1. LOCATION: "Which tooth is hurting?"  (e.g., right-side/left-side, upper/lower, front/back)     Pt calling in.   He is c/o a toothache all week.   2. ONSET: "When did the toothache start?"  (e.g., hours, days)      A week ago. 3. SEVERITY: "How bad is the toothache?"  (Scale 1-10; mild, moderate or severe)   - MILD (1-3): doesn't interfere with chewing    - MODERATE (4-7): interferes with chewing, interferes with normal activities, awakens from sleep     - SEVERE (8-10): unable to eat, unable to do any normal activities, excruciating pain        Upper right side in the back.   Severe I can't chew on that side at all. 4. SWELLING: "Is there any visible swelling of your face?"     Yes it's swollen on jaw bone and by lymph node.   It's worse in the mornings the pain.   5. OTHER SYMPTOMS: "Do you have any other symptoms?" (e.g., fever)     No fever.   I've had problems with this before.   I can't tell what tooth it is but I see swelling around my gum and the tooth has a mercury feeling in it. I need a referral to a dentist.     6. PREGNANCY: "Is there any chance you are pregnant?" "When was your last menstrual period?"     N/A  Protocols used: Toothache-A-AH

## 2021-01-18 NOTE — Telephone Encounter (Signed)
Pt called in requesting a dental referral due to a toothache and swelling for the past week in his upper right back side of his mouth. I referred him to [facility] on [address] In [place].   I gave him the address and phone number.   He is going to get off of work now and go on there.  I sent this information to [facility] too.

## 2021-01-31 ENCOUNTER — Telehealth: Payer: Self-pay | Admitting: Internal Medicine

## 2021-01-31 NOTE — Telephone Encounter (Signed)
This was addressed by the RN during a triage call. The RN referred pt to [facility]..

## 2021-01-31 NOTE — Telephone Encounter (Signed)
Copied from CRM [id number]. Topic: Referral - Request for Referral >> Jan 18, 2021  8:58 AM Wyonia Hough E wrote: Has patient seen PCP for this complaint? no *If NO, is insurance requiring patient see PCP for this issue before PCP can refer them? Referral for which specialty: dentistry  Preferred provider/office:  Reason for referral: tooth ache for a week/ may have infection/ pt would like referral to go to a dentist today asap / please advise

## 2021-03-12 ENCOUNTER — Other Ambulatory Visit: Payer: Self-pay

## 2021-03-12 ENCOUNTER — Other Ambulatory Visit: Payer: Self-pay | Admitting: Internal Medicine

## 2021-03-12 ENCOUNTER — Emergency Department (HOSPITAL_COMMUNITY)
Admission: EM | Admit: 2021-03-12 | Discharge: 2021-03-13 | Disposition: A | Discharge status: Home / Self Care | Payer: Managed Care, Other (non HMO) | Attending: Emergency Medicine | Admitting: Emergency Medicine

## 2021-03-12 ENCOUNTER — Encounter (HOSPITAL_COMMUNITY): Payer: Self-pay

## 2021-03-12 DIAGNOSIS — Z79899 Other long term (current) drug therapy: Secondary | ICD-10-CM | POA: Diagnosis not present

## 2021-03-12 DIAGNOSIS — I10 Essential (primary) hypertension: Secondary | ICD-10-CM | POA: Diagnosis not present

## 2021-03-12 DIAGNOSIS — F17210 Nicotine dependence, cigarettes, uncomplicated: Secondary | ICD-10-CM | POA: Insufficient documentation

## 2021-03-12 DIAGNOSIS — R519 Headache, unspecified: Secondary | ICD-10-CM | POA: Diagnosis not present

## 2021-03-12 LAB — URINALYSIS, ROUTINE W REFLEX MICROSCOPIC
Bilirubin Urine: NEGATIVE
Glucose, UA: NEGATIVE mg/dL
Ketones, ur: NEGATIVE mg/dL
Leukocytes,Ua: NEGATIVE
Nitrite: NEGATIVE
Protein, ur: NEGATIVE mg/dL
Specific Gravity, Urine: <1.005 — ABNORMAL LOW (ref 1.005–1.030)
pH: 5.5 (ref 5.0–8.0)

## 2021-03-12 LAB — CBC WITH DIFFERENTIAL/PLATELET
Abs Immature Granulocytes: 0.02 10*3/uL (ref 0.00–0.07)
Basophils Absolute: 0.1 10*3/uL (ref 0.0–0.1)
Basophils Relative: 1 %
Eosinophils Absolute: 0.1 10*3/uL (ref 0.0–0.5)
Eosinophils Relative: 1 %
HCT: 42.4 % (ref 39.0–52.0)
Hemoglobin: 14.4 g/dL (ref 13.0–17.0)
Immature Granulocytes: 0 %
Lymphocytes Relative: 45 %
Lymphs Abs: 3.8 10*3/uL (ref 0.7–4.0)
MCH: 31.4 pg (ref 26.0–34.0)
MCHC: 34 g/dL (ref 30.0–36.0)
MCV: 92.4 fL (ref 80.0–100.0)
Monocytes Absolute: 0.6 10*3/uL (ref 0.1–1.0)
Monocytes Relative: 7 %
Neutro Abs: 3.8 10*3/uL (ref 1.7–7.7)
Neutrophils Relative %: 46 %
Platelets: 261 10*3/uL (ref 150–400)
RBC: 4.59 MIL/uL (ref 4.22–5.81)
RDW: 13.7 % (ref 11.5–15.5)
WBC: 8.4 10*3/uL (ref 4.0–10.5)
nRBC: 0 % (ref 0.0–0.2)

## 2021-03-12 LAB — BASIC METABOLIC PANEL
Anion gap: 13 (ref 5–15)
BUN: 7 mg/dL (ref 6–20)
CO2: 22 mmol/L (ref 22–32)
Calcium: 9.2 mg/dL (ref 8.9–10.3)
Chloride: 103 mmol/L (ref 98–111)
Creatinine, Ser: 0.79 mg/dL (ref 0.61–1.24)
GFR, Estimated: >60 mL/min (ref >60)
Glucose, Bld: 102 mg/dL — ABNORMAL HIGH (ref 70–99)
Potassium: 3.6 mmol/L (ref 3.5–5.1)
Sodium: 138 mmol/L (ref 135–145)

## 2021-03-12 LAB — URINALYSIS, MICROSCOPIC (REFLEX): Bacteria, UA: NONE SEEN

## 2021-03-12 MED ORDER — SODIUM CHLORIDE 0.9 % IV BOLUS
1000.0000 mL | Freq: Once | INTRAVENOUS | Status: AC
  Administered 2021-03-12: 1000 mL via INTRAVENOUS

## 2021-03-12 MED ORDER — DIPHENHYDRAMINE HCL 50 MG/ML IJ SOLN
25.0000 mg | Freq: Once | INTRAMUSCULAR | Status: AC
  Administered 2021-03-12: 25 mg via INTRAVENOUS
  Filled 2021-03-12: qty 1

## 2021-03-12 MED ORDER — KETOROLAC TROMETHAMINE 30 MG/ML IJ SOLN
30.0000 mg | Freq: Once | INTRAMUSCULAR | Status: AC
  Administered 2021-03-12: 30 mg via INTRAVENOUS
  Filled 2021-03-12: qty 1

## 2021-03-12 MED ORDER — METOCLOPRAMIDE HCL 5 MG/ML IJ SOLN
10.0000 mg | Freq: Once | INTRAMUSCULAR | Status: AC
  Administered 2021-03-12: 10 mg via INTRAVENOUS
  Filled 2021-03-12: qty 2

## 2021-03-12 MED ORDER — ACETAMINOPHEN 500 MG PO TABS
1000.0000 mg | ORAL_TABLET | Freq: Once | ORAL | Status: AC
  Administered 2021-03-12: 17:00:00 1000 mg via ORAL
  Filled 2021-03-12: qty 2

## 2021-03-12 NOTE — ED Notes (Signed)
Pt called 2x no answer 

## 2021-03-12 NOTE — Telephone Encounter (Signed)
Medication: losartan-hydrochlorothiazide (HYZAAR) 50-12.5 MG tablet [[id number]]   Has the patient contacted their pharmacy? Yes- Advised to contact the office as patient is changing pharmacy (Agent: If no, request that the patient contact the pharmacy for the refill. If patient does not wish to contact the pharmacy document the reason why and proceed with request.) (Agent: If yes, when and what did the pharmacy advise?)    Preferred Pharmacy (with phone number or street name): [facility] [address] [place] [address] Phone: [phone] Fax: [phone] Hours: M-F 8:30a-5:30p Has the patient been seen for an appointment in the last year OR does the patient have an upcoming appointment? YES 03/29/21  Agent: Please be advised that RX refills may take up to 3 business days. We ask that you follow-up with your pharmacy.

## 2021-03-12 NOTE — ED Triage Notes (Signed)
Pt arrived POV from home c/o a headache. Pt states he has been out pf his BP medicine for some time and thought he was fine, but today has a headache and this morning with blurred vision that has resolved.

## 2021-03-12 NOTE — ED Provider Notes (Signed)
[facility] EMERGENCY DEPARTMENT Provider Note   CSN: [id number] Arrival date & time: 03/12/21  1549     History No chief complaint on file.   Caleb Heath is a 45 y.o. male.  Patient presents to the emergency department with a chief complaint of headache.  He reports history of migraine headaches, and states this feels similar.  States the headache started earlier today and gradually worsened throughout the day.  States that he did have some blurry vision earlier, but states that that has resolved as the headache is gone down.  He states that the headache was a 10 out of 10 at its worst, now is a 5 out of 10.  He denies any fever or chills.  Denies any recent illnesses.  He states he tried taking naproxen and Tylenol with some mild relief of his symptoms.  He denies any numbness, weakness, or tingling of his extremities.  The history is provided by the patient. No language interpreter was used.      Past Medical History:  Diagnosis Date   Anxiety    Fx wrist    Hypertension    Migraine    Sciatica    Thyroid disease     Patient Active Problem List   Diagnosis Date Noted   Influenza vaccination declined 02/01/2019   23-polyvalent pneumococcal polysaccharide vaccine declined 10/30/2018   Tobacco dependence 10/30/2018   Noncompliance with medications 10/30/2018   Family history of colon cancer 10/30/2018   Seasonal allergic rhinitis due to pollen 08/25/2017   Abnormal thyroid function test 08/25/2017   Intractable migraine without aura and without status migrainosus 08/25/2017   HTN (hypertension) 09/02/2012   Muscle cramps 09/02/2012    Past Surgical History:  Procedure Laterality Date   NO PAST SURGERIES         Family History  Problem Relation Age of Onset   Hypertension Mother    Cancer Father        colon CA   Colon cancer Father 38   Hypertension Maternal Aunt    Heart attack Maternal Grandmother    Diabetes Maternal Grandmother      Social History   Tobacco Use   Smoking status: Every Day    Packs/day: 0.50    Types: Cigarettes   Smokeless tobacco: Never   Tobacco comments:    .5 ppd  Vaping Use   Vaping Use: Never used  Substance Use Topics   Alcohol use: Yes    Alcohol/week: 12.0 standard drinks    Types: 12 Cans of beer per week    Comment: weekend   Drug use: No    Home Medications Prior to Admission medications   Medication Sig Start Date End Date Taking? Authorizing Provider  cetirizine (ZYRTEC) 10 MG tablet Take 1 tablet (10 mg total) by mouth daily. Patient not taking: Reported on 11/28/2020 07/13/19   Marcine Matar, MD  fluticasone [facility]) 50 MCG/ACT nasal spray Place 2 sprays into both nostrils daily. Patient not taking: Reported on 11/28/2020 07/13/19   Marcine Matar, MD  ibuprofen (ADVIL) 800 MG tablet Take 1 tablet (800 mg total) by mouth 3 (three) times daily. Patient not taking: Reported on 11/28/2020 07/05/19   Wieters, Junius Creamer, PA-C  losartan-hydrochlorothiazide (HYZAAR) 50-12.5 MG tablet Take 1 tablet by mouth daily. 11/28/20   Marcine Matar, MD  naproxen (NAPROSYN) 500 MG tablet Take 1 tablet (500 mg total) by mouth 2 (two) times daily with a meal. Patient not taking: Reported  on 11/28/2020 10/28/19   Wallis Bamberg, PA-C  NEOMYCIN-POLYMYXIN-HYDROCORTISONE (CORTISPORIN) 1 % SOLN OTIC solution Apply 1-2 drops to toe BID after soaking Patient not taking: Reported on 11/28/2020 08/05/19   Hyatt, Max T, DPM  nicotine (NICODERM CQ - DOSED IN MG/24 HOURS) 21 mg/24hr patch Place 1 patch (21 mg total) onto the skin daily. Patient not taking: Reported on 11/28/2020 10/16/20   Marcine Matar, MD  nicotine polacrilex (NICORETTE) 2 MG gum Take 1 each (2 mg total) by mouth as needed for smoking cessation (Chew no more than 30 pieces/day). Patient not taking: Reported on 11/28/2020 10/30/18   Marcine Matar, MD  pravastatin (PRAVACHOL) 10 MG tablet Take 1 tablet (10 mg total) by mouth daily. 11/29/20    Marcine Matar, MD  tiZANidine (ZANAFLEX) 4 MG tablet Take 1 tablet (4 mg total) by mouth at bedtime. Patient not taking: Reported on 11/28/2020 10/28/19   Wallis Bamberg, PA-C    Allergies    Amoxicillin, Hydrochlorothiazide, and Amlodipine  Review of Systems   Review of Systems  All other systems reviewed and are negative.  Physical Exam Updated Vital Signs BP (!) 160/109 (BP Location: Right Arm)    Pulse 100    Temp 98.6 F (37 C) (Oral)    Resp 18    Ht 6\' 2"  (1.88 m)    Wt 77.1 kg    SpO2 100%    BMI 21.83 kg/m   Physical Exam Vitals and nursing note reviewed.  Constitutional:      General: He is not in acute distress.    Appearance: He is well-developed.  HENT:     Head: Normocephalic and atraumatic.  Eyes:     Conjunctiva/sclera: Conjunctivae normal.  Cardiovascular:     Rate and Rhythm: Normal rate and regular rhythm.     Heart sounds: No murmur heard. Pulmonary:     Effort: Pulmonary effort is normal. No respiratory distress.     Breath sounds: Normal breath sounds.  Abdominal:     Palpations: Abdomen is soft.     Tenderness: There is no abdominal tenderness.  Musculoskeletal:        General: No swelling.     Cervical back: Neck supple.     Comments: Moves all extremities  Skin:    General: Skin is warm and dry.     Capillary Refill: Capillary refill takes less than 2 seconds.  Neurological:     Mental Status: He is alert.     Comments: CN III-XII intact, speech is clear, movements are goal oriented  Psychiatric:        Mood and Affect: Mood normal.    ED Results / Procedures / Treatments   Labs (all labs ordered are listed, but only abnormal results are displayed) Labs Reviewed  BASIC METABOLIC PANEL - Abnormal; Notable for the following components:      Result Value   Glucose, Bld 102 (*)    All other components within normal limits  URINALYSIS, ROUTINE W REFLEX MICROSCOPIC - Abnormal; Notable for the following components:   Specific Gravity, Urine  <1.005 (*)    Hgb urine dipstick SMALL (*)    All other components within normal limits  CBC WITH DIFFERENTIAL/PLATELET  URINALYSIS, MICROSCOPIC (REFLEX)    EKG None  Radiology No results found.  Procedures Procedures   Medications Ordered in ED Medications  acetaminophen (TYLENOL) tablet 1,000 mg (1,000 mg Oral Given 03/12/21 1711)  ketorolac (TORADOL) 30 MG/ML injection 30 mg (30 mg Intravenous  Given 03/12/21 2333)  metoCLOPramide (REGLAN) injection 10 mg (10 mg Intravenous Given 03/12/21 2333)  diphenhydrAMINE (BENADRYL) injection 25 mg (25 mg Intravenous Given 03/12/21 2333)  sodium chloride 0.9 % bolus 1,000 mL (1,000 mLs Intravenous New Bag/Given 03/12/21 2332)    ED Course  I have reviewed the triage vital signs and the nursing notes.  Pertinent labs & imaging results that were available during my care of the patient were reviewed by me and considered in my medical decision making (see chart for details).    MDM Rules/Calculators/A&P                         Pt HA treated and improved while in ED.  Presentation is like pts typical HA and non concerning for [facility], ICH, Meningitis, or temporal arteritis. Pt is afebrile with no focal neuro deficits, nuchal rigidity, or change in vision. Pt is to follow up with PCP to discuss prophylactic medication. Pt verbalizes understanding and is agreeable with plan to dc.      Final Clinical Impression(s) / ED Diagnoses Final diagnoses:  Nonintractable headache, unspecified chronicity pattern, unspecified headache type    Rx / DC Orders ED Discharge Orders     None        Roxy Horseman, PA-C 03/13/21 0033    Shon Baton, MD 03/13/21 [phone]

## 2021-03-12 NOTE — ED Provider Notes (Signed)
Emergency Medicine Provider Triage Evaluation Note  Caleb Heath , a 45 y.o. male  was evaluated in triage.  Pt complains of headache and blurred vision.  Rates his pain an 8 out of 10.  He took naproxen earlier today without relief.  Patient has not been taking his blood pressure medication for several months because it was not working.  He was only on one agent.  He had some intermittent blurry vision earlier today which has completely resolved.  He has no other neurologic complaints, no chest pain..  Review of Systems  Positive: Headache  Negative: WEAKNESS  Physical Exam  BP (!) 175/95 (BP Location: Right Arm)    Pulse (!) 108    Temp 98.7 F (37.1 C) (Oral)    Resp 18    Ht 6\' 2"  (1.88 m)    Wt 77.1 kg    SpO2 98%    BMI 21.83 kg/m  Gen:   Awake, no distress   Resp:  Normal effort  MSK:   Moves extremities without difficulty  Other:  No obvious deficits  Medical Decision Making  Medically screening exam initiated at 5:06 PM.  Appropriate orders placed.  Caleb Heath was informed that the remainder of the evaluation will be completed by another provider, this initial triage assessment does not replace that evaluation, and the importance of remaining in the ED until their evaluation is complete.  Work-up initiated   Gardiner Fanti, PA-C 03/12/21 1708    03/14/21, MD 03/12/21 [phone]

## 2021-03-13 NOTE — Telephone Encounter (Signed)
Requested medication (s) are due for refill today: NO  Requested medication (s) are on the active medication list: Yes  Last refill: 11/28/20 30 tablets 6  refills  Future visit scheduled: Hendricks Limes  Notes to clinic: Medication refused due to request to soon.

## 2021-03-13 NOTE — ED Notes (Signed)
Pt NAD, a/ox4. Pt verbalizes understanding of all DC and f/u instructions. All questions answered. Pt walks with steady gait to lobby at DC.  ? ?

## 2021-03-29 ENCOUNTER — Ambulatory Visit: Payer: Managed Care, Other (non HMO) | Admitting: Internal Medicine

## 2021-05-18 ENCOUNTER — Telehealth: Payer: Managed Care, Other (non HMO) | Admitting: Internal Medicine

## 2021-05-23 ENCOUNTER — Telehealth: Payer: Managed Care, Other (non HMO) | Admitting: Internal Medicine

## 2021-06-26 ENCOUNTER — Ambulatory Visit (HOSPITAL_BASED_OUTPATIENT_CLINIC_OR_DEPARTMENT_OTHER): Payer: Managed Care, Other (non HMO) | Admitting: Internal Medicine

## 2021-06-26 DIAGNOSIS — Z538 Procedure and treatment not carried out for other reasons: Secondary | ICD-10-CM

## 2021-06-27 NOTE — Progress Notes (Signed)
Appt cancel.  Pt was called by CMA at time of telephone visit.  Pt requested to be rescheduled in person.  Appt given for next mth. ?

## 2021-07-06 ENCOUNTER — Encounter: Payer: Self-pay | Admitting: Internal Medicine

## 2021-07-06 ENCOUNTER — Ambulatory Visit: Payer: Managed Care, Other (non HMO) | Attending: Internal Medicine | Admitting: Internal Medicine

## 2021-07-06 ENCOUNTER — Other Ambulatory Visit: Payer: Self-pay

## 2021-07-06 VITALS — BP 152/110 | HR 97 | Resp 16 | Ht 74.0 in | Wt 175.0 lb

## 2021-07-06 DIAGNOSIS — F17200 Nicotine dependence, unspecified, uncomplicated: Secondary | ICD-10-CM

## 2021-07-06 DIAGNOSIS — Z800 Family history of malignant neoplasm of digestive organs: Secondary | ICD-10-CM | POA: Diagnosis not present

## 2021-07-06 DIAGNOSIS — I10 Essential (primary) hypertension: Secondary | ICD-10-CM | POA: Diagnosis not present

## 2021-07-06 DIAGNOSIS — E785 Hyperlipidemia, unspecified: Secondary | ICD-10-CM | POA: Diagnosis not present

## 2021-07-06 MED ORDER — PRAVASTATIN SODIUM 10 MG PO TABS
10.0000 mg | ORAL_TABLET | Freq: Every day | ORAL | 4 refills | Status: DC
  Filled 2021-07-06: qty 30, 30d supply, fill #0

## 2021-07-06 MED ORDER — LOSARTAN POTASSIUM-HCTZ 50-12.5 MG PO TABS: 1.0000 | ORAL_TABLET | Freq: Every day | ORAL | 6 refills | Status: DC

## 2021-07-06 MED ORDER — PRAVASTATIN SODIUM 10 MG PO TABS: 10.0000 mg | ORAL_TABLET | Freq: Every day | ORAL | 4 refills | Status: DC

## 2021-07-06 MED ORDER — LOSARTAN POTASSIUM-HCTZ 50-12.5 MG PO TABS
1.0000 | ORAL_TABLET | Freq: Every day | ORAL | 6 refills | Status: DC
  Filled 2021-07-06: qty 30, 30d supply, fill #0

## 2021-07-06 NOTE — Progress Notes (Signed)
? ? ?Patient ID: Caleb Heath, male    DOB: 05-02-1975  MRN: [id number] ? ?CC: Hypertension ? ? ?Subjective: ?Caleb Heath is a 46 y.o. male who presents for chronic ds management ?His concerns today include:  ?Patient with history of HTN, tob dep, migraines, seasonal allergies, fhx of colon CA in father. ? ?HYPERTENSION ?Currently taking: see medication list.  Placed on Losartan/HCTZ on last visit ?Med Adherence: []  Yes    [x]  No.  Patient states he does not know why he is not adherent with taking the medication.  States he had a lot going on.  He feels he needs to do better. ?Medication side effects: []  Yes    [x]  No ?Adherence with salt restriction: [x]  Yes    []  No ?Home Monitoring?: [x]  Yes only when he does not feel well ?Monitoring Frequency:  ?Home BP results range:  ?SOB? []  Yes    [x]  No ?Chest Pain?: []  Yes    [x]  No ?Leg swelling?: []  Yes    [x]  No ?Headaches?: []  Yes    [x]  No ?Dizziness? []  Yes    [x]  No ?Comments:  ? ?HL:  LDL was 115 with ASCVD score of 14%.  Rec starting Pravachol.  He did review my lab result message and recommendation but never picked up the medication.  He is agreeable to starting the medication. ? ?Referred to general surgeon on last visit for hemorrhoids.  Saw surgeon but his co-pay is too high to proceed with surgical intervention..  Fhx of early colon CA in father in his 69's.  I have recommended colonoscopy for this but patient had put it off stating he wanted to have the hemorrhoids taken care of first.  Since he is not able to afford the intervention for the hemorrhoids, he is agreeable now to referral for colonoscopy given his family history. ? ?Tob dep: not smoking as much as he use to.  Smoked for 30 yrs ? ?Reports increase gas in stomach and chest.  Taking Beno and Gas-X which helps some but expense.  ?Patient Active Problem List  ? Diagnosis Date Noted  ? Influenza vaccination declined 02/01/2019  ? 23-polyvalent pneumococcal polysaccharide vaccine declined  10/30/2018  ? Tobacco dependence 10/30/2018  ? Noncompliance with medications 10/30/2018  ? Family history of colon cancer 10/30/2018  ? Seasonal allergic rhinitis due to pollen 08/25/2017  ? Abnormal thyroid function test 08/25/2017  ? Intractable migraine without aura and without status migrainosus 08/25/2017  ? HTN (hypertension) 09/02/2012  ? Muscle cramps 09/02/2012  ?  ? ?No current outpatient medications on file prior to visit.  ? ?No current facility-administered medications on file prior to visit.  ? ? ?Allergies  ?Allergen Reactions  ? Amoxicillin Other (See Comments)  ?  "I had hiccups and CP"  ? Hydrochlorothiazide   ? Amlodipine Nausea Only and Other (See Comments)  ?  drowsy  ? ? ?Social History  ? ?Socioeconomic History  ? Marital status: Single  ?  Spouse name: Not on file  ? Number of children: 4  ? Years of education: Not on file  ? Highest education level: Some college, no degree  ?Occupational History  ? Occupation: order pulling and shipping -  ?  Comment: [employer]  ?Tobacco Use  ? Smoking status: Every Day  ?  Packs/day: 0.50  ?  Types: Cigarettes  ? Smokeless tobacco: Never  ? Tobacco comments:  ?  .5 ppd  ?Vaping Use  ? Vaping Use: Never used  ?  Substance and Sexual Activity  ? Alcohol use: Yes  ?  Alcohol/week: 12.0 standard drinks  ?  Types: 12 Cans of beer per week  ?  Comment: weekend  ? Drug use: No  ? Sexual activity: Not on file  ?Other Topics Concern  ? Not on file  ?Social History Narrative  ? Not on file  ? ?Social Determinants of Health  ? ?Financial Resource Strain: Not on file  ?Food Insecurity: Not on file  ?Transportation Needs: Not on file  ?Physical Activity: Not on file  ?Stress: Not on file  ?Social Connections: Not on file  ?Intimate Partner Violence: Not on file  ? ? ?Family History  ?Problem Relation Age of Onset  ? Hypertension Mother   ? Cancer Father   ?     colon CA  ? Colon cancer Father [id number]  ? Hypertension Maternal Aunt   ? Heart attack Maternal  Grandmother   ? Diabetes Maternal Grandmother   ? ? ?Past Surgical History:  ?Procedure Laterality Date  ? NO PAST SURGERIES    ? ? ?ROS: ?Review of Systems ?Negative except as stated above ? ?PHYSICAL EXAM: ?BP (!) 161/108   Pulse 97   Resp 16   Ht 6\' 2"  (1.88 m)   Wt 175 lb (79.4 kg)   SpO2 97%   BMI 22.47 kg/m?   ?Wt Readings from Last 3 Encounters:  ?07/06/21 175 lb (79.4 kg)  ?03/12/21 170 lb (77.1 kg)  ?11/28/20 167 lb 3.2 oz (75.8 kg)  ? ? ?Physical Exam ? ? ?General appearance - alert, well appearing, middle-age African-American male and in no distress ?Mental status - normal mood, behavior, speech, dress, motor activity, and thought processes ?Chest - clear to auscultation, no wheezes, rales or rhonchi, symmetric air entry ?Heart - normal rate, regular rhythm, normal S1, S2, no murmurs, rubs, clicks or gallops ?Extremities - peripheral pulses normal, no pedal edema, no clubbing or cyanosis ? ? ?  Latest Ref Rng & Units 03/12/2021  ?  5:07 PM 11/28/2020  ? 11:26 AM 08/18/2018  ?  9:59 AM  ?CMP  ?Glucose 70 - 99 mg/dL 098102   86     ?BUN 6 - 20 mg/dL 7   9     ?Creatinine 0.61 - 1.24 mg/dL 1.190.79   1.470.90     ?Sodium 135 - 145 mmol/L 138   142     ?Potassium 3.5 - 5.1 mmol/L 3.6   5.2     ?Chloride 98 - 111 mmol/L 103   101     ?CO2 22 - 32 mmol/L 22   25     ?Calcium 8.9 - 10.3 mg/dL 9.2   82.910.0     ?Total Protein 6.0 - 8.5 g/dL  7.4   7.0    ?Total Bilirubin 0.0 - 1.2 mg/dL  <5.6<0.2   0.4    ?Alkaline Phos 44 - 121 IU/L  83   67    ?AST 0 - 40 IU/L  27   44    ?ALT 0 - 44 IU/L  14   28    ? ?Lipid Panel  ?   ?Component Value Date/Time  ? CHOL 196 11/28/2020 1126  ? TRIG 73 11/28/2020 1126  ? HDL 68 11/28/2020 1126  ? CHOLHDL 2.9 11/28/2020 1126  ? CHOLHDL 2.4 08/16/2015 1021  ? VLDL 13 08/16/2015 1021  ? LDLCALC 115 (H) 11/28/2020 1126  ? ? ?CBC ?   ?Component Value Date/Time  ? WBC  8.4 03/12/2021 1707  ? RBC 4.59 03/12/2021 1707  ? HGB 14.4 03/12/2021 1707  ? HGB 16.0 11/28/2020 1126  ? HCT 42.4 03/12/2021 1707   ? HCT 48.2 11/28/2020 1126  ? PLT 261 03/12/2021 1707  ? PLT 230 11/28/2020 1126  ? MCV 92.4 03/12/2021 1707  ? MCV 92 11/28/2020 1126  ? MCH 31.4 03/12/2021 1707  ? MCHC 34.0 03/12/2021 1707  ? RDW 13.7 03/12/2021 1707  ? RDW 13.2 11/28/2020 1126  ? LYMPHSABS 3.8 03/12/2021 1707  ? MONOABS 0.6 03/12/2021 1707  ? EOSABS 0.1 03/12/2021 1707  ? BASOSABS 0.1 03/12/2021 1707  ? ? ?ASSESSMENT AND PLAN: ?1. Essential hypertension ?Not at goal.  Patient noncompliant with taking the medication.  He does not identify any specific barriers other than to state that he had a lot going on in the past several months.  Discussed health risks associated with uncontrolled blood pressure including cardiovascular events and adverse effects on the kidneys.  He is agreeable to taking the medication and requested that a refill be sent to his pharmacy. ?-Follow-up with clinical pharmacist in 2 weeks for blood pressure check. ?- losartan-hydrochlorothiazide (HYZAAR) 50-12.5 MG tablet; Take 1 tablet by mouth daily.  Dispense: 30 tablet; Refill: 6 ? ?2. Tobacco dependence ?Advised to quit.  He is aware of the health risks associated with smoking.  Not ready to give a trial of quitting. ? ?3. Hyperlipidemia, unspecified hyperlipidemia type ?Discussed that he is at increased risk for heart attack and strokes given his history of hypertension, tobacco dependence and now also hyperlipidemia.  Patient agreeable to taking the Pravachol.  LFTs were checked on last visit and were normal. ?- pravastatin (PRAVACHOL) 10 MG tablet; Take 1 tablet (10 mg total) by mouth daily.  Dispense: 30 tablet; Refill: 4 ? ?4. Family history of colon cancer ?- Ambulatory referral to Gastroenterology ? ? ? ?Patient was given the opportunity to ask questions.  Patient verbalized understanding of the plan and was able to repeat key elements of the plan.  ? ?This documentation was completed using Paediatric nurse.  Any transcriptional errors are  unintentional. ? ?Orders Placed This Encounter  ?Procedures  ? Ambulatory referral to Gastroenterology  ? ? ? ?Requested Prescriptions  ? ?Signed Prescriptions Disp Refills  ? losartan-hydrochlorothiazide (HYZAAR) 50-12.5 MG tabl

## 2021-07-06 NOTE — Patient Instructions (Signed)
I encourage you to take your blood pressure medication and the cholesterol lowering medication Pravachol as prescribed.  ?

## 2021-07-30 ENCOUNTER — Ambulatory Visit: Payer: Managed Care, Other (non HMO) | Admitting: Pharmacist

## 2021-11-13 ENCOUNTER — Other Ambulatory Visit: Payer: Self-pay

## 2021-11-13 ENCOUNTER — Encounter: Payer: Self-pay | Admitting: Internal Medicine

## 2021-11-13 ENCOUNTER — Encounter: Payer: Self-pay | Admitting: Emergency Medicine

## 2021-11-13 ENCOUNTER — Ambulatory Visit: Payer: Managed Care, Other (non HMO) | Attending: Internal Medicine | Admitting: Internal Medicine

## 2021-11-13 VITALS — BP 145/99 | HR 112 | Temp 98.5°F | Ht 74.0 in | Wt 168.2 lb

## 2021-11-13 DIAGNOSIS — F17200 Nicotine dependence, unspecified, uncomplicated: Secondary | ICD-10-CM

## 2021-11-13 DIAGNOSIS — I10 Essential (primary) hypertension: Secondary | ICD-10-CM

## 2021-11-13 DIAGNOSIS — Z1211 Encounter for screening for malignant neoplasm of colon: Secondary | ICD-10-CM

## 2021-11-13 DIAGNOSIS — Z2821 Immunization not carried out because of patient refusal: Secondary | ICD-10-CM

## 2021-11-13 DIAGNOSIS — Z800 Family history of malignant neoplasm of digestive organs: Secondary | ICD-10-CM

## 2021-11-13 DIAGNOSIS — E785 Hyperlipidemia, unspecified: Secondary | ICD-10-CM | POA: Diagnosis not present

## 2021-11-13 DIAGNOSIS — R1030 Lower abdominal pain, unspecified: Secondary | ICD-10-CM | POA: Diagnosis not present

## 2021-11-13 MED ORDER — OMEPRAZOLE 20 MG PO CPDR
20.0000 mg | DELAYED_RELEASE_CAPSULE | Freq: Every day | ORAL | 3 refills | Status: DC
  Filled 2021-11-13 (×3): qty 30, 30d supply, fill #0

## 2021-11-13 MED ORDER — OMEPRAZOLE 20 MG PO CPDR: 20.0000 mg | DELAYED_RELEASE_CAPSULE | Freq: Every day | ORAL | 3 refills | Status: DC

## 2021-11-13 NOTE — Progress Notes (Signed)
Patient ID: Caleb Heath, male    DOB: Feb 16, 1976  MRN: [id number]  CC: Chronic disease management  Subjective: Caleb Heath is a 46 y.o. male who presents for chronic disease management. His concerns today include:  Patient with history of HTN, tob dep, migraines, seasonal allergies, fhx of colon CA in father.  C/o intermittent lower abdominal pain x few mths, lasting 1-2 days at a time Located below umbilicus but sometimes above it mid abdomin both sides -more of a cramppy pain -some nausea but no vomiting.  No fever or dysuria. He has not noticed any hernia or bulging in the abdomen No associated constipation/diarrhea. No blood in stools.  Last BM was this morning.  No wgh changes.  However I note that he is down about 7 pounds since we last saw him in April.  He tells me that his weight fluctuates whereby he loses weight in the summer months but gains it back in the winter. Not sure if associated with foods.  Has not paid attention.  Yesterday morning the pain was severe.  Night before he had fried chicken, beef, black beans and corn for dinner.  Informs me that he takes Beano and was taking a plant-based medication for acid reflux which he took as needed whenever he had the symptoms with good results.  However the plant-based medication has become too expensive for him to purchase.  He sometimes wakes in the mornings with bitter saliva in his mouth.  Drinks a few 16 ounce beers on the weekends.  May have a beer a few times during the week after work but states he is not a heavy drinker. -still has pain today but not as bad as yesterday.  Yesterday pain was 10/10.  Today it is 3-4/10 -referred to GI on last visit for c-scope due to family hx of colon CA.  Reports being called but he was not able to pay his co-pay. Has a lot of bills  HYPERTENSION Currently taking: see medication list.  Patient is supposed to be on Hyzaar 50/12.5 mg once a day. Med Adherence: []  Yes    [x]  No.  "Reports  he just does not trust it and does not feel the med helps"  Does not check BP Medication side effects: []  Yes    []  No Adherence with salt restriction: [x]  Yes    []  No Home Monitoring?: []  Yes    [x]  No Monitoring Frequency:  Home BP results range:  SOB? []  Yes    [x]  No Chest Pain?: []  Yes    [x]  No Leg swelling?: []  Yes    []  No Headaches?: [x]  Yes  -had a bad HA this a.m.  Took an ASA and went back to sleep.   Dizziness? []  Yes    [x]  No Comments:   Tobacco dependence: still smoking.  Not ready to quit  HL: Supposed to be on Pravachol 10 mg daily but not taking for same reason he is not taking his BP meds.   HM:  Declines flu vaccine.   Patient Active Problem List   Diagnosis Date Noted   Influenza vaccination declined 02/01/2019   23-polyvalent pneumococcal polysaccharide vaccine declined 10/30/2018   Tobacco dependence 10/30/2018   Noncompliance with medications 10/30/2018   Family history of colon cancer 10/30/2018   Seasonal allergic rhinitis due to pollen 08/25/2017   Abnormal thyroid function test 08/25/2017   Intractable migraine without aura and without status migrainosus 08/25/2017   HTN (hypertension) 09/02/2012  Muscle cramps 09/02/2012     Current Outpatient Medications on File Prior to Visit  Medication Sig Dispense Refill   losartan-hydrochlorothiazide (HYZAAR) 50-12.5 MG tablet Take 1 tablet by mouth daily. (Patient not taking: Reported on 11/13/2021) 30 tablet 6   pravastatin (PRAVACHOL) 10 MG tablet Take 1 tablet (10 mg total) by mouth daily. (Patient not taking: Reported on 11/13/2021) 30 tablet 4   No current facility-administered medications on file prior to visit.    Allergies  Allergen Reactions   Amoxicillin Other (See Comments)    "I had hiccups and CP"   Hydrochlorothiazide    Amlodipine Nausea Only and Other (See Comments)    drowsy    Social History   Socioeconomic History   Marital status: Single    Spouse name: Not on file    Number of children: 4   Years of education: Not on file   Highest education level: Some college, no degree  Occupational History   Occupation: order pulling and shipping -    Comment: [employer] [place]  Tobacco Use   Smoking status: Every Day    Packs/day: 0.50    Types: Cigarettes   Smokeless tobacco: Never   Tobacco comments:    .5 ppd  Vaping Use   Vaping Use: Never used  Substance and Sexual Activity   Alcohol use: Yes    Alcohol/week: 12.0 standard drinks of alcohol    Types: 12 Cans of beer per week    Comment: weekend   Drug use: No   Sexual activity: Not on file  Other Topics Concern   Not on file  Social History Narrative   Not on file   Social Determinants of Health   Financial Resource Strain: Not on file  Food Insecurity: Not on file  Transportation Needs: Not on file  Physical Activity: Not on file  Stress: Not on file  Social Connections: Not on file  Intimate Partner Violence: Not on file    Family History  Problem Relation Age of Onset   Hypertension Mother    Cancer Father        colon CA   Colon cancer Father 16   Hypertension Maternal Aunt    Heart attack Maternal Grandmother    Diabetes Maternal Grandmother     Past Surgical History:  Procedure Laterality Date   NO PAST SURGERIES      ROS: Review of Systems Negative except as stated above  PHYSICAL EXAM: BP (!) 145/99   Pulse (!) 112   Temp 98.5 F (36.9 C) (Oral)   Ht 6\' 2"  (1.88 m)   Wt 168 lb 3.2 oz (76.3 kg)   SpO2 98%   BMI 21.60 kg/m   Wt Readings from Last 3 Encounters:  11/13/21 168 lb 3.2 oz (76.3 kg)  07/06/21 175 lb (79.4 kg)  03/12/21 170 lb (77.1 kg)    Physical Exam   General appearance - alert, well appearing, middle-aged African-American male and in no distress Mental status - normal mood, behavior, speech, dress, motor activity, and thought processes Chest - clear to auscultation, no wheezes, rales or rhonchi, symmetric air entry Heart - normal  rate, regular rhythm, normal S1, S2, no murmurs, rubs, clicks or gallops Abdomen -nondistended, bowels mildly hyperactive.  No organomegaly.  Mild tenderness across lower abdomen without guarding or rebound.  Mild epigastric tenderness without guarding or rebound. Extremities - peripheral pulses normal, no pedal edema, no clubbing or cyanosis     Latest Ref Rng & Units  03/12/2021    5:07 PM 11/28/2020   11:26 AM 08/18/2018    9:59 AM  CMP  Glucose 70 - 99 mg/dL 106  86    BUN 6 - 20 mg/dL 7  9    Creatinine 2.69 - 1.24 mg/dL 4.85  4.62    Sodium 703 - 145 mmol/L 138  142    Potassium 3.5 - 5.1 mmol/L 3.6  5.2    Chloride 98 - 111 mmol/L 103  101    CO2 22 - 32 mmol/L 22  25    Calcium 8.9 - 10.3 mg/dL 9.2  50.0    Total Protein 6.0 - 8.5 g/dL  7.4  7.0   Total Bilirubin 0.0 - 1.2 mg/dL  <9.3  0.4   Alkaline Phos 44 - 121 IU/L  83  67   AST 0 - 40 IU/L  27  44   ALT 0 - 44 IU/L  14  28    Lipid Panel     Component Value Date/Time   CHOL 196 11/28/2020 1126   TRIG 73 11/28/2020 1126   HDL 68 11/28/2020 1126   CHOLHDL 2.9 11/28/2020 1126   CHOLHDL 2.4 08/16/2015 1021   VLDL 13 08/16/2015 1021   LDLCALC 115 (H) 11/28/2020 1126    CBC    Component Value Date/Time   WBC 8.4 03/12/2021 1707   RBC 4.59 03/12/2021 1707   HGB 14.4 03/12/2021 1707   HGB 16.0 11/28/2020 1126   HCT 42.4 03/12/2021 1707   HCT 48.2 11/28/2020 1126   PLT 261 03/12/2021 1707   PLT 230 11/28/2020 1126   MCV 92.4 03/12/2021 1707   MCV 92 11/28/2020 1126   MCH 31.4 03/12/2021 1707   MCHC 34.0 03/12/2021 1707   RDW 13.7 03/12/2021 1707   RDW 13.2 11/28/2020 1126   LYMPHSABS 3.8 03/12/2021 1707   MONOABS 0.6 03/12/2021 1707   EOSABS 0.1 03/12/2021 1707   BASOSABS 0.1 03/12/2021 1707    ASSESSMENT AND PLAN: 1. Intermittent lower abdominal pain Of questionable etiology.  Pain has significantly decreased from what it was yesterday morning.  Differential diagnosis can include diverticulitis or  appendicitis but I doubt the latter. No symptoms to suggest UTI.  Advised patient that if pain increases in severity, he should be seen in the emergency room for advanced imaging.  He does have a component of acid reflux so we will start him on omeprazole.  GERD precautions given including foods to avoid, avoiding NSAIDs, eating the last meal at least 2 to 3 hours before laying down at night and sleeping with his head a little elevated. Since the pain has been intermittent over a few months, we will refer to gastroenterology as well. - CBC - Comprehensive metabolic panel - Lipase - omeprazole (PRILOSEC) 20 MG capsule; Take 1 capsule (20 mg total) by mouth daily.  Dispense: 30 capsule; Refill: 3 - Ambulatory referral to Gastroenterology  2. Essential hypertension Not at goal due to noncompliance with medication.  Discussed with him again risks associated with uncontrolled blood pressure.  Not interested in new medication.  I have tried to convince him that the Cozaar/HCTZ is safe to take and have encouraged him to take it.  3. Tobacco dependence Advised to quit.  Patient not ready to give a trial of quitting.  4. Hyperlipidemia, unspecified hyperlipidemia type Informed of health risks associated with uncontrolled cholesterol.  Encouraged him to take the Pravachol.  5. Family history of colon cancer 6. Screening for colon  cancer Given his family history of colon cancer, informed him that it is ideal for him to have colonoscopy done.  However if he is unable to afford a colonoscopy we can do a Cologuard test.  He is agreeable to this. - Cologuard  7. Influenza vaccination declined Recommended.  Patient declined.      Patient was given the opportunity to ask questions.  Patient verbalized understanding of the plan and was able to repeat key elements of the plan.   This documentation was completed using Paediatric nurse.  Any transcriptional errors are  unintentional.  Orders Placed This Encounter  Procedures   Cologuard   CBC   Comprehensive metabolic panel   Lipase   Ambulatory referral to Gastroenterology     Requested Prescriptions   Signed Prescriptions Disp Refills   omeprazole (PRILOSEC) 20 MG capsule 30 capsule 3    Sig: Take 1 capsule (20 mg total) by mouth daily.    Return in about 4 months (around 03/15/2022).  Jonah Blue, MD, FACP

## 2021-11-13 NOTE — Patient Instructions (Signed)
We will check some baseline blood test today in regards to your abdominal pain. Start omeprazole 20 mg daily in the mornings for acid reflux symptoms. If abdominal pain does not resolve or gets worse, you should be seen in the emergency room for advanced imaging study like a CAT scan.

## 2021-11-14 LAB — COMPREHENSIVE METABOLIC PANEL
ALT: 23 IU/L (ref 0–44)
AST: 38 IU/L (ref 0–40)
Albumin/Globulin Ratio: 2.2 (ref 1.2–2.2)
Albumin: 5.2 g/dL — ABNORMAL HIGH (ref 4.1–5.1)
Alkaline Phosphatase: 78 IU/L (ref 44–121)
BUN/Creatinine Ratio: 9 (ref 9–20)
BUN: 8 mg/dL (ref 6–24)
Bilirubin Total: 0.4 mg/dL (ref 0.0–1.2)
CO2: 23 mmol/L (ref 20–29)
Calcium: 10.2 mg/dL (ref 8.7–10.2)
Chloride: 97 mmol/L (ref 96–106)
Creatinine, Ser: 0.91 mg/dL (ref 0.76–1.27)
Globulin, Total: 2.4 g/dL (ref 1.5–4.5)
Glucose: 82 mg/dL (ref 70–99)
Potassium: 4.8 mmol/L (ref 3.5–5.2)
Sodium: 138 mmol/L (ref 134–144)
Total Protein: 7.6 g/dL (ref 6.0–8.5)
eGFR: 106 mL/min/{1.73_m2} (ref >59)

## 2021-11-14 LAB — CBC
Hematocrit: 47.8 % (ref 37.5–51.0)
Hemoglobin: 16.2 g/dL (ref 13.0–17.7)
MCH: 30.6 pg (ref 26.6–33.0)
MCHC: 33.9 g/dL (ref 31.5–35.7)
MCV: 90 fL (ref 79–97)
Platelets: 267 10*3/uL (ref 150–450)
RBC: 5.29 x10E6/uL (ref 4.14–5.80)
RDW: 13 % (ref 11.6–15.4)
WBC: 7.4 10*3/uL (ref 3.4–10.8)

## 2021-11-14 LAB — LIPASE: Lipase: 29 U/L (ref 13–78)

## 2021-11-20 ENCOUNTER — Other Ambulatory Visit: Payer: Self-pay

## 2022-03-04 ENCOUNTER — Encounter (HOSPITAL_COMMUNITY): Payer: Self-pay

## 2022-03-04 ENCOUNTER — Ambulatory Visit (HOSPITAL_COMMUNITY)
Admission: EM | Admit: 2022-03-04 | Discharge: 2022-03-04 | Disposition: A | Discharge status: Home / Self Care | Payer: Managed Care, Other (non HMO) | Attending: Emergency Medicine | Admitting: Emergency Medicine

## 2022-03-04 DIAGNOSIS — I10 Essential (primary) hypertension: Secondary | ICD-10-CM

## 2022-03-04 DIAGNOSIS — H6121 Impacted cerumen, right ear: Secondary | ICD-10-CM | POA: Diagnosis not present

## 2022-03-04 NOTE — Discharge Instructions (Addendum)
You could try the Debrox drops once or twice weekly to prevent wax buildup.   I recommend the DASH diet and monitoring your blood pressure. If they continue to be high I recommend restarting your blood pressure medication or seeing your primary care provider about management.

## 2022-03-04 NOTE — ED Provider Notes (Signed)
[facility]    CSN: [id number] Arrival date & time: 03/04/22  1004      History   Chief Complaint Chief Complaint  Patient presents with   Otalgia    HPI Caleb Heath is a 46 y.o. male.  Presents with 3-week history of right ear pain Reports last night it was causing right-sided headache. He thought he noticed some drainage from the ear, waxy Yesterday the left ear started hurting No fevers.  Denies nasal congestion or sinus pressure.  He has not taken any medications for pain Denies history of ear problems or wax buildup Uses Q-tips in the ears  Past Medical History:  Diagnosis Date   Anxiety    Fx wrist    Hypertension    Migraine    Sciatica    Thyroid disease     Patient Active Problem List   Diagnosis Date Noted   Influenza vaccination declined 02/01/2019   23-polyvalent pneumococcal polysaccharide vaccine declined 10/30/2018   Tobacco dependence 10/30/2018   Noncompliance with medications 10/30/2018   Family history of colon cancer 10/30/2018   Seasonal allergic rhinitis due to pollen 08/25/2017   Abnormal thyroid function test 08/25/2017   Intractable migraine without aura and without status migrainosus 08/25/2017   HTN (hypertension) 09/02/2012   Muscle cramps 09/02/2012    Past Surgical History:  Procedure Laterality Date   NO PAST SURGERIES         Home Medications    Prior to Admission medications   Medication Sig Start Date End Date Taking? Authorizing Provider  losartan-hydrochlorothiazide (HYZAAR) 50-12.5 MG tablet Take 1 tablet by mouth daily. Patient not taking: Reported on 11/13/2021 07/06/21   Marcine Matar, MD  omeprazole (PRILOSEC) 20 MG capsule Take 1 capsule (20 mg total) by mouth daily. 11/13/21   Marcine Matar, MD  pravastatin (PRAVACHOL) 10 MG tablet Take 1 tablet (10 mg total) by mouth daily. Patient not taking: Reported on 11/13/2021 07/06/21   Marcine Matar, MD    Family History Family History   Problem Relation Age of Onset   Hypertension Mother    Cancer Father        colon CA   Colon cancer Father 28   Hypertension Maternal Aunt    Heart attack Maternal Grandmother    Diabetes Maternal Grandmother     Social History Social History   Tobacco Use   Smoking status: Every Day    Packs/day: 0.50    Types: Cigarettes   Smokeless tobacco: Never   Tobacco comments:    .5 ppd  Vaping Use   Vaping Use: Never used  Substance Use Topics   Alcohol use: Yes    Alcohol/week: 12.0 standard drinks of alcohol    Types: 12 Cans of beer per week    Comment: weekend   Drug use: No     Allergies   Amoxicillin, Hydrochlorothiazide, and Amlodipine   Review of Systems Review of Systems  HENT:  Positive for ear pain.    Per HPI  Physical Exam Triage Vital Signs ED Triage Vitals [03/04/22 1258]  Enc Vitals Group     BP (!) 176/95     Pulse Rate (!) 122     Resp 18     Temp 99.2 F (37.3 C)     Temp Source Oral     SpO2 97 %     Weight      Height      Head Circumference  Peak Flow      Pain Score 5     Pain Loc      Pain Edu?      Excl. in GC?    No data found.  Updated Vital Signs BP (!) 176/95 (BP Location: Left Arm)   Pulse (!) 122   Temp 99.2 F (37.3 C) (Oral)   Resp 18   SpO2 97%   Physical Exam Vitals and nursing note reviewed.  Constitutional:      General: He is not in acute distress. HENT:     Right Ear: External ear normal. There is impacted cerumen.     Left Ear: Tympanic membrane, ear canal and external ear normal.     Mouth/Throat:     Pharynx: Oropharynx is clear.  Eyes:     Conjunctiva/sclera: Conjunctivae normal.  Cardiovascular:     Rate and Rhythm: Normal rate and regular rhythm.  Pulmonary:     Effort: Pulmonary effort is normal.  Neurological:     Mental Status: He is alert and oriented to person, place, and time.    UC Treatments / Results  Labs (all labs ordered are listed, but only abnormal results are  displayed) Labs Reviewed - No data to display  EKG   Radiology No results found.  Procedures Procedures (including critical care time)  Medications Ordered in UC Medications - No data to display  Initial Impression / Assessment and Plan / UC Course  I have reviewed the triage vital signs and the nursing notes.  Pertinent labs & imaging results that were available during my care of the patient were reviewed by me and considered in my medical decision making (see chart for details).  Right ear canal wax impaction Irrigation performed with success; removal of wax, improvement in patient's symptoms No sign of infection visualized after wax removed Discussed trying Debrox drops twice weekly to prevent wax buildup in the future Return precautions discussed. Patient agrees to plan  Final Clinical Impressions(s) / UC Diagnoses   Final diagnoses:  Impacted cerumen of right ear  Hypertension, unspecified type     Discharge Instructions      You could try the Debrox drops once or twice weekly to prevent wax buildup.   I recommend the DASH diet and monitoring your blood pressure. If they continue to be high I recommend restarting your blood pressure medication or seeing your primary care provider about management.      ED Prescriptions   None    PDMP not reviewed this encounter.   Marlow Baars, [place] 03/04/22 1412

## 2022-03-04 NOTE — ED Triage Notes (Signed)
Pt c/o rt ear pain for 2 wks and now having lt ear pain. States has some drainage from rt ear that looked like wax. Denies taking any meds.

## 2022-03-20 ENCOUNTER — Encounter: Payer: Self-pay | Admitting: Physician Assistant

## 2022-03-20 ENCOUNTER — Ambulatory Visit: Payer: Managed Care, Other (non HMO) | Attending: Physician Assistant | Admitting: Physician Assistant

## 2022-03-20 ENCOUNTER — Other Ambulatory Visit: Payer: Self-pay

## 2022-03-20 VITALS — BP 183/129 | HR 114 | Ht 74.0 in | Wt 173.6 lb

## 2022-03-20 DIAGNOSIS — I160 Hypertensive urgency: Secondary | ICD-10-CM

## 2022-03-20 DIAGNOSIS — I10 Essential (primary) hypertension: Secondary | ICD-10-CM

## 2022-03-20 DIAGNOSIS — H6993 Unspecified Eustachian tube disorder, bilateral: Secondary | ICD-10-CM

## 2022-03-20 DIAGNOSIS — H6591 Unspecified nonsuppurative otitis media, right ear: Secondary | ICD-10-CM

## 2022-03-20 DIAGNOSIS — E785 Hyperlipidemia, unspecified: Secondary | ICD-10-CM

## 2022-03-20 DIAGNOSIS — Z09 Encounter for follow-up examination after completed treatment for conditions other than malignant neoplasm: Secondary | ICD-10-CM

## 2022-03-20 MED ORDER — LOSARTAN POTASSIUM-HCTZ 50-12.5 MG PO TABS
1.0000 | ORAL_TABLET | Freq: Every day | ORAL | 6 refills | Status: DC
Start: 2022-03-20 — End: 2023-05-22
  Filled 2022-03-20: qty 30, 30d supply, fill #0

## 2022-03-20 MED ORDER — FLUTICASONE PROPIONATE 50 MCG/ACT NA SUSP
2.0000 | Freq: Every day | NASAL | 6 refills | Status: DC
  Filled 2022-03-20: qty 16, 30d supply, fill #0

## 2022-03-20 MED ORDER — AZITHROMYCIN 250 MG PO TABS
ORAL_TABLET | ORAL | 0 refills | Status: AC
  Filled 2022-03-20: qty 6, 5d supply, fill #0

## 2022-03-20 MED ORDER — CLONIDINE HCL 0.1 MG PO TABS
0.1000 mg | ORAL_TABLET | Freq: Once | ORAL | Status: AC
  Administered 2022-03-20: 0.1 mg via ORAL

## 2022-03-20 MED ORDER — PRAVASTATIN SODIUM 10 MG PO TABS
10.0000 mg | ORAL_TABLET | Freq: Every day | ORAL | 4 refills | Status: DC
  Filled 2022-03-20: qty 30, 30d supply, fill #0

## 2022-03-20 NOTE — Patient Instructions (Addendum)
Check blood pressures daily and record.  Bring these to your next visit. Drink 80-100 ounces water daily   Hypertension, Adult High blood pressure (hypertension) is when the force of blood pumping through the arteries is too strong. The arteries are the blood vessels that carry blood from the heart throughout the body. Hypertension forces the heart to work harder to pump blood and may cause arteries to become narrow or stiff. Untreated or uncontrolled hypertension can lead to a heart attack, heart failure, a stroke, kidney disease, and other problems. A blood pressure reading consists of a higher number over a lower number. Ideally, your blood pressure should be below 120/80. The first ("top") number is called the systolic pressure. It is a measure of the pressure in your arteries as your heart beats. The second ("bottom") number is called the diastolic pressure. It is a measure of the pressure in your arteries as the heart relaxes. What are the causes? The exact cause of this condition is not known. There are some conditions that result in high blood pressure. What increases the risk? Certain factors may make you more likely to develop high blood pressure. Some of these risk factors are under your control, including: Smoking. Not getting enough exercise or physical activity. Being overweight. Having too much fat, sugar, calories, or salt (sodium) in your diet. Drinking too much alcohol. Other risk factors include: Having a personal history of heart disease, diabetes, high cholesterol, or kidney disease. Stress. Having a family history of high blood pressure and high cholesterol. Having obstructive sleep apnea. Age. The risk increases with age. What are the signs or symptoms? High blood pressure may not cause symptoms. Very high blood pressure (hypertensive crisis) may cause: Headache. Fast or irregular heartbeats (palpitations). Shortness of breath. Nosebleed. Nausea and vomiting. Vision  changes. Severe chest pain, dizziness, and seizures. How is this diagnosed? This condition is diagnosed by measuring your blood pressure while you are seated, with your arm resting on a flat surface, your legs uncrossed, and your feet flat on the floor. The cuff of the blood pressure monitor will be placed directly against the skin of your upper arm at the level of your heart. Blood pressure should be measured at least twice using the same arm. Certain conditions can cause a difference in blood pressure between your right and left arms. If you have a high blood pressure reading during one visit or you have normal blood pressure with other risk factors, you may be asked to: Return on a different day to have your blood pressure checked again. Monitor your blood pressure at home for 1 week or longer. If you are diagnosed with hypertension, you may have other blood or imaging tests to help your health care provider understand your overall risk for other conditions. How is this treated? This condition is treated by making healthy lifestyle changes, such as eating healthy foods, exercising more, and reducing your alcohol intake. You may be referred for counseling on a healthy diet and physical activity. Your health care provider may prescribe medicine if lifestyle changes are not enough to get your blood pressure under control and if: Your systolic blood pressure is above 130. Your diastolic blood pressure is above 80. Your personal target blood pressure may vary depending on your medical conditions, your age, and other factors. Follow these instructions at home: Eating and drinking  Eat a diet that is high in fiber and potassium, and low in sodium, added sugar, and fat. An example of this  eating plan is called the DASH diet. DASH stands for Dietary Approaches to Stop Hypertension. To eat this way: Eat plenty of fresh fruits and vegetables. Try to fill one half of your plate at each meal with fruits and  vegetables. Eat whole grains, such as whole-wheat pasta, brown rice, or whole-grain bread. Fill about one fourth of your plate with whole grains. Eat or drink low-fat dairy products, such as skim milk or low-fat yogurt. Avoid fatty cuts of meat, processed or cured meats, and poultry with skin. Fill about one fourth of your plate with lean proteins, such as fish, chicken without skin, beans, eggs, or tofu. Avoid pre-made and processed foods. These tend to be higher in sodium, added sugar, and fat. Reduce your daily sodium intake. Many people with hypertension should eat less than 1,500 mg of sodium a day. Do not drink alcohol if: Your health care provider tells you not to drink. You are pregnant, may be pregnant, or are planning to become pregnant. If you drink alcohol: Limit how much you have to: 0-1 drink a day for women. 0-2 drinks a day for men. Know how much alcohol is in your drink. In the [place] one drink equals one 12 oz bottle of beer (355 mL), one 5 oz glass of wine (148 mL), or one 1 oz glass of hard liquor (44 mL). Lifestyle  Work with your health care provider to maintain a healthy body weight or to lose weight. Ask what an ideal weight is for you. Get at least 30 minutes of exercise that causes your heart to beat faster (aerobic exercise) most days of the week. Activities may include walking, swimming, or biking. Include exercise to strengthen your muscles (resistance exercise), such as Pilates or lifting weights, as part of your weekly exercise routine. Try to do these types of exercises for 30 minutes at least 3 days a week. Do not use any products that contain nicotine or tobacco. These products include cigarettes, chewing tobacco, and vaping devices, such as e-cigarettes. If you need help quitting, ask your health care provider. Monitor your blood pressure at home as told by your health care provider. Keep all follow-up visits. This is important. Medicines Take  over-the-counter and prescription medicines only as told by your health care provider. Follow directions carefully. Blood pressure medicines must be taken as prescribed. Do not skip doses of blood pressure medicine. Doing this puts you at risk for problems and can make the medicine less effective. Ask your health care provider about side effects or reactions to medicines that you should watch for. Contact a health care provider if you: Think you are having a reaction to a medicine you are taking. Have headaches that keep coming back (recurring). Feel dizzy. Have swelling in your ankles. Have trouble with your vision. Get help right away if you: Develop a severe headache or confusion. Have unusual weakness or numbness. Feel faint. Have severe pain in your chest or abdomen. Vomit repeatedly. Have trouble breathing. These symptoms may be an emergency. Get help right away. Call 911. Do not wait to see if the symptoms will go away. Do not drive yourself to the hospital. Summary Hypertension is when the force of blood pumping through your arteries is too strong. If this condition is not controlled, it may put you at risk for serious complications. Your personal target blood pressure may vary depending on your medical conditions, your age, and other factors. For most people, a normal blood pressure is less than 120/80.  Hypertension is treated with lifestyle changes, medicines, or a combination of both. Lifestyle changes include losing weight, eating a healthy, low-sodium diet, exercising more, and limiting alcohol. This information is not intended to replace advice given to you by your health care provider. Make sure you discuss any questions you have with your health care provider. Document Revised: 01/16/2021 Document Reviewed: 01/16/2021 Elsevier Patient Education  [place].

## 2022-03-20 NOTE — Progress Notes (Signed)
Patient ID: Caleb Heath, male   DOB: May 30, 1975, 46 y.o.   MRN: [id number]   [name], is a 46 y.o. male  GMW:[id number]  YQI:[id number]  DOB - 09/24/1975  Chief Complaint  Patient presents with   Ear Pain       Subjective:   Caleb Heath is a 46 y.o. male here today for a follow up visit after being seen at ED for cerumen impaction.  He is continuing to have ear pain.  He has not been taking his BP or cholesterol meds.  He denies HA/CP/SOB.     No problems updated.  ALLERGIES: Allergies  Allergen Reactions   Amoxicillin Other (See Comments)    "I had hiccups and CP"   Amlodipine Nausea Only and Other (See Comments)    drowsy    PAST MEDICAL HISTORY: Past Medical History:  Diagnosis Date   Anxiety    Fx wrist    Hypertension    Migraine    Sciatica    Thyroid disease     MEDICATIONS AT HOME: Prior to Admission medications   Medication Sig Start Date End Date Taking? Authorizing Provider  azithromycin (ZITHROMAX) 250 MG tablet Take 2 tablets on day 1, then 1 tablet daily on days 2 through 5 03/20/22 03/25/22 Yes Lewi Drost M, PA-C  fluticasone (FLONASE) 50 MCG/ACT nasal spray Place 2 sprays into both nostrils daily. 03/20/22  Yes Adeola Dennen, Marzella Schlein, PA-C  losartan-hydrochlorothiazide (HYZAAR) 50-12.5 MG tablet Take 1 tablet by mouth daily. 03/20/22   Anders Simmonds, PA-C  omeprazole (PRILOSEC) 20 MG capsule Take 1 capsule (20 mg total) by mouth daily. Patient not taking: Reported on 03/20/2022 11/13/21   Marcine Matar, MD  pravastatin (PRAVACHOL) 10 MG tablet Take 1 tablet (10 mg total) by mouth daily. 03/20/22   Mihika Surrette, Marzella Schlein, PA-C    ROS: Neg resp Neg cardiac Neg GI Neg GU Neg MS Neg psych Neg neuro  Objective:   Vitals:   03/20/22 0849  BP: (!) 190/130  Pulse: (!) 114  SpO2: 95%  Weight: 173 lb 9.6 oz (78.7 kg)  Height: 6\' 2"  (1.88 m)   Exam General appearance : Awake, alert, not in any distress. Speech Clear. Not toxic  looking HEENT: Atraumatic and Normocephalic.  B TM with bulging and R with erythema. No perf.   Neck: Supple, no JVD. No cervical lymphadenopathy.  Chest: Good air entry bilaterally, CTAB.  No rales/rhonchi/wheezing CVS: S1 S2 regular, no murmurs.  Extremities: B/L Lower Ext shows no edema, both legs are warm to touch Neurology: Awake alert, and oriented X 3, CN II-XII intact, Non focal Skin: No Rash  Data Review Lab Results  Component Value Date   HGBA1C 5.4 10/30/2018   HGBA1C 5.8 (H) 08/16/2015    Assessment & Plan   1. Hypertensive urgency Clonidine given in office - cloNIDine (CATAPRES) tablet 0.1 mg - Comprehensive metabolic panel; Future  2. Essential hypertension Pick up and resume today - losartan-hydrochlorothiazide (HYZAAR) 50-12.5 MG tablet; Take 1 tablet by mouth daily.  Dispense: 30 tablet; Refill: 6 - Comprehensive metabolic panel; Future  3. Dysfunction of both eustachian tubes - fluticasone (FLONASE) 50 MCG/ACT nasal spray; Place 2 sprays into both nostrils daily.  Dispense: 16 g; Refill: 6  4. OME (otitis media with effusion), right - azithromycin (ZITHROMAX) 250 MG tablet; Take 2 tablets on day 1, then 1 tablet daily on days 2 through 5  Dispense: 6 tablet; Refill: 0  5. Hyperlipidemia, unspecified hyperlipidemia type resume -  pravastatin (PRAVACHOL) 10 MG tablet; Take 1 tablet (10 mg total) by mouth daily.  Dispense: 30 tablet; Refill: 4 - Lipid panel; Future  6. Encounter for examination following treatment at hospital Cerumen impaction resolved    Return for 3 weeks with Caleb Heath for BP and PCP in 3 weeks.  The patient was given clear instructions to go to ER or return to medical center if symptoms don't improve, worsen or new problems develop. The patient verbalized understanding. The patient was told to call to get lab results if they haven't heard anything in the next week.      Freeman Caldron, PA-C [facility], [place]   03/20/2022, 9:03 AM

## 2022-03-27 ENCOUNTER — Other Ambulatory Visit: Payer: Self-pay

## 2022-04-19 ENCOUNTER — Ambulatory Visit: Payer: Managed Care, Other (non HMO) | Admitting: Internal Medicine

## 2022-07-30 ENCOUNTER — Encounter (HOSPITAL_COMMUNITY): Payer: Self-pay | Admitting: Emergency Medicine

## 2022-07-30 ENCOUNTER — Ambulatory Visit (HOSPITAL_COMMUNITY)
Admission: EM | Admit: 2022-07-30 | Discharge: 2022-07-30 | Disposition: A | Discharge status: Home / Self Care | Payer: Managed Care, Other (non HMO) | Attending: Family Medicine | Admitting: Family Medicine

## 2022-07-30 ENCOUNTER — Other Ambulatory Visit: Payer: Self-pay

## 2022-07-30 DIAGNOSIS — J340 Abscess, furuncle and carbuncle of nose: Secondary | ICD-10-CM

## 2022-07-30 DIAGNOSIS — H6123 Impacted cerumen, bilateral: Secondary | ICD-10-CM | POA: Diagnosis not present

## 2022-07-30 MED ORDER — PREDNISONE 20 MG PO TABS
20.0000 mg | ORAL_TABLET | Freq: Every day | ORAL | 0 refills | Status: AC
  Filled 2022-07-30: qty 3, 3d supply, fill #0

## 2022-07-30 MED ORDER — SULFAMETHOXAZOLE-TRIMETHOPRIM 800-160 MG PO TABS
1.0000 | ORAL_TABLET | Freq: Two times a day (BID) | ORAL | 0 refills | Status: AC
  Filled 2022-07-30: qty 20, 10d supply, fill #0

## 2022-07-30 NOTE — Discharge Instructions (Addendum)
As discussed if you develop any facial swelling specifically or your eyes this is a medical emergency go to the nearest emergency department for evaluation.  Take 1 dose of your antibiotics when you pick them up and take a second dose before you go to bed tonight.  Complete entire course of antibiotics.  If symptoms do not resolve after completing medication return for evaluation.

## 2022-07-30 NOTE — ED Triage Notes (Signed)
Pt reports right ear pain for a couple of weeks. States he has been using debrox for relief.   Also reports waking up this morning and noticing his nose was swollen. States he has an abscess at the tip of the nose that has not started draining. States has been feeling a lot of pressure.

## 2022-07-30 NOTE — ED Provider Notes (Signed)
[facility]    CSN: [id number] Arrival date & time: 07/30/22  1319      History   Chief Complaint Chief Complaint  Patient presents with   Otalgia   Facial Swelling    HPI Caleb Heath is a 47 y.o. male.   HPI Patient presents today for evaluation of nasal bridge swelling.  Patient reports he had a small abscess developed last week on the bridge of his nose and the area would heal without intervention.  He reports that the abscess has become increasingly painful but upon awakening this morning that his entire nose is swollen and tender to touch.  He also complains of left ear pain and has a history of earwax buildup.  He uses Debrox drops routinely however this has not helped with current symptoms of ear pain and pressure.  Patient denies any fever, nausea or vomiting.  He denies any pain or swelling below the eyes or around the orbital sockets.    Past Medical History:  Diagnosis Date   Anxiety    Fx wrist    Hypertension    Migraine    Sciatica    Thyroid disease     Patient Active Problem List   Diagnosis Date Noted   Influenza vaccination declined 02/01/2019   23-polyvalent pneumococcal polysaccharide vaccine declined 10/30/2018   Tobacco dependence 10/30/2018   Noncompliance with medications 10/30/2018   Family history of colon cancer 10/30/2018   Seasonal allergic rhinitis due to pollen 08/25/2017   Abnormal thyroid function test 08/25/2017   Intractable migraine without aura and without status migrainosus 08/25/2017   HTN (hypertension) 09/02/2012   Muscle cramps 09/02/2012    Past Surgical History:  Procedure Laterality Date   NO PAST SURGERIES         Home Medications    Prior to Admission medications   Medication Sig Start Date End Date Taking? Authorizing Provider  predniSONE (DELTASONE) 20 MG tablet Take 1 tablet (20 mg total) by mouth daily with breakfast for 3 days. 07/30/22 08/02/22 Yes Bing Neighbors, NP   sulfamethoxazole-trimethoprim (BACTRIM DS) 800-160 MG tablet Take 1 tablet by mouth 2 (two) times daily for 10 days. 07/30/22 08/09/22 Yes Bing Neighbors, NP  fluticasone (FLONASE) 50 MCG/ACT nasal spray Place 2 sprays into both nostrils daily. 03/20/22   Anders Simmonds, PA-C  losartan-hydrochlorothiazide (HYZAAR) 50-12.5 MG tablet Take 1 tablet by mouth daily. 03/20/22   Anders Simmonds, PA-C  omeprazole (PRILOSEC) 20 MG capsule Take 1 capsule (20 mg total) by mouth daily. Patient not taking: Reported on 03/20/2022 11/13/21   Marcine Matar, MD  pravastatin (PRAVACHOL) 10 MG tablet Take 1 tablet (10 mg total) by mouth daily. 03/20/22   Anders Simmonds, PA-C    Family History Family History  Problem Relation Age of Onset   Hypertension Mother    Cancer Father        colon CA   Colon cancer Father 50   Hypertension Maternal Aunt    Heart attack Maternal Grandmother    Diabetes Maternal Grandmother     Social History Social History   Tobacco Use   Smoking status: Every Day    Packs/day: .5    Types: Cigarettes   Smokeless tobacco: Never   Tobacco comments:    .5 ppd  Vaping Use   Vaping Use: Never used  Substance Use Topics   Alcohol use: Yes    Alcohol/week: 12.0 standard drinks of alcohol    Types: 12  Cans of beer per week    Comment: weekend   Drug use: No     Allergies   Amoxicillin and Amlodipine   Review of Systems Review of Systems Pertinent negatives listed in HPI  Physical Exam Triage Vital Signs ED Triage Vitals [07/30/22 1354]  Enc Vitals Group     BP (!) 154/92     Pulse Rate (!) 101     Resp 18     Temp 98.7 F (37.1 C)     Temp Source Oral     SpO2 99 %     Weight      Height      Head Circumference      Peak Flow      Pain Score 7     Pain Loc      Pain Edu?      Excl. in GC?    No data found.  Updated Vital Signs BP (!) 154/92 (BP Location: Left Arm)   Pulse (!) 101   Temp 98.7 F (37.1 C) (Oral)   Resp 18   SpO2  99%   Visual Acuity Right Eye Distance:   Left Eye Distance:   Bilateral Distance:    Right Eye Near:   Left Eye Near:    Bilateral Near:     Physical Exam HENT:     Head: Normocephalic.      Right Ear: There is impacted cerumen.     Left Ear: There is impacted cerumen.  Eyes:     Pupils: Pupils are equal, round, and reactive to light.  Cardiovascular:     Rate and Rhythm: Regular rhythm. Tachycardia present.  Pulmonary:     Effort: Pulmonary effort is normal.     Breath sounds: Normal breath sounds.  Skin:    General: Skin is warm.  Neurological:     General: No focal deficit present.     Mental Status: He is alert.      UC Treatments / Results  Labs (all labs ordered are listed, but only abnormal results are displayed) Labs Reviewed - No data to display  EKG   Radiology No results found.  Procedures Procedures (including critical care time)  Medications Ordered in UC Medications - No data to display  Initial Impression / Assessment and Plan / UC Course  I have reviewed the triage vital signs and the nursing notes.  Pertinent labs & imaging results that were available during my care of the patient were reviewed by me and considered in my medical decision making (see chart for details).    Nasal abscess treated empirically to include coverage for MRSA with double strength Bactrim twice daily for 10 days.  Given diffuse swelling of the nose will also treat with prednisone 20 mg once daily for for 3 days. Bilateral ears irrigated by RN, and patient tolerated successfully.  Patient given strict ER precautions if swelling or pain worsens or  he develops any eye pressure,swelling or pain below the eyes this is a medical emergency and warrants immediate evaluation in the setting of the ED.  Patient verbalized understanding and agreement with today's plan.   Final diagnoses:  Nasal abscess  Bilateral impacted cerumen     Discharge Instructions      As  discussed if you develop any facial swelling specifically or your eyes this is a medical emergency go to the nearest emergency department for evaluation.  Take 1 dose of your antibiotics when you pick them up and take a  second dose before you go to bed tonight.  Complete entire course of antibiotics.  If symptoms do not resolve after completing medication return for evaluation.     ED Prescriptions     Medication Sig Dispense Auth. Provider   sulfamethoxazole-trimethoprim (BACTRIM DS) 800-160 MG tablet Take 1 tablet by mouth 2 (two) times daily for 10 days. 20 tablet Bing Neighbors, NP   predniSONE (DELTASONE) 20 MG tablet Take 1 tablet (20 mg total) by mouth daily with breakfast for 3 days. 3 tablet Bing Neighbors, NP      PDMP not reviewed this encounter.    Bing Neighbors, NP 07/30/22 [id number]

## 2022-09-23 ENCOUNTER — Encounter (HOSPITAL_COMMUNITY): Payer: Self-pay | Admitting: *Deleted

## 2022-09-23 ENCOUNTER — Other Ambulatory Visit: Payer: Self-pay

## 2022-09-23 ENCOUNTER — Ambulatory Visit (HOSPITAL_COMMUNITY)
Admission: EM | Admit: 2022-09-23 | Discharge: 2022-09-23 | Disposition: A | Discharge status: Home / Self Care | Payer: 59 | Attending: Internal Medicine | Admitting: Internal Medicine

## 2022-09-23 DIAGNOSIS — K0889 Other specified disorders of teeth and supporting structures: Secondary | ICD-10-CM

## 2022-09-23 DIAGNOSIS — K047 Periapical abscess without sinus: Secondary | ICD-10-CM

## 2022-09-23 DIAGNOSIS — H6123 Impacted cerumen, bilateral: Secondary | ICD-10-CM

## 2022-09-23 DIAGNOSIS — Z91148 Patient's other noncompliance with medication regimen for other reason: Secondary | ICD-10-CM

## 2022-09-23 DIAGNOSIS — I10 Essential (primary) hypertension: Secondary | ICD-10-CM

## 2022-09-23 MED ORDER — CLINDAMYCIN HCL 300 MG PO CAPS
300.0000 mg | ORAL_CAPSULE | Freq: Three times a day (TID) | ORAL | 0 refills | Status: DC
  Filled 2022-09-23: qty 21, 7d supply, fill #0

## 2022-09-23 NOTE — Discharge Instructions (Addendum)
It is very important you take medication for high blood pressure to prevent stroke, heart attack and renal failure.

## 2022-09-23 NOTE — ED Provider Notes (Signed)
[facility]    CSN: [id number] Arrival date & time: 09/23/22  1205      History   Chief Complaint Chief Complaint  Patient presents with   Ear Fullness   Dental Problem    HPI Caleb Heath is a 47 y.o. male who presents with 2 problems 1- R posterior lower gum pain and swelling since this am, has very sensitive molar to cold.   2- His ears feel plugged again since yesterday. Has hx of cerumen impaction.     Past Medical History:  Diagnosis Date   Anxiety    Fx wrist    Hypertension    Migraine    Sciatica    Thyroid disease     Patient Active Problem List   Diagnosis Date Noted   Influenza vaccination declined 02/01/2019   23-polyvalent pneumococcal polysaccharide vaccine declined 10/30/2018   Tobacco dependence 10/30/2018   Noncompliance with medications 10/30/2018   Family history of colon cancer 10/30/2018   Seasonal allergic rhinitis due to pollen 08/25/2017   Abnormal thyroid function test 08/25/2017   Intractable migraine without aura and without status migrainosus 08/25/2017   HTN (hypertension) 09/02/2012   Muscle cramps 09/02/2012    Past Surgical History:  Procedure Laterality Date   NO PAST SURGERIES         Home Medications    Prior to Admission medications   Medication Sig Start Date End Date Taking? Authorizing Provider  clindamycin (CLEOCIN) 300 MG capsule Take 1 capsule (300 mg total) by mouth 3 (three) times daily. 09/23/22  Yes Rodriguez-Southworth, Nettie Elm, PA-C  fluticasone (FLONASE) 50 MCG/ACT nasal spray Place 2 sprays into both nostrils daily. 03/20/22   Anders Simmonds, PA-C  losartan-hydrochlorothiazide (HYZAAR) 50-12.5 MG tablet Take 1 tablet by mouth daily. 03/20/22   Anders Simmonds, PA-C  pravastatin (PRAVACHOL) 10 MG tablet Take 1 tablet (10 mg total) by mouth daily. 03/20/22   Anders Simmonds, PA-C    Family History Family History  Problem Relation Age of Onset   Hypertension Mother    Cancer Father         colon CA   Colon cancer Father 82   Hypertension Maternal Aunt    Heart attack Maternal Grandmother    Diabetes Maternal Grandmother     Social History Social History   Tobacco Use   Smoking status: Every Day    Packs/day: .5    Types: Cigarettes   Smokeless tobacco: Never   Tobacco comments:    .5 ppd  Vaping Use   Vaping Use: Never used  Substance Use Topics   Alcohol use: Yes    Alcohol/week: 12.0 standard drinks of alcohol    Types: 12 Cans of beer per week    Comment: weekend   Drug use: Yes    Types: Marijuana     Allergies   Amoxicillin, Banana, Bee venom, and Amlodipine   Review of Systems Review of Systems  HENT:  Positive for hearing loss. Negative for ear discharge and ear pain.   Respiratory:  Negative for chest tightness and shortness of breath.   Cardiovascular:  Negative for chest pain.       Has hx of HTN, but he states BP meds dont work for him, so he does not take them. Has been tried on 4 different ones. Has not had cardiology consult or renal US     Physical Exam Triage Vital Signs ED Triage Vitals  Enc Vitals Group     BP  09/23/22 1402 (!) 168/116     Pulse Rate 09/23/22 1402 (!) 112     Resp 09/23/22 1402 16     Temp 09/23/22 1402 98.4 F (36.9 C)     Temp Source 09/23/22 1402 Oral     SpO2 09/23/22 1402 96 %     Weight --      Height --      Head Circumference --      Peak Flow --      Pain Score 09/23/22 1407 0     Pain Loc --      Pain Edu? --      Excl. in GC? --    No data found.  Updated Vital Signs BP (!) 168/116   Pulse (!) 112   Temp 98.4 F (36.9 C) (Oral)   Resp 16   SpO2 96%  Repeated BP 156/108 Visual Acuity Right Eye Distance:   Left Eye Distance:   Bilateral Distance:    Right Eye Near:   Left Eye Near:    Bilateral Near:     Physical Exam Vitals and nursing note reviewed.  Constitutional:      General: He is not in acute distress.    Appearance: He is normal weight. He is not  toxic-appearing.  HENT:     Right Ear: External ear normal. There is impacted cerumen.     Left Ear: External ear normal. There is impacted cerumen.     Ears:     Comments: After lavage TM's are healthy    Mouth/Throat:     Comments: Gum on R lower area, around wisdom tooth is little red and tender. Has some cavities on the wisdom tooth.  Eyes:     General: No scleral icterus.    Conjunctiva/sclera: Conjunctivae normal.  Cardiovascular:     Rate and Rhythm: Normal rate and regular rhythm.     Heart sounds: No murmur heard. Pulmonary:     Effort: Pulmonary effort is normal.     Breath sounds: Normal breath sounds.  Musculoskeletal:        General: Normal range of motion.     Cervical back: Neck supple.  Skin:    General: Skin is warm and dry.     Findings: No rash.  Neurological:     Mental Status: He is alert and oriented to person, place, and time.     Gait: Gait normal.  Psychiatric:        Mood and Affect: Mood normal.        Behavior: Behavior normal.        Thought Content: Thought content normal.        Judgment: Judgment normal.      UC Treatments / Results  Labs (all labs ordered are listed, but only abnormal results are displayed) Labs Reviewed - No data to display  EKG   Radiology No results found.  Procedures Procedures (including critical care time)  Medications Ordered in UC Medications - No data to display  Initial Impression / Assessment and Plan / UC Course  I have reviewed the triage vital signs and the nursing notes.  Bilateral cerumen impaction Dental pain Uncontrolled HTN Noncompliant with medication  I placed him on Clindamycin as noted for gum infection Ear lavage done bilaterally, and symptoms of decreased hearing resolved Needs to FU with PCP for HTN. He plans to find a different one.   Final Clinical Impressions(s) / UC Diagnoses   Final diagnoses:  Pain, dental  Hearing loss due to cerumen impaction, bilateral   Uncontrolled hypertension  Dental infection     Discharge Instructions      It is very important you take medication for high blood pressure to prevent stroke, heart attack and renal failure.        ED Prescriptions     Medication Sig Dispense Auth. Provider   clindamycin (CLEOCIN) 300 MG capsule Take 1 capsule (300 mg total) by mouth 3 (three) times daily. 21 capsule Rodriguez-Southworth, Nettie Elm, PA-C      PDMP not reviewed this encounter.   Garey Ham, [place] 09/23/22 1514

## 2022-09-23 NOTE — ED Triage Notes (Addendum)
C/O left ear fullness onset yesterday "that has moved to the right ear" this AM. Denies pain. Has been using Debrox and trying to flush with peroxide.  C/O starting with left lower tooth pain approx 2 wks ago which has moved to right lower tooth pain, though "I'm good right now".  Pt states he is supposed to be taking HTN med "but it never worked for me". Discussed importance of f/u with PCP RE: HTN. Discussed S/S CVA and told to go to ED immediately if any develop.

## 2022-09-30 ENCOUNTER — Other Ambulatory Visit: Payer: Self-pay

## 2022-10-02 ENCOUNTER — Ambulatory Visit: Payer: Managed Care, Other (non HMO)

## 2022-11-06 ENCOUNTER — Other Ambulatory Visit: Payer: Self-pay

## 2022-11-06 MED ORDER — MELOXICAM 15 MG PO TABS
15.0000 mg | ORAL_TABLET | Freq: Every day | ORAL | 0 refills | Status: DC
  Filled 2022-11-06: qty 10, 10d supply, fill #0

## 2022-11-18 ENCOUNTER — Other Ambulatory Visit: Payer: Self-pay

## 2022-12-11 ENCOUNTER — Encounter (HOSPITAL_COMMUNITY): Payer: Self-pay | Admitting: *Deleted

## 2022-12-11 ENCOUNTER — Other Ambulatory Visit: Payer: Self-pay

## 2022-12-11 ENCOUNTER — Ambulatory Visit (HOSPITAL_COMMUNITY)
Admission: EM | Admit: 2022-12-11 | Discharge: 2022-12-11 | Disposition: A | Discharge status: Home / Self Care | Payer: 59 | Attending: Family Medicine | Admitting: Family Medicine

## 2022-12-11 DIAGNOSIS — H66003 Acute suppurative otitis media without spontaneous rupture of ear drum, bilateral: Secondary | ICD-10-CM

## 2022-12-11 DIAGNOSIS — H938X3 Other specified disorders of ear, bilateral: Secondary | ICD-10-CM

## 2022-12-11 MED ORDER — SULFAMETHOXAZOLE-TRIMETHOPRIM 800-160 MG PO TABS
1.0000 | ORAL_TABLET | Freq: Two times a day (BID) | ORAL | 0 refills | Status: AC
  Filled 2022-12-11: qty 14, 7d supply, fill #0

## 2022-12-11 NOTE — Discharge Instructions (Addendum)
Start oral antibiotics to treat your inner ear infection.  Referral has been placed for ENT their office will call you directly to schedule an appointment if you have not heard anything from their office call their office directly.

## 2022-12-11 NOTE — ED Provider Notes (Signed)
=[ [facility]    CSN: [id number] Arrival date & time: 12/11/22  1148      History   Chief Complaint Chief Complaint  Patient presents with   Otalgia    HPI Caleb Heath is a 47 y.o. male.    Otalgia Here today for evaluation of recurrent hearing loss, ear fullness and now ear pain times a few days.  Patient has been seen here at urgent care multiple times with a similar complaint and has had ears irrigated only to experience recurrent symptoms of ear fullness and cerumen impaction.  Patient endorses current level of ear pain has been present for the last few days.  He had been attempted to irrigate ears at home with Debrox any successful clearing of earwax.  He denies any other associated URI symptoms or fever.  This pain ear pain is a new symptom as he has not had ear pain previously.  Past Medical History:  Diagnosis Date   Anxiety    Fx wrist    Hypertension    Migraine    Sciatica    Thyroid disease     Patient Active Problem List   Diagnosis Date Noted   Influenza vaccination declined 02/01/2019   23-polyvalent pneumococcal polysaccharide vaccine declined 10/30/2018   Tobacco dependence 10/30/2018   Noncompliance with medications 10/30/2018   Family history of colon cancer 10/30/2018   Seasonal allergic rhinitis due to pollen 08/25/2017   Abnormal thyroid function test 08/25/2017   Intractable migraine without aura and without status migrainosus 08/25/2017   HTN (hypertension) 09/02/2012   Muscle cramps 09/02/2012    Past Surgical History:  Procedure Laterality Date   NO PAST SURGERIES         Home Medications    Prior to Admission medications   Medication Sig Start Date End Date Taking? Authorizing Provider  sulfamethoxazole-trimethoprim (BACTRIM DS) 800-160 MG tablet Take 1 tablet by mouth 2 (two) times daily for 7 days. 12/11/22 12/18/22 Yes Bing Neighbors, NP  clindamycin (CLEOCIN) 300 MG capsule Take 1 capsule (300 mg total) by  mouth 3 (three) times daily. 09/23/22   Rodriguez-Southworth, Nettie Elm, PA-C  fluticasone (FLONASE) 50 MCG/ACT nasal spray Place 2 sprays into both nostrils daily. 03/20/22   Anders Simmonds, PA-C  losartan-hydrochlorothiazide (HYZAAR) 50-12.5 MG tablet Take 1 tablet by mouth daily. 03/20/22   Anders Simmonds, PA-C  meloxicam (MOBIC) 15 MG tablet Take 1 tablet (15 mg total) by mouth daily. 11/06/22   Harlene Salts A, PA-C  pravastatin (PRAVACHOL) 10 MG tablet Take 1 tablet (10 mg total) by mouth daily. 03/20/22   Anders Simmonds, PA-C    Family History Family History  Problem Relation Age of Onset   Hypertension Mother    Cancer Father        colon CA   Colon cancer Father 80   Hypertension Maternal Aunt    Heart attack Maternal Grandmother    Diabetes Maternal Grandmother     Social History Social History   Tobacco Use   Smoking status: Every Day    Current packs/day: 0.50    Types: Cigarettes   Smokeless tobacco: Never   Tobacco comments:    .5 ppd  Vaping Use   Vaping status: Never Used  Substance Use Topics   Alcohol use: Yes    Alcohol/week: 12.0 standard drinks of alcohol    Types: 12 Cans of beer per week    Comment: weekend   Drug use: Yes  Types: Marijuana     Allergies   Amoxicillin, Banana, Bee venom, and Amlodipine   Review of Systems Review of Systems  HENT:  Positive for ear pain.      Physical Exam Triage Vital Signs ED Triage Vitals  Encounter Vitals Group     BP 12/11/22 1313 (!) 151/99     Systolic BP Percentile --      Diastolic BP Percentile --      Pulse Rate 12/11/22 1313 95     Resp 12/11/22 1313 18     Temp 12/11/22 1313 98 F (36.7 C)     Temp Source 12/11/22 1313 Oral     SpO2 12/11/22 1313 97 %     Weight --      Height --      Head Circumference --      Peak Flow --      Pain Score 12/11/22 1311 5     Pain Loc --      Pain Education --      Exclude from Growth Chart --    No data found.  Updated Vital  Signs BP (!) 151/99 (BP Location: Left Arm)   Pulse 95   Temp 98 F (36.7 C) (Oral)   Resp 18   SpO2 97%   Visual Acuity Right Eye Distance:   Left Eye Distance:   Bilateral Distance:    Right Eye Near:   Left Eye Near:    Bilateral Near:     Physical Exam Vitals reviewed.  Constitutional:      Appearance: Normal appearance.  HENT:     Head: Normocephalic and atraumatic.     Right Ear: Decreased hearing noted. Tenderness present. There is impacted cerumen.     Left Ear: Decreased hearing noted. Swelling and tenderness present.     Ears:     Comments: Purulent discharge left ear Eyes:     Extraocular Movements: Extraocular movements intact.     Pupils: Pupils are equal, round, and reactive to light.  Cardiovascular:     Rate and Rhythm: Normal rate and regular rhythm.  Pulmonary:     Effort: Pulmonary effort is normal.     Breath sounds: Normal breath sounds.  Neurological:     General: No focal deficit present.     Mental Status: He is alert.      UC Treatments / Results  Labs (all labs ordered are listed, but only abnormal results are displayed) Labs Reviewed - No data to display  EKG   Radiology No results found.  Procedures Procedures (including critical care time)  Medications Ordered in UC Medications - No data to display  Initial Impression / Assessment and Plan / UC Course  I have reviewed the triage vital signs and the nursing notes.  Pertinent labs & imaging results that were available during my care of the patient were reviewed by me and considered in my medical decision making (see chart for details).    9 recurrent otitis media and popping both ears with ear fullness, ENT referral placed as patient has been seen here at urgent care multiple times for ear fullness and is now exhibiting hearing loss.  Will treat empirically with antibiotics for infection noted to be mostly in the left ear.  Patient advised to complete antibiotics as  prescribed and to keep follow-up with ENT. Final Clinical Impressions(s) / UC Diagnoses   Final diagnoses:  Non-recurrent acute suppurative otitis media of both ears without spontaneous rupture of tympanic  membranes  Ear fullness, bilateral     Discharge Instructions      Start oral antibiotics to treat your inner ear infection.  Referral has been placed for ENT their office will call you directly to schedule an appointment if you have not heard anything from their office call their office directly.     ED Prescriptions     Medication Sig Dispense Auth. Provider   sulfamethoxazole-trimethoprim (BACTRIM DS) 800-160 MG tablet Take 1 tablet by mouth 2 (two) times daily for 7 days. 14 tablet Bing Neighbors, NP      PDMP not reviewed this encounter.   Bing Neighbors, NP 12/11/22 [phone]

## 2022-12-11 NOTE — ED Triage Notes (Signed)
Pt states he has bilateral eat pain he states the debrox drops given at last OV aren't working and he needs a referral to ENT

## 2022-12-12 ENCOUNTER — Other Ambulatory Visit: Payer: Self-pay

## 2023-01-09 ENCOUNTER — Ambulatory Visit (HOSPITAL_COMMUNITY)
Admission: EM | Admit: 2023-01-09 | Discharge: 2023-01-09 | Disposition: A | Discharge status: Home / Self Care | Payer: 59 | Attending: Internal Medicine | Admitting: Internal Medicine

## 2023-01-09 ENCOUNTER — Other Ambulatory Visit: Payer: Self-pay

## 2023-01-09 ENCOUNTER — Encounter (HOSPITAL_COMMUNITY): Payer: Self-pay

## 2023-01-09 DIAGNOSIS — H60393 Other infective otitis externa, bilateral: Secondary | ICD-10-CM | POA: Diagnosis not present

## 2023-01-09 MED ORDER — OFLOXACIN 0.3 % OT SOLN
10.0000 [drp] | Freq: Every day | OTIC | 0 refills | Status: AC
  Filled 2023-01-09: qty 5, 10d supply, fill #0

## 2023-01-09 NOTE — ED Provider Notes (Signed)
[facility]    CSN: [id number] Arrival date & time: 01/09/23  1301      History   Chief Complaint Chief Complaint  Patient presents with   Ear Pain    HPI Caleb Heath is a 47 y.o. male.   Patient presents to urgent care for evaluation of pain to the right ear that started yesterday.  He has recently been treated for bilateral otitis media infections with bactrim antibiotic prescribed 4 weeks ago. He took all of the antibiotic as prescribed and states this helped with his ear pain until pain returned yesterday. No nausea, vomiting, fevers, chills, viral URI symptoms, tinnitus, dizziness, or recent trauma/injury to the ears. A referral was placed to ENT by previous provider but patient is unable to get in with them until October 11th. States he feels like his ears are clogged with decreased hearing bilaterally. He has been careful and has not gotten water into the ears and has not used Q-tips since last visit.      Past Medical History:  Diagnosis Date   Anxiety    Fx wrist    Hypertension    Migraine    Sciatica    Thyroid disease     Patient Active Problem List   Diagnosis Date Noted   Influenza vaccination declined 02/01/2019   23-polyvalent pneumococcal polysaccharide vaccine declined 10/30/2018   Tobacco dependence 10/30/2018   Noncompliance with medications 10/30/2018   Family history of colon cancer 10/30/2018   Seasonal allergic rhinitis due to pollen 08/25/2017   Abnormal thyroid function test 08/25/2017   Intractable migraine without aura and without status migrainosus 08/25/2017   HTN (hypertension) 09/02/2012   Muscle cramps 09/02/2012    Past Surgical History:  Procedure Laterality Date   NO PAST SURGERIES         Home Medications    Prior to Admission medications   Medication Sig Start Date End Date Taking? Authorizing Provider  ofloxacin (FLOXIN) 0.3 % OTIC solution Place 10 drops into both ears daily for 7 days. 01/09/23  01/19/23 Yes Yuniel Blaney, Donavan Burnet, FNP  clindamycin (CLEOCIN) 300 MG capsule Take 1 capsule (300 mg total) by mouth 3 (three) times daily. 09/23/22   Rodriguez-Southworth, Nettie Elm, PA-C  fluticasone (FLONASE) 50 MCG/ACT nasal spray Place 2 sprays into both nostrils daily. 03/20/22   Anders Simmonds, PA-C  losartan-hydrochlorothiazide (HYZAAR) 50-12.5 MG tablet Take 1 tablet by mouth daily. 03/20/22   Anders Simmonds, PA-C  meloxicam (MOBIC) 15 MG tablet Take 1 tablet (15 mg total) by mouth daily. 11/06/22   Harlene Salts A, PA-C  pravastatin (PRAVACHOL) 10 MG tablet Take 1 tablet (10 mg total) by mouth daily. 03/20/22   Anders Simmonds, PA-C    Family History Family History  Problem Relation Age of Onset   Hypertension Mother    Cancer Father        colon CA   Colon cancer Father 35   Hypertension Maternal Aunt    Heart attack Maternal Grandmother    Diabetes Maternal Grandmother     Social History Social History   Tobacco Use   Smoking status: Every Day    Current packs/day: 0.50    Types: Cigarettes   Smokeless tobacco: Never   Tobacco comments:    .5 ppd  Vaping Use   Vaping status: Never Used  Substance Use Topics   Alcohol use: Yes    Alcohol/week: 12.0 standard drinks of alcohol    Types: 12 Cans of beer per  week    Comment: weekend   Drug use: Yes    Types: Marijuana     Allergies   Amoxicillin, Banana, Bee venom, and Amlodipine   Review of Systems Review of Systems Per HPI  Physical Exam Triage Vital Signs ED Triage Vitals  Encounter Vitals Group     BP 01/09/23 1402 (!) 159/113     Systolic BP Percentile --      Diastolic BP Percentile --      Pulse Rate 01/09/23 1401 97     Resp 01/09/23 1401 17     Temp 01/09/23 1401 99.1 F (37.3 C)     Temp Source 01/09/23 1401 Oral     SpO2 01/09/23 1401 100 %     Weight --      Height --      Head Circumference --      Peak Flow --      Pain Score 01/09/23 1401 8     Pain Loc --      Pain  Education --      Exclude from Growth Chart --    No data found.  Updated Vital Signs BP (!) 159/113   Pulse 97   Temp 99.1 F (37.3 C) (Oral)   Resp 17   SpO2 100%   Visual Acuity Right Eye Distance:   Left Eye Distance:   Bilateral Distance:    Right Eye Near:   Left Eye Near:    Bilateral Near:     Physical Exam Vitals and nursing note reviewed.  Constitutional:      Appearance: He is not ill-appearing or toxic-appearing.  HENT:     Head: Normocephalic and atraumatic.     Right Ear: Hearing and external ear normal. There is impacted cerumen.     Left Ear: Hearing and external ear normal. There is impacted cerumen.     Nose: Nose normal.     Mouth/Throat:     Lips: Pink.     Mouth: Mucous membranes are moist. No injury.     Tongue: No lesions. Tongue does not deviate from midline.     Palate: No mass and lesions.     Pharynx: Oropharynx is clear. Uvula midline. No pharyngeal swelling, oropharyngeal exudate, posterior oropharyngeal erythema or uvula swelling.     Tonsils: No tonsillar exudate or tonsillar abscesses.  Eyes:     General: Lids are normal. Vision grossly intact. Gaze aligned appropriately.     Extraocular Movements: Extraocular movements intact.     Conjunctiva/sclera: Conjunctivae normal.  Pulmonary:     Effort: Pulmonary effort is normal.  Musculoskeletal:     Cervical back: Neck supple.  Lymphadenopathy:     Cervical: Cervical adenopathy present.  Skin:    General: Skin is warm and dry.     Capillary Refill: Capillary refill takes less than 2 seconds.     Findings: No rash.  Neurological:     General: No focal deficit present.     Mental Status: He is alert and oriented to person, place, and time. Mental status is at baseline.     Cranial Nerves: No dysarthria or facial asymmetry.  Psychiatric:        Mood and Affect: Mood normal.        Speech: Speech normal.        Behavior: Behavior normal.        Thought Content: Thought content normal.         Judgment: Judgment normal.  UC Treatments / Results  Labs (all labs ordered are listed, but only abnormal results are displayed) Labs Reviewed - No data to display  EKG   Radiology No results found.  Procedures Procedures (including critical care time)  Medications Ordered in UC Medications - No data to display  Initial Impression / Assessment and Plan / UC Course  I have reviewed the triage vital signs and the nursing notes.  Pertinent labs & imaging results that were available during my care of the patient were reviewed by me and considered in my medical decision making (see chart for details).   1. Infective otitis externa of both ears Presentation consistent with otitis externa after ear lavage by nursing staff. Will manage this with 10 otic drops as prescribed for 7 days since I am unable to see the eardrum of the affected ear. Encouraged to avoid getting water into affected ear(s) for at least 7-10 days. Over the counter medications as needed for pain.   Counseled patient on potential for adverse effects with medications prescribed/recommended today, strict ER and return-to-clinic precautions discussed, patient verbalized understanding.    Final Clinical Impressions(s) / UC Diagnoses   Final diagnoses:  Infective otitis externa of both ears     Discharge Instructions      You have an ear infection of the ear canal known as otitis externa. Use ear drops as prescribed for 7 days. Do not place anything smaller than elbow deep into ear canal- this includes Q-tips. You may place a small amount of rubbing alcohol onto the end of a Q-tip and place this into the outer ear canal to dry up any remaining water that may have gotten into the ear while showering or submerging head underwater to prevent this type of infection in the future.       ED Prescriptions     Medication Sig Dispense Auth. Provider   ofloxacin (FLOXIN) 0.3 % OTIC solution Place 10 drops  into both ears daily for 7 days. 5 mL Carlisle Beers, FNP      PDMP not reviewed this encounter.   Carlisle Beers, [place] 01/09/23 1534

## 2023-01-09 NOTE — ED Triage Notes (Signed)
Pt presents with c/o bilateral ear pain X 6 mo. Pt states he flushed his ears at home and it did not help.   Pt states he has already contacted ENT for an appt. States he finished abx he was prescribed for ear infection.

## 2023-01-09 NOTE — Discharge Instructions (Signed)
You have an ear infection of the ear canal known as otitis externa. Use ear drops as prescribed for 7 days. Do not place anything smaller than elbow deep into ear canal- this includes Q-tips. You may place a small amount of rubbing alcohol onto the end of a Q-tip and place this into the outer ear canal to dry up any remaining water that may have gotten into the ear while showering or submerging head underwater to prevent this type of infection in the future.

## 2023-01-10 ENCOUNTER — Other Ambulatory Visit: Payer: Self-pay

## 2023-01-13 NOTE — Plan of Care (Signed)
[facility] Tonsillectomy/Adenoidectomy, Postoperative PEDS care plan entered in error.

## 2023-01-31 ENCOUNTER — Telehealth (INDEPENDENT_AMBULATORY_CARE_PROVIDER_SITE_OTHER): Payer: Self-pay | Admitting: Otolaryngology

## 2023-01-31 ENCOUNTER — Encounter (INDEPENDENT_AMBULATORY_CARE_PROVIDER_SITE_OTHER): Payer: Self-pay

## 2023-01-31 NOTE — Telephone Encounter (Signed)
Called and left vm as well as mychart message with address for appointment on [address]

## 2023-02-03 ENCOUNTER — Ambulatory Visit (INDEPENDENT_AMBULATORY_CARE_PROVIDER_SITE_OTHER): Payer: 59 | Admitting: Otolaryngology

## 2023-02-03 ENCOUNTER — Encounter (INDEPENDENT_AMBULATORY_CARE_PROVIDER_SITE_OTHER): Payer: Self-pay

## 2023-02-03 ENCOUNTER — Ambulatory Visit (INDEPENDENT_AMBULATORY_CARE_PROVIDER_SITE_OTHER): Payer: 59 | Admitting: Audiology

## 2023-02-03 ENCOUNTER — Encounter (INDEPENDENT_AMBULATORY_CARE_PROVIDER_SITE_OTHER): Payer: Self-pay | Admitting: Otolaryngology

## 2023-02-03 VITALS — Ht 74.0 in | Wt 173.0 lb

## 2023-02-03 DIAGNOSIS — H9042 Sensorineural hearing loss, unilateral, left ear, with unrestricted hearing on the contralateral side: Secondary | ICD-10-CM

## 2023-02-03 DIAGNOSIS — H938X3 Other specified disorders of ear, bilateral: Secondary | ICD-10-CM | POA: Diagnosis not present

## 2023-02-03 DIAGNOSIS — H6123 Impacted cerumen, bilateral: Secondary | ICD-10-CM | POA: Diagnosis not present

## 2023-02-03 NOTE — Progress Notes (Signed)
Dear Dr. Tiburcio Pea, Here is my assessment for our mutual patient, Caleb Heath. Thank you for allowing me the opportunity to care for your patient. Please do not hesitate to contact me should you have any other questions. Sincerely, Dr. Jovita Kussmaul  Otolaryngology Clinic Note Referring provider: Dr. Tiburcio Pea HPI:  Caleb Heath is a 47 y.o. male kindly referred by Dr. Tiburcio Pea for evaluation of bilateral ear fullness - intermittent.  Patient reports: main complaint is ear fullness bilaterally which is uncomfortable. Sometimes he can't hear and will have to use Debrox/flushing which helps. Sometimes waxy drainage comes out. Right now no complaints, but just comes and goes.  Patient currently denies: ear pain, fullness, vertigo Patient additionally denies: deep pain in ear canal, eustachian tube symptoms such as popping, crackling, sensitive to pressure changes Patient also denies barotrauma, vestibular suppressant use, ototoxic medication use Prior ear surgery: no Never had problems with ears before Some occupational noise exposure Query maternal HL 2/2 meningitis  He's had multiple ED visits for this, and with multiple ear irrigations and used debros (see below) and multiple rounds of abx. Most recently on ofloxacin  PMHx: HTN, Mood disorder, Migraines, Smoking H&N Surgery: no Personal or FHx of bleeding dz or anesthesia difficulty: no  AP/AC: no  Occupation: Physiological scientist. Lives in [place]   Independent Review of Additional Tests or Records:  Primary care and ED notes reviewed: multiple ED visits for ear fullness in 2023 and 2024 (prescribed zithromax, flonase 2023 late); Nasal abscess 07/2022, Rx with bactrim. 11/2022: Seen in UC for ear fullness and pain and hearing loss. Irrigated ears prior, now with recurrent symptoms. Used debrox, no improvement. Noted to have purulent drainage left ear, started on Bactrim 12/2022: UC - States he feels like his ears are clogged with  decreased hearing bilaterally. He has been careful and has not gotten water into the ears and has not used Q-tips since last visit. Started on ofloxacin  CTH (2015): Mastoids and ME well aerated   02/03/2023 Audiogram was independently reviewed and interpreted by me and it reveals Right ear: normal hearing thresholds; 96% word interpretation at 55dB; type A tympanogram Left ear: nl hearing thresholds with mild likely SNHL loss at 8000 Hz; 96% word interpretation at 55dB; type A tympanogram  SNHL= Sensorineural hearing loss   PMH/Meds/All/SocHx/FamHx/ROS:   Past Medical History:  Diagnosis Date   Anxiety    Fx wrist    Hypertension    Migraine    Sciatica    Thyroid disease      Past Surgical History:  Procedure Laterality Date   NO PAST SURGERIES      Family History  Problem Relation Age of Onset   Hypertension Mother    Cancer Father        colon CA   Colon cancer Father 79   Hypertension Maternal Aunt    Heart attack Maternal Grandmother    Diabetes Maternal Grandmother      Social Connections: Not on file      Current Outpatient Medications:    fluticasone (FLONASE) 50 MCG/ACT nasal spray, Place 2 sprays into both nostrils daily., Disp: 16 g, Rfl: 6   clindamycin (CLEOCIN) 300 MG capsule, Take 1 capsule (300 mg total) by mouth 3 (three) times daily. (Patient not taking: Reported on 02/03/2023), Disp: 21 capsule, Rfl: 0   losartan-hydrochlorothiazide (HYZAAR) 50-12.5 MG tablet, Take 1 tablet by mouth daily. (Patient not taking: Reported on 02/03/2023), Disp: 30 tablet, Rfl: 6   meloxicam (MOBIC) 15 MG  tablet, Take 1 tablet (15 mg total) by mouth daily. (Patient not taking: Reported on 02/03/2023), Disp: 10 tablet, Rfl: 0   pravastatin (PRAVACHOL) 10 MG tablet, Take 1 tablet (10 mg total) by mouth daily. (Patient not taking: Reported on 02/03/2023), Disp: 30 tablet, Rfl: 4   Physical Exam:   Ht 6\' 2"  (1.88 m)   Wt 173 lb (78.5 kg)   BMI 22.21 kg/m   Salient  findings:  CN II-XII intact Bilateral cerumen impaction; after clearance, Bilateral EAC clear and TM intact with well pneumatized middle ear spaces Weber 512: midline Rinne 512: AC > BC b/l  Rine 1024: AC > BC b/l  Anterior rhinoscopy: septum relatively midline; no masses No lesions of oral cavity/oropharynx; dentition fair No obviously palpable neck masses/lymphadenopathy/thyromegaly No respiratory distress or stridor  Seprately Identifiable Procedures:  Procedure: Bilateral ear microscopy and cerumen removal using microscope (CPT [phone]) - Mod 25 Pre-procedure diagnosis: bilateral Cerumen impaction bilateral external ears Post-procedure diagnosis: same Indication: bilateral cerumen impaction; given patient's otologic complaints and history as well as for improved and comprehensive examination of external ear and tympanic membrane, bilateral otologic examination using microscope was performed and impacted cerumen removed  Procedure: Patient was placed semi-recumbent. Both ear canals were examined using the microscope with findings above. Cerumen removed on left and on right using suction and currette with improvement in EAC examination and patency. Left: EAC was patent. TM was intact . Middle ear was aerated. Drainage: no Right: EAC was patent. TM was intact . Middle ear was aerated . Drainage: no Patient tolerated the procedure well.      Impression & Plans:  Caleb Heath is a 47 y.o. male with: Bilateral cerumen impaction Bilateral ear fullness - Bilateral cerumen impaction cleared today. Audiogram shows essentially normal hearing and tymps, and his fullness was improved after removal. Based on his history and symptoms, most likely attributable to cerumen impaction. We discussed cerumen management measures  - Mineral oil drops PRN  1. Sensation of fullness in both ears   2. Bilateral impacted cerumen    - f/u PRN, in case symptoms persist   Thank you for allowing me the  opportunity to care for your patient. Please do not hesitate to contact me should you have any other questions.  Sincerely, Jovita Kussmaul, MD Otolarynoglogist (ENT), [facility] Phone: [phone] Fax: [phone]  02/03/2023, 8:48 AM   I have personally spent 48 minutes involved in face-to-face and non-face-to-face activities for this patient on the day of the visit.  Professional time spent includes the following activities, in addition to those noted in the documentation: preparing to see the patient (review of outside documentation and results), performing a medically appropriate examination and/or evaluation, counseling and educating the patient/family/caregiver, ordering medications, performing procedures (cerumen disimpaction), referring and communicating with other healthcare professionals, documenting clinical information in the electronic or other health record, independently interpreting results and communicating results with the patient

## 2023-02-03 NOTE — Progress Notes (Signed)
  [address], [address] [place], [place] [address] [phone]  Audiological Evaluation    Name: Rhoderick Kormos     DOB:   02/06/1976      MRN:   [id number]                                                                                     Service Date: 02/03/2023     Accompanied by: none   Patient comes today after Dr. Allena Katz, ENT sent a referral for a hearing evaluation due to concerns with current hearing status/   Symptoms Yes Details  Hearing loss  []    Tinnitus  []    Ear pain/ Ear infections  []    Balance problems  []    Noise exposure  [x]  Occupational -some jobs he has had.  Previous ear surgeries  []    Family history  [x]  Mother reportedly do be unilateral due to meningitis  Amplification  []    Other  [x]  Wax was removed today by Dr. Allena Katz prior to the hearing test and the patient perceives an improvement.     Tympanometry: Right ear: Type A- Normal external ear canal volume with normal middle ear pressure and tympanic membrane compliance Left ear: Type A- Normal external ear canal volume with normal middle ear pressure and tympanic membrane compliance   Pure tone Audiometry: Right ear- Normal hearing from [phone].   Left ear-  Normal hearing from [phone] , then a mild presumably sensorineural hearing loss from 8000 Hz.  The hearing test results were completed under headphones and re-checked with inserts and results are deemed to be of good reliability. Test technique:  conventional     Speech Audiometry: Right ear- Speech Reception Threshold (SRT) was obtained at 15 dBHL Left ear-Speech Reception Threshold (SRT) was obtained at 15 dBHL   Word Recognition Score Tested using NU-6 (MLV) Right ear: 96% was obtained at a presentation level of 55 dBHL with contralateral masking which is deemed as  excellent Left ear: 96% was obtained at a presentation level of 55 dBHL with contralateral masking which is deemed as  excellent    Impression: There is not a  significant difference in pure-tone thresholds between ears. There is not a significant difference in the word recognition score in between ears.    Recommendations: Follow up with ENT as scheduled for today.  Return for a hearing evaluation if concerns with hearing changes arise or per MD recommendation. Use hearing protection when exposed to loud/damaging sounds.    Ladarrian Asencio MARIE LEROUX-MARTINEZ, AUD

## 2023-05-22 ENCOUNTER — Ambulatory Visit (INDEPENDENT_AMBULATORY_CARE_PROVIDER_SITE_OTHER): Payer: 59 | Admitting: Primary Care

## 2023-05-22 ENCOUNTER — Encounter (INDEPENDENT_AMBULATORY_CARE_PROVIDER_SITE_OTHER): Payer: Self-pay | Admitting: Primary Care

## 2023-05-22 ENCOUNTER — Other Ambulatory Visit: Payer: Self-pay

## 2023-05-22 VITALS — BP 142/96 | HR 99 | Wt 175.0 lb

## 2023-05-22 DIAGNOSIS — E782 Mixed hyperlipidemia: Secondary | ICD-10-CM | POA: Diagnosis not present

## 2023-05-22 DIAGNOSIS — R0602 Shortness of breath: Secondary | ICD-10-CM | POA: Diagnosis not present

## 2023-05-22 DIAGNOSIS — L259 Unspecified contact dermatitis, unspecified cause: Secondary | ICD-10-CM

## 2023-05-22 DIAGNOSIS — R1084 Generalized abdominal pain: Secondary | ICD-10-CM

## 2023-05-22 DIAGNOSIS — I10 Essential (primary) hypertension: Secondary | ICD-10-CM

## 2023-05-22 DIAGNOSIS — Z7689 Persons encountering health services in other specified circumstances: Secondary | ICD-10-CM

## 2023-05-22 MED ORDER — FLUTICASONE PROPIONATE 50 MCG/ACT NA SUSP
2.0000 | Freq: Every day | NASAL | 6 refills | Status: AC
  Filled 2023-05-22: qty 16, 30d supply, fill #0

## 2023-05-22 MED ORDER — LOSARTAN POTASSIUM-HCTZ 50-12.5 MG PO TABS
1.0000 | ORAL_TABLET | Freq: Every day | ORAL | 1 refills | Status: DC
  Filled 2023-05-22: qty 30, 30d supply, fill #0

## 2023-05-22 NOTE — Progress Notes (Signed)
 New Patient Office Visit  Subjective    Patient ID: Caleb Heath male  DOB: 09/22/1975  Age: 48 y.o. MRN: [id number]   CC:  Patient was initially in for an acute visit due to exposure to black mold.  [facility] will like to change care to [facility].  Consulted with pulmonology regarding what actual steps need to be taken for being exposed to drinking water with black mold.  Patient's current problems are increased fatigue, rash all over his body, shortness of breath, abdominal pain and having rhinitis and sinus pain for the last 7 months these problems have been present.  He was notified by his job 2 days ago about the water system filtering had mold.  Today he is concerned and would like to be evaluated and examined. Consulted with pulmonology advised May be more than one problem. May need to have need to evaluate for headache( HBP, sinus?), Breathing (Asthma, heart,anemia?) Abdominal pain( depends on details). Suggest broad labs- CBC w diff, IgE (these look for allergic response), CMET, CXR.  HPI     Abdominal Pain    Additional comments: Patient states that at work they found out there was mold in pipes and have developed hives/itchy.       Last edited by Dalene Carrow I, CMA on 05/22/2023  9:34 AM.      HPI  May be more than one problem. May need to evaluate for headache( HBP, sinus?), Breathing (Asthma, heart,anemia?) Abdominal pain( depends on details). Suggest broad labs- CBC w diff, IgE (these look for allergic response), CMET, CXR.  Current Outpatient Medications on File Prior to Visit  Medication Sig Dispense Refill   clindamycin (CLEOCIN) 300 MG capsule Take 1 capsule (300 mg total) by mouth 3 (three) times daily. (Patient not taking: Reported on 05/22/2023) 21 capsule 0   fluticasone (FLONASE) 50 MCG/ACT nasal spray Place 2 sprays into both nostrils daily. (Patient not taking: Reported on 05/22/2023) 16 g 6    losartan-hydrochlorothiazide (HYZAAR) 50-12.5 MG tablet Take 1 tablet by mouth daily. (Patient not taking: Reported on 05/22/2023) 30 tablet 6   meloxicam (MOBIC) 15 MG tablet Take 1 tablet (15 mg total) by mouth daily. (Patient not taking: Reported on 05/22/2023) 10 tablet 0   pravastatin (PRAVACHOL) 10 MG tablet Take 1 tablet (10 mg total) by mouth daily. (Patient not taking: Reported on 05/22/2023) 30 tablet 4   No current facility-administered medications on file prior to visit.     Allergies  Allergen Reactions   Amoxicillin Other (See Comments)    "I had hiccups and CP"   Banana    Bee Venom    Amlodipine Nausea Only and Other (See Comments)    drowsy    Past Medical History:  Diagnosis Date   Anxiety    Fx wrist    Hypertension    Migraine    Sciatica    Thyroid disease      Past Surgical History:  Procedure Laterality Date   NO PAST SURGERIES       Family History  Problem Relation Age of Onset   Hypertension Mother    Cancer Father        colon CA   Colon cancer Father 74   Hypertension Maternal Aunt    Heart attack Maternal Grandmother    Diabetes Maternal Grandmother     Social History   Socioeconomic History   Marital status: Single    Spouse name: Not  on file   Number of children: 4   Years of education: Not on file   Highest education level: Some college, no degree  Occupational History   Occupation: order pulling and shipping -    Comment: [employer] [place]  Tobacco Use   Smoking status: Every Day    Current packs/day: 0.50    Types: Cigarettes   Smokeless tobacco: Never   Tobacco comments:    .5 ppd  Vaping Use   Vaping status: Never Used  Substance and Sexual Activity   Alcohol use: Yes    Alcohol/week: 12.0 standard drinks of alcohol    Types: 12 Cans of beer per week    Comment: weekend   Drug use: Yes    Types: Marijuana   Sexual activity: Yes  Other Topics Concern   Not on file  Social History Narrative   Not on file    Social Drivers of Health   Financial Resource Strain: Not on file  Food Insecurity: Not on file  Transportation Needs: Not on file  Physical Activity: Not on file  Stress: Not on file  Social Connections: Not on file  Intimate Partner Violence: Not on file       Health Maintenance  Topic Date Due   Pneumococcal Vaccination (1 of 2 - PCV) Never done   Hepatitis C Screening  Never done   Colon Cancer Screening  Never done   Flu Shot  10/24/2022   COVID-19 Vaccine (1 - 2024-25 season) Never done   DTaP/Tdap/Td vaccine (2 - Td or Tdap) 09/09/2026   HIV Screening  Completed   HPV Vaccine  Aged Out    Objective    BP (!) 164/106 (BP Location: Right Arm, Patient Position: Sitting, Cuff Size: Normal)   Pulse 99   Wt 175 lb (79.4 kg)   SpO2 98%   BMI 22.47 kg/m        Assessment & Plan:  Mustaf was seen today for abdominal pain and fatigue.  Diagnoses and all orders for this visit:  Encounter to establish care  Contact dermatitis and eczema -     CBC with Differential -     IgE -     DG Chest 2 View; Future  Essential hypertension -     losartan-hydrochlorothiazide (HYZAAR) 50-12.5 MG tablet; Take 1 tablet by mouth daily.  Mixed hyperlipidemia -     Lipid Panel  Shortness of breath -     DG Chest 2 View; Future  Generalized abdominal pain -     CMP14+EGFR  Other orders -     fluticasone (FLONASE) 50 MCG/ACT nasal spray; Place 2 sprays into both nostrils daily.     Follow-up:  Schedule after labs xray reviewed  The above assessment and management plan was discussed with the patient. The patient verbalized understanding of and has agreed to the management plan. Patient is aware to call the clinic if symptoms fail to improve or worsen. Patient is aware when to return to the clinic for a follow-up visit. Patient educated on when it is appropriate to go to the emergency department.   Gwinda Passe, NP-C

## 2023-05-26 LAB — CBC WITH DIFFERENTIAL/PLATELET
Basophils Absolute: 0.1 10*3/uL (ref 0.0–0.2)
Basos: 1 %
EOS (ABSOLUTE): 0.1 10*3/uL (ref 0.0–0.4)
Eos: 1 %
Hematocrit: 46.9 % (ref 37.5–51.0)
Hemoglobin: 15.8 g/dL (ref 13.0–17.7)
Immature Grans (Abs): 0 10*3/uL (ref 0.0–0.1)
Immature Granulocytes: 0 %
Lymphocytes Absolute: 3.2 10*3/uL — ABNORMAL HIGH (ref 0.7–3.1)
Lymphs: 36 %
MCH: 31.5 pg (ref 26.6–33.0)
MCHC: 33.7 g/dL (ref 31.5–35.7)
MCV: 94 fL (ref 79–97)
Monocytes Absolute: 0.8 10*3/uL (ref 0.1–0.9)
Monocytes: 8 %
Neutrophils Absolute: 4.8 10*3/uL (ref 1.4–7.0)
Neutrophils: 54 %
Platelets: 273 10*3/uL (ref 150–450)
RBC: 5.01 x10E6/uL (ref 4.14–5.80)
RDW: 12.9 % (ref 11.6–15.4)
WBC: 8.9 10*3/uL (ref 3.4–10.8)

## 2023-05-26 LAB — LIPID PANEL
Chol/HDL Ratio: 2.2 ratio (ref 0.0–5.0)
Cholesterol, Total: 185 mg/dL (ref 100–199)
HDL: 86 mg/dL (ref >39)
LDL Chol Calc (NIH): 84 mg/dL (ref 0–99)
Triglycerides: 84 mg/dL (ref 0–149)
VLDL Cholesterol Cal: 15 mg/dL (ref 5–40)

## 2023-05-26 LAB — CMP14+EGFR
ALT: 31 IU/L (ref 0–44)
AST: 39 IU/L (ref 0–40)
Albumin: 5.3 g/dL — ABNORMAL HIGH (ref 4.1–5.1)
Alkaline Phosphatase: 84 IU/L (ref 44–121)
BUN/Creatinine Ratio: 9 (ref 9–20)
BUN: 7 mg/dL (ref 6–24)
Bilirubin Total: 0.4 mg/dL (ref 0.0–1.2)
CO2: 18 mmol/L — ABNORMAL LOW (ref 20–29)
Calcium: 10.2 mg/dL (ref 8.7–10.2)
Chloride: 99 mmol/L (ref 96–106)
Creatinine, Ser: 0.81 mg/dL (ref 0.76–1.27)
Globulin, Total: 2.3 g/dL (ref 1.5–4.5)
Glucose: 79 mg/dL (ref 70–99)
Potassium: 4.7 mmol/L (ref 3.5–5.2)
Sodium: 139 mmol/L (ref 134–144)
Total Protein: 7.6 g/dL (ref 6.0–8.5)
eGFR: 110 mL/min/{1.73_m2} (ref >59)

## 2023-05-26 LAB — IGE: IgE (Immunoglobulin E), Serum: 544 [IU]/mL — ABNORMAL HIGH (ref 6–495)

## 2023-06-05 ENCOUNTER — Encounter (INDEPENDENT_AMBULATORY_CARE_PROVIDER_SITE_OTHER): Payer: Self-pay | Admitting: Primary Care

## 2023-06-05 ENCOUNTER — Ambulatory Visit (INDEPENDENT_AMBULATORY_CARE_PROVIDER_SITE_OTHER): Payer: 59 | Admitting: Primary Care

## 2023-06-05 VITALS — BP 140/78 | HR 98

## 2023-06-05 DIAGNOSIS — I10 Essential (primary) hypertension: Secondary | ICD-10-CM | POA: Diagnosis not present

## 2023-06-05 NOTE — Progress Notes (Signed)
 Blood Pressure Recheck Visit  Name: Caleb Heath MRN: [id number] Date of Birth: 08/17/1975  Caleb Heath presents today for Blood Pressure recheck with clinical support staff.  Order for BP recheck by Caleb Heath, ordered on 06/05/2023.   BP Readings from Last 3 Encounters:  05/22/23 (!) 142/96  01/09/23 (!) 159/113  12/11/22 (!) 151/99    Current Outpatient Medications  Medication Sig Dispense Refill   fluticasone (FLONASE) 50 MCG/ACT nasal spray Place 2 sprays into both nostrils daily. 16 g 6   losartan-hydrochlorothiazide (HYZAAR) 50-12.5 MG tablet Take 1 tablet by mouth daily. 90 tablet 1   No current facility-administered medications for this visit.    Hypertensive Medication Review: Patient states that they are taking all their hypertensive medications as prescribed and their last dose of hypertensive medications was this morning   Documentation of any medication adherence discrepancies: none  Provider Recommendation:  Spoke to PCP, Caleb Heath and they stated she will discuss medication management with him. Patient was added as an OV.   Patient has been scheduled to follow up in June, 2025.  Patient has been given provider's recommendations and does not have any questions or concerns at this time. Patient will contact the office for any future questions or concerns.   Past Medical History:  Diagnosis Date   Anxiety    Fx wrist    Hypertension    Migraine    Sciatica    Thyroid disease    Past Surgical History:  Procedure Laterality Date   NO PAST SURGERIES     Allergies  Allergen Reactions   Amoxicillin Other (See Comments)    "I had hiccups and CP"   Banana    Bee Venom    Amlodipine Nausea Only and Other (See Comments)    drowsy   Current Outpatient Medications on File Prior to Visit  Medication Sig Dispense Refill   fluticasone (FLONASE) 50 MCG/ACT nasal spray Place 2 sprays into both nostrils daily. 16 g 6    losartan-hydrochlorothiazide (HYZAAR) 50-12.5 MG tablet Take 1 tablet by mouth daily. 90 tablet 1   No current facility-administered medications on file prior to visit.   Social History   Socioeconomic History   Marital status: Single    Spouse name: Not on file   Number of children: 4   Years of education: Not on file   Highest education level: Some college, no degree  Occupational History   Occupation: order pulling and shipping -    Comment: [employer]  Tobacco Use   Smoking status: Every Day    Current packs/day: 0.50    Types: Cigarettes   Smokeless tobacco: Never   Tobacco comments:    .5 ppd  Vaping Use   Vaping status: Never Used  Substance and Sexual Activity   Alcohol use: Yes    Alcohol/week: 12.0 standard drinks of alcohol    Types: 12 Cans of beer per week    Comment: weekend   Drug use: Yes    Types: Marijuana   Sexual activity: Yes  Other Topics Concern   Not on file  Social History Narrative   Not on file   Social Drivers of Health   Financial Resource Strain: Not on file  Food Insecurity: Not on file  Transportation Needs: Not on file  Physical Activity: Not on file  Stress: Not on file  Social Connections: Not on file  Intimate Partner Violence: Not on file   Family History  Problem Relation Age of  Onset   Hypertension Mother    Cancer Father        colon CA   Colon cancer Father 23   Hypertension Maternal Aunt    Heart attack Maternal Grandmother    Diabetes Maternal Grandmother    Health Maintenance  Topic Date Due   Pneumococcal Vaccine 78-31 Years old (1 of 2 - PCV) Never done   Hepatitis C Screening  Never done   Colonoscopy  Never done   INFLUENZA VACCINE  10/24/2022   COVID-19 Vaccine (1 - 2024-25 season) Never done   DTaP/Tdap/Td (2 - Td or Tdap) 09/09/2026   HIV Screening  Completed   HPV VACCINES  Aged Out     OBJECTIVE:  Vitals:   06/05/23 1011 06/05/23 1026 06/05/23 1042  BP: (!) 161/108 (!) 155/102 (!)  140/78  Pulse: 98    SpO2: 100%      Physical Exam Vitals reviewed.  HENT:     Head: Normocephalic.     Right Ear: Tympanic membrane and external ear normal.     Left Ear: Tympanic membrane and external ear normal.     Nose: Nose normal.  Eyes:     Extraocular Movements: Extraocular movements intact.     Pupils: Pupils are equal, round, and reactive to light.  Cardiovascular:     Rate and Rhythm: Normal rate and regular rhythm.  Pulmonary:     Effort: Pulmonary effort is normal.     Breath sounds: Normal breath sounds.  Abdominal:     General: Bowel sounds are normal. There is distension.     Palpations: Abdomen is soft.  Musculoskeletal:        General: Normal range of motion.  Skin:    General: Skin is warm and dry.  Neurological:     Mental Status: He is oriented to person, place, and time.  Psychiatric:        Mood and Affect: Mood normal.        Behavior: Behavior normal.        Thought Content: Thought content normal.        Judgment: Judgment normal.      ROS  Last 3 Office BP readings: BP Readings from Last 3 Encounters:  06/05/23 (!) 140/78  05/22/23 (!) 142/96  01/09/23 (!) 159/113    BMET    Component Value Date/Time   NA 139 05/22/2023 1054   K 4.7 05/22/2023 1054   CL 99 05/22/2023 1054   CO2 18 (L) 05/22/2023 1054   GLUCOSE 79 05/22/2023 1054   GLUCOSE 102 (H) 03/12/2021 1707   BUN 7 05/22/2023 1054   CREATININE 0.81 05/22/2023 1054   CREATININE 0.78 08/16/2015 1021   CALCIUM 10.2 05/22/2023 1054   GFRNONAA >60 03/12/2021 1707   GFRNONAA >89 08/16/2015 1021   GFRAA 123 08/04/2018 1000   GFRAA >89 08/16/2015 1021    Renal function: CrCl cannot be calculated (Unknown ideal weight.).  Clinical ASCVD: Yes  The 10-year ASCVD risk score (Arnett DK, et al., 2019) is: 11.9%   Values used to calculate the score:     Age: 23 years     Sex: Male     Is Non-Hispanic African American: Yes     Diabetic: No     Tobacco smoker: Yes      Systolic Blood Pressure: 140 mmHg     Is BP treated: Yes     HDL Cholesterol: 86 mg/dL     Total Cholesterol: 185 mg/dL  ASCVD risk  factors include- Caleb Heath   ASSESSMENT & PLAN:  1. Essential hypertension   -Counseled on lifestyle modifications for blood pressure control including reduced dietary sodium, increased exercise, weight reduction and adequate sleep. Also, educated patient about the risk for cardiovascular events, stroke and heart attack. Also counseled patient about the importance of medication adherence. If you participate in smoking, it is important to stop using tobacco as this will increase the risks associated with uncontrolled blood pressure.    Goal BP:  For patients younger than 60: Goal BP < 130/80. For patients 60 and older: Goal BP < 140/90. For patients with diabetes: Goal BP < 130/80. Your most recent BP: 140/78   Minimize salt intake. Minimize alcohol intake    This note has been created with Education officer, environmental. Any transcriptional errors are unintentional.   Grayce Sessions, NP 06/08/2023, 3:41 PM

## 2023-07-28 ENCOUNTER — Encounter (HOSPITAL_BASED_OUTPATIENT_CLINIC_OR_DEPARTMENT_OTHER): Payer: Self-pay | Admitting: *Deleted

## 2023-07-28 ENCOUNTER — Emergency Department (HOSPITAL_BASED_OUTPATIENT_CLINIC_OR_DEPARTMENT_OTHER)
Admission: EM | Admit: 2023-07-28 | Discharge: 2023-07-28 | Disposition: A | Discharge status: Home / Self Care | Attending: Emergency Medicine | Admitting: Emergency Medicine

## 2023-07-28 ENCOUNTER — Other Ambulatory Visit (HOSPITAL_BASED_OUTPATIENT_CLINIC_OR_DEPARTMENT_OTHER): Payer: Self-pay

## 2023-07-28 ENCOUNTER — Other Ambulatory Visit: Payer: Self-pay

## 2023-07-28 DIAGNOSIS — H6121 Impacted cerumen, right ear: Secondary | ICD-10-CM | POA: Diagnosis not present

## 2023-07-28 DIAGNOSIS — H9201 Otalgia, right ear: Secondary | ICD-10-CM | POA: Diagnosis present

## 2023-07-28 DIAGNOSIS — H6690 Otitis media, unspecified, unspecified ear: Secondary | ICD-10-CM

## 2023-07-28 DIAGNOSIS — I10 Essential (primary) hypertension: Secondary | ICD-10-CM | POA: Diagnosis not present

## 2023-07-28 DIAGNOSIS — H6691 Otitis media, unspecified, right ear: Secondary | ICD-10-CM | POA: Insufficient documentation

## 2023-07-28 DIAGNOSIS — Z79899 Other long term (current) drug therapy: Secondary | ICD-10-CM | POA: Insufficient documentation

## 2023-07-28 MED ORDER — AZITHROMYCIN 250 MG PO TABS
250.0000 mg | ORAL_TABLET | Freq: Every day | ORAL | 0 refills | Status: DC
  Filled 2023-07-28: qty 6, 6d supply, fill #0
  Filled 2023-07-28: qty 6, 5d supply, fill #0

## 2023-07-28 NOTE — ED Provider Notes (Signed)
 [place] EMERGENCY DEPARTMENT AT [facility] Provider Note   CSN: [id number] Arrival date & time: 07/28/23  1250     History  Chief Complaint  Patient presents with   Otalgia    Caleb Heath is a 48 y.o. male.  Patient with past medical history of hypertension, seasonal allergies, migraine.  He is presenting with complaint of right ear pain associated with muffled hearing for 1 week, seems to be getting worse. Tried debrox drop, flushed LEFT ear but unsuccessful with right ear.  Has not noticed any pain of external ear or drainage.  Denies any significant fever or other URI-like symptoms.  Otalgia      Home Medications Prior to Admission medications   Medication Sig Start Date End Date Taking? Authorizing Provider  fluticasone  (FLONASE ) 50 MCG/ACT nasal spray Place 2 sprays into both nostrils daily. 05/22/23   Marius Siemens, NP  losartan -hydrochlorothiazide  (HYZAAR ) 50-12.5 MG tablet Take 1 tablet by mouth daily. 05/22/23   Marius Siemens, NP      Allergies    Amoxicillin , Banana, Bee venom, and Amlodipine     Review of Systems   Review of Systems  HENT:  Positive for ear pain.     Physical Exam Updated Vital Signs BP (!) 187/119 (BP Location: Right Arm)   Pulse (!) 102   Temp 98.6 F (37 C)   Resp 16   Ht 6\' 2"  (1.88 m)   Wt 79.4 kg   SpO2 100%   BMI 22.47 kg/m  Physical Exam Vitals and nursing note reviewed.  Constitutional:      General: He is not in acute distress.    Appearance: He is not toxic-appearing.  HENT:     Head: Normocephalic and atraumatic.     Right Ear: There is impacted cerumen.     Left Ear: There is no impacted cerumen.  Eyes:     General: No scleral icterus.    Conjunctiva/sclera: Conjunctivae normal.  Cardiovascular:     Rate and Rhythm: Normal rate and regular rhythm.     Pulses: Normal pulses.     Heart sounds: Normal heart sounds.  Pulmonary:     Effort: Pulmonary effort is normal. No respiratory distress.      Breath sounds: Normal breath sounds.  Abdominal:     General: Abdomen is flat. Bowel sounds are normal.     Palpations: Abdomen is soft.     Tenderness: There is no abdominal tenderness.  Musculoskeletal:     Right lower leg: No edema.     Left lower leg: No edema.  Skin:    General: Skin is warm and dry.     Findings: No lesion.  Neurological:     General: No focal deficit present.     Mental Status: He is alert and oriented to person, place, and time. Mental status is at baseline.     ED Results / Procedures / Treatments   Labs (all labs ordered are listed, but only abnormal results are displayed) Labs Reviewed - No data to display  EKG None  Radiology No results found.  Procedures Procedures    Medications Ordered in ED Medications - No data to display  ED Course/ Medical Decision Making/ A&P                                 Medical Decision Making Risk Prescription drug management.   Randalyn Bushman 48 y.o. presented  today for URI like symptoms. Working DDx that I considered at this time includes, but not limited to, viral illness, pharyngitis, mono, sinusitis, electrolyte abnormality, AOM.  R/o DDx: these additional diagnoses are not consistent with patient's history, presentation, physical exam, labs/imaging findings.    Problem List / ED Course / Critical interventions / Medication management  Patient presenting with complaint of right sided ear pain ongoing for a week.  He has not had any runny nose, has sore throat or other symptoms.  He is hemodynamically stable and well-appearing.  Has noted some muffled hearing associated with his right ear.  He has history of otitis media and also cerumen impaction.  He is trying to get into ENT.  On my exam his left TM is easily visualized with no acute abnormality.  Right TM is not visualized secondary to cerumen impaction.  He has no obvious drainage from his ear or erythema of this ear.  Will treat for AOM with  antibiotic if in significant discomfort and concern for similar presenting in the past requiring abx.  Will have him follow-up with ENT.  Feel he stable for discharge.  Discussed elevated blood pressure reading. Given return precautions.   Patients vitals assessed. Upon arrival patient is hemodynamically stable.  I have reviewed the patients home medicines and have made adjustments as needed     Plan:  F/u w/ PCP in 2-3d to ensure resolution of sx.  Patient was given return precautions. Patient stable for discharge at this time.  Patient educated on sx and dx and verbalized understanding of plan. Return to ER if new or worsening sx.          Final Clinical Impression(s) / ED Diagnoses Final diagnoses:  Acute otitis media, unspecified otitis media type  Right ear impacted cerumen    Rx / DC Orders ED Discharge Orders     None         Eudora Heron, PA-C 07/28/23 1707    Rolinda Climes, DO 07/29/23 1550

## 2023-07-28 NOTE — ED Notes (Signed)
 No further questions. Pt verbalized understanding of discharge.

## 2023-07-28 NOTE — ED Triage Notes (Signed)
 Pt to ED reporting right ear pain with loss of hearing x 1 week.   No fever, cough, congestion.

## 2023-07-28 NOTE — Discharge Instructions (Addendum)
 I have sent an antibiotic take 2 pills today, then take 1 pill daily until gone.   Please call your ENT doctor, if you can not see them you can try calling the ENT doctor attached to discharge paperwork or follow up with primary care doctor.

## 2023-07-31 ENCOUNTER — Ambulatory Visit (INDEPENDENT_AMBULATORY_CARE_PROVIDER_SITE_OTHER): Admitting: Otolaryngology

## 2023-07-31 ENCOUNTER — Encounter (INDEPENDENT_AMBULATORY_CARE_PROVIDER_SITE_OTHER): Payer: Self-pay | Admitting: Otolaryngology

## 2023-07-31 VITALS — BP 151/95 | HR 99 | Ht 74.0 in | Wt 173.0 lb

## 2023-07-31 DIAGNOSIS — H938X1 Other specified disorders of right ear: Secondary | ICD-10-CM

## 2023-07-31 DIAGNOSIS — H6121 Impacted cerumen, right ear: Secondary | ICD-10-CM

## 2023-07-31 NOTE — Progress Notes (Signed)
 Dear Dr. Denece Finger, Here is my assessment for our mutual patient, Caleb Heath. Thank you for allowing me the opportunity to care for your patient. Please do not hesitate to contact me should you have any other questions. Sincerely, Dr. Milon Aloe  Otolaryngology Clinic Note Referring provider: Dr. Denece Finger HPI:  Caleb Heath is a 48 y.o. male kindly referred by Dr. Denece Finger for evaluation of bilateral ear fullness - intermittent.  Initial visit (2024): Patient reports: main complaint is ear fullness bilaterally which is uncomfortable. Sometimes he can't hear and will have to use Debrox/flushing which helps. Sometimes waxy drainage comes out. Right now no complaints, but just comes and goes.  Patient currently denies: ear pain, fullness, vertigo Patient additionally denies: deep pain in ear canal, eustachian tube symptoms such as popping, crackling, sensitive to pressure changes Patient also denies barotrauma, vestibular suppressant use, ototoxic medication use Prior ear surgery: no Never had problems with ears before Some occupational noise exposure Query maternal HL 2/2 meningitis  He's had multiple ED visits for this, and with multiple ear irrigations and used debros (see below) and multiple rounds of abx. Most recently on ofloxacin   --------------------------------------------------------- 07/31/2023 Presents for follow up visit. He reports that he had acute onset fullness and muffled hearing about 2 weeks ago. Went to ED where he was diagnosed with acute otitis media and placed on abx and drops. He reports that his left ear is without issue. No antecedent URI, otherwise feels well. No pain in the ear, no drainage. He is also using flonase , abx, and the drops he was prescribed   PMHx: HTN, Mood disorder, Migraines, Smoking H&N Surgery: no Personal or FHx of bleeding dz or anesthesia difficulty: no  AP/AC: no  Occupation: Physiological scientist. Lives in [place]   Independent  Review of Additional Tests or Records:  Primary care and ED notes reviewed: multiple ED visits for ear fullness in 2023 and 2024 (prescribed zithromax , flonase  2023 late); Nasal abscess 07/2022, Rx with bactrim . 11/2022: Seen in UC for ear fullness and pain and hearing loss. Irrigated ears prior, now with recurrent symptoms. Used debrox, no improvement. Noted to have purulent drainage left ear, started on Bactrim  12/2022: UC - States he feels like his ears are clogged with decreased hearing bilaterally. He has been careful and has not gotten water into the ears and has not used Q-tips since last visit. Started on ofloxacin   CTH (2015): Mastoids and ME well aerated   02/03/2023 Audiogram was independently reviewed and interpreted by me and it reveals Right ear: normal hearing thresholds; 96% word interpretation at 55dB; type A tympanogram Left ear: nl hearing thresholds with mild likely SNHL loss at 8000 Hz; 96% word interpretation at 55dB; type A tympanogram  SNHL= Sensorineural hearing loss  ED visit (07/28/2023): Noted right ear pain, noted cerumen impaction; Dx: AOM; Rx: azithromycin    PMH/Meds/All/SocHx/FamHx/ROS:   Past Medical History:  Diagnosis Date   Anxiety    Fx wrist    Hypertension    Migraine    Sciatica    Thyroid  disease      Past Surgical History:  Procedure Laterality Date   NO PAST SURGERIES      Family History  Problem Relation Age of Onset   Hypertension Mother    Cancer Father        colon CA   Colon cancer Father 31   Hypertension Maternal Aunt    Heart attack Maternal Grandmother    Diabetes Maternal Grandmother      Social  Connections: Not on file      Current Outpatient Medications:    azithromycin  (ZITHROMAX ) 250 MG tablet, Take first 2 tablets together, then 1 every day until finished., Disp: 6 tablet, Rfl: 0   fluticasone  (FLONASE ) 50 MCG/ACT nasal spray, Place 2 sprays into both nostrils daily., Disp: 16 g, Rfl: 6    losartan -hydrochlorothiazide  (HYZAAR ) 50-12.5 MG tablet, Take 1 tablet by mouth daily., Disp: 90 tablet, Rfl: 1   Physical Exam:   BP (!) 151/95 (BP Location: Left Arm, Patient Position: Sitting, Cuff Size: Normal)   Pulse 99   Ht 6\' 2"  (1.88 m)   Wt 173 lb (78.5 kg)   SpO2 97%   BMI 22.21 kg/m   Salient findings:  CN II-XII intact Right cerumen impaction; after clearance, Bilateral EAC clear and TM intact with well pneumatized middle ear spaces; no evidence of infection Weber 512: midline Rinne 512: AC > BC b/l  Rine 1024: AC > BC b/l  Anterior rhinoscopy: septum relatively midline; no masses No respiratory distress or stridor  Seprately Identifiable Procedures:  Procedure: Bilateral ear microscopy and cerumen removal using microscope (CPT [phone]) - Mod 25 Pre-procedure diagnosis: Right Cerumen impaction right external ears Post-procedure diagnosis: same Indication: right cerumen impaction; given patient's otologic complaints and history as well as for improved and comprehensive examination of external ear and tympanic membrane, bilateral otologic examination using microscope was performed and impacted cerumen removed. Prior to proceeding, verbal consent was obtained  Procedure: Patient was placed semi-recumbent. Both ear canals were examined using the microscope with findings above. Impacted Cerumen removed on right using suction and currette with improvement in EAC examination and patency. Patient tolerated procedure well      Impression & Plans:  Caleb Heath is a 48 y.o. male with:  1. Ear fullness, right   2. Impacted cerumen of right ear    - Right cerumen impaction cleared today with immediate improvement; prior audio reassuring; He denies any antecedent URI or ear pain sx or sudden complete hearing loss As such, will have him discontinue azithromycin  250mg  F/u PRN - certainly if sx worsen or do not improve Ceruminosis strategies again discussed   Thank you for  allowing me the opportunity to care for your patient. Please do not hesitate to contact me should you have any other questions.  Sincerely, Milon Aloe, MD Otolaryngologist (ENT), [facility] Phone: [phone] Fax: [phone]  07/31/2023, 1:27 PM   I have personally spent 30 minutes involved in face-to-face and non-face-to-face activities for this patient on the day of the visit.  Professional time spent excludes any procedures performed but includes the following activities, in addition to those noted in the documentation: preparing to see the patient (review of outside documentation and results), performing a medically appropriate examination, counseling, documenting in the electronic health record

## 2023-09-01 ENCOUNTER — Telehealth (INDEPENDENT_AMBULATORY_CARE_PROVIDER_SITE_OTHER): Payer: Self-pay | Admitting: Primary Care

## 2023-09-01 NOTE — Telephone Encounter (Signed)
 Spoke to pt about upcoming appt.. Will be present

## 2023-09-08 ENCOUNTER — Ambulatory Visit (INDEPENDENT_AMBULATORY_CARE_PROVIDER_SITE_OTHER): Admitting: Primary Care

## 2023-09-08 ENCOUNTER — Encounter (INDEPENDENT_AMBULATORY_CARE_PROVIDER_SITE_OTHER): Payer: Self-pay | Admitting: Primary Care

## 2023-09-08 VITALS — BP 148/92 | HR 111 | Resp 16 | Wt 174.0 lb

## 2023-09-08 DIAGNOSIS — I10 Essential (primary) hypertension: Secondary | ICD-10-CM | POA: Diagnosis not present

## 2023-09-08 DIAGNOSIS — Z1211 Encounter for screening for malignant neoplasm of colon: Secondary | ICD-10-CM | POA: Diagnosis not present

## 2023-09-08 DIAGNOSIS — Z1159 Encounter for screening for other viral diseases: Secondary | ICD-10-CM

## 2023-09-08 DIAGNOSIS — Z2821 Immunization not carried out because of patient refusal: Secondary | ICD-10-CM | POA: Diagnosis not present

## 2023-09-08 MED ORDER — CLONIDINE HCL 0.1 MG PO TABS: 0.1000 mg | ORAL_TABLET | Freq: Once | ORAL | Status: DC

## 2023-09-08 MED ORDER — CLONIDINE HCL 0.1 MG PO TABS: 0.1000 mg | ORAL_TABLET | Freq: Once | ORAL | Status: AC

## 2023-09-08 NOTE — Progress Notes (Signed)
 Subjective:   Caleb Heath is a 48 y.o. male presents for management of comorbidities hypertension -Patient has No headache, No chest pain, No abdominal pain - No Nausea, No new weakness tingling or numbness, No Cough - shortness of breath.  Patient does admit that sometimes he does forget to take his medication but it is not on a continuous basis.  Suggested pill box.  Thyroid  disease denies fatigue, weight gain, dry hair/skin, constipation, swelling, decreased memory/brain fog.  He is currently on no medication we will check levels.  Patient also informs me he went to the emergency room on Jul 28, 2023 Acute otitis media, unspecified otitis media type and right ear impaction.  He tried treatment at home with Debrox and flushing not successful in the right ear but the left ear sounded muffled he was treated with antibiotics and feeling better. Past Medical History:  Diagnosis Date   Anxiety    Fx wrist    Hypertension    Migraine    Sciatica    Thyroid  disease      Allergies  Allergen Reactions   Amoxicillin  Other (See Comments)    I had hiccups and CP   Banana    Bee Venom    Amlodipine  Nausea Only and Other (See Comments)    drowsy    Current Outpatient Medications on File Prior to Visit  Medication Sig Dispense Refill   fluticasone  (FLONASE ) 50 MCG/ACT nasal spray Place 2 sprays into both nostrils daily. 16 g 6   losartan -hydrochlorothiazide  (HYZAAR ) 50-12.5 MG tablet Take 1 tablet by mouth daily. 90 tablet 1   azithromycin  (ZITHROMAX ) 250 MG tablet Take first 2 tablets together, then 1 every day until finished. 6 tablet 0   No current facility-administered medications on file prior to visit.    Review of System: ROS Comprehensive ROS Pertinent positive and negative noted in HPI   Objective:  BP (!) 148/92 (Cuff Size: Normal)   Pulse (!) 111   Resp 16   Wt 174 lb (78.9 kg)   SpO2 100%   BMI 22.34 kg/m   Filed Weights   09/08/23 1004  Weight: 174 lb (78.9 kg)     Physical Exam Vitals reviewed.  Constitutional:      Appearance: Normal appearance.  HENT:     Head: Normocephalic.     Right Ear: Tympanic membrane, ear canal and external ear normal.     Left Ear: Tympanic membrane, ear canal and external ear normal.     Nose: Nose normal.   Eyes:     Extraocular Movements: Extraocular movements intact.     Pupils: Pupils are equal, round, and reactive to light.    Cardiovascular:     Rate and Rhythm: Normal rate and regular rhythm.  Pulmonary:     Effort: Pulmonary effort is normal.     Breath sounds: Normal breath sounds.  Abdominal:     General: Bowel sounds are normal.     Palpations: Abdomen is soft.   Musculoskeletal:        General: Normal range of motion.     Cervical back: Normal range of motion.   Skin:    General: Skin is warm and dry.     Findings: Rash present.   Neurological:     Mental Status: He is alert and oriented to person, place, and time.   Psychiatric:        Mood and Affect: Mood normal.        Behavior: Behavior normal.  Thought Content: Thought content normal.        Judgment: Judgment normal.      Assessment:  Pneumococcal vaccination declined  Screening for colon cancer -     Ambulatory referral to Gastroenterology  Essential hypertension BP goal - <140/90  explained that having normal blood pressure is the goal and medications are helping to get to goal and maintain normal blood pressure. DIET: Limit salt intake, read nutrition labels to check salt content, limit fried and high fatty foods  Avoid using multisymptom OTC cold preparations that generally contain sudafed which can rise BP. Consult with pharmacist on best cold relief products to use for persons with HTN EXERCISE Discussed incorporating exercise such as walking - 30 minutes most days of the week and can do in 10 minute intervals    -     CMP14+EGFR -     cloNIDine  HCl -     cloNIDine  HCl  Need for hepatitis C screening  test -     HCV Ab w Reflex to Quant PCR  Other orders -     Interpretation:    This note has been created with Education officer, environmental. Any transcriptional errors are unintentional.   Return in about 3 months (around 12/09/2023) for re-check blood pressure.  Caleb SHAUNNA Bohr, NP 09/15/2023, 1:46 AM

## 2023-09-09 LAB — CMP14+EGFR
ALT: 21 IU/L (ref 0–44)
AST: 29 IU/L (ref 0–40)
Albumin: 5.1 g/dL (ref 4.1–5.1)
Alkaline Phosphatase: 84 IU/L (ref 44–121)
BUN/Creatinine Ratio: 11 (ref 9–20)
BUN: 8 mg/dL (ref 6–24)
Bilirubin Total: 0.2 mg/dL (ref 0.0–1.2)
CO2: 21 mmol/L (ref 20–29)
Calcium: 9.9 mg/dL (ref 8.7–10.2)
Chloride: 99 mmol/L (ref 96–106)
Creatinine, Ser: 0.7 mg/dL — ABNORMAL LOW (ref 0.76–1.27)
Globulin, Total: 2.2 g/dL (ref 1.5–4.5)
Glucose: 52 mg/dL — ABNORMAL LOW (ref 70–99)
Potassium: 4.5 mmol/L (ref 3.5–5.2)
Sodium: 140 mmol/L (ref 134–144)
Total Protein: 7.3 g/dL (ref 6.0–8.5)
eGFR: 115 mL/min/{1.73_m2} (ref >59)

## 2023-09-09 LAB — HCV AB W REFLEX TO QUANT PCR: HCV Ab: NONREACTIVE

## 2023-09-09 LAB — HCV INTERPRETATION

## 2023-09-15 ENCOUNTER — Ambulatory Visit (INDEPENDENT_AMBULATORY_CARE_PROVIDER_SITE_OTHER): Payer: Self-pay | Admitting: Primary Care

## 2023-09-16 ENCOUNTER — Telehealth (INDEPENDENT_AMBULATORY_CARE_PROVIDER_SITE_OTHER): Payer: Self-pay | Admitting: Primary Care

## 2023-09-16 NOTE — Telephone Encounter (Signed)
 Spoke to pt about upcoming appt.. Will be present

## 2023-09-23 ENCOUNTER — Other Ambulatory Visit: Payer: Self-pay

## 2023-09-23 ENCOUNTER — Encounter (INDEPENDENT_AMBULATORY_CARE_PROVIDER_SITE_OTHER): Payer: Self-pay | Admitting: Primary Care

## 2023-09-23 ENCOUNTER — Ambulatory Visit (INDEPENDENT_AMBULATORY_CARE_PROVIDER_SITE_OTHER): Admitting: Primary Care

## 2023-09-23 DIAGNOSIS — I10 Essential (primary) hypertension: Secondary | ICD-10-CM | POA: Diagnosis not present

## 2023-09-23 MED ORDER — LOSARTAN POTASSIUM-HCTZ 100-25 MG PO TABS
1.0000 | ORAL_TABLET | Freq: Every day | ORAL | 1 refills | Status: AC
  Filled 2023-09-23: qty 30, 30d supply, fill #0

## 2023-09-23 MED ORDER — CARVEDILOL 12.5 MG PO TABS
12.5000 mg | ORAL_TABLET | Freq: Two times a day (BID) | ORAL | 1 refills | Status: AC
  Filled 2023-09-23: qty 60, 30d supply, fill #0

## 2023-09-23 NOTE — Patient Instructions (Addendum)

## 2023-09-23 NOTE — Progress Notes (Signed)
 [facility]   Caleb Heath is a 48 y.o. male presents for hypertension evaluation, Denies shortness of breath, headaches, chest pain or lower extremity edema, sudden onset, vision changes, unilateral weakness, dizziness, paresthesias    Past Medical History:  Diagnosis Date   Anxiety    Fx wrist    Hypertension    Migraine    Sciatica    Thyroid  disease    Past Surgical History:  Procedure Laterality Date   NO PAST SURGERIES     Allergies  Allergen Reactions   Amoxicillin  Other (See Comments)    I had hiccups and CP   Banana    Bee Venom    Amlodipine  Nausea Only and Other (See Comments)    drowsy   Current Outpatient Medications on File Prior to Visit  Medication Sig Dispense Refill   fluticasone  (FLONASE ) 50 MCG/ACT nasal spray Place 2 sprays into both nostrils daily. 16 g 6   Current Facility-Administered Medications on File Prior to Visit  Medication Dose Route Frequency Provider Last Rate Last Admin   cloNIDine  (CATAPRES ) tablet 0.1 mg  0.1 mg Oral Once Celestia Rosaline SQUIBB, NP       Social History   Socioeconomic History   Marital status: Single    Spouse name: Not on file   Number of children: 4   Years of education: Not on file   Highest education level: Some college, no degree  Occupational History   Occupation: order pulling and shipping -    Comment: [employer] [place]  Tobacco Use   Smoking status: Every Day    Current packs/day: 0.50    Types: Cigarettes   Smokeless tobacco: Never   Tobacco comments:    .5 ppd  Vaping Use   Vaping status: Never Used  Substance and Sexual Activity   Alcohol use: Yes    Alcohol/week: 12.0 standard drinks of alcohol    Types: 12 Cans of beer per week    Comment: weekend   Drug use: Yes    Types: Marijuana   Sexual activity: Yes  Other Topics Concern   Not on file  Social History Narrative   Not on file   Social Drivers of Health   Financial Resource Strain: Not on file  Food  Insecurity: No Food Insecurity (09/08/2023)   Hunger Vital Sign    Worried About Running Out of Food in the Last Year: Never true    Ran Out of Food in the Last Year: Never true  Transportation Needs: No Transportation Needs (09/08/2023)   PRAPARE - Administrator, Civil Service (Medical): No    Lack of Transportation (Non-Medical): No  Physical Activity: Not on file  Stress: Not on file  Social Connections: Not on file  Intimate Partner Violence: Not At Risk (09/08/2023)   Humiliation, Afraid, Rape, and Kick questionnaire    Fear of Current or Ex-Partner: No    Emotionally Abused: No    Physically Abused: No    Sexually Abused: No   Family History  Problem Relation Age of Onset   Hypertension Mother    Cancer Father        colon CA   Colon cancer Father 58   Hypertension Maternal Aunt    Heart attack Maternal Grandmother    Diabetes Maternal Grandmother    Health Maintenance  Topic Date Due   Hepatitis B Vaccines (1 of 3 - 19+ 3-dose series) Never done   Colonoscopy  Never done   COVID-19  Vaccine (1 - 2024-25 season) Never done   Pneumococcal Vaccine 85-40 Years old (1 of 2 - PCV) 09/07/2024 (Originally 10/31/1995)   INFLUENZA VACCINE  10/24/2023   DTaP/Tdap/Td (2 - Td or Tdap) 09/09/2026   Hepatitis C Screening  Completed   HIV Screening  Completed   HPV VACCINES  Aged Out   Meningococcal B Vaccine  Aged Out     OBJECTIVE:  Vitals:   09/23/23 1050 09/23/23 1051  BP: (!) 187/127 (!) 159/106  Pulse: 68   Resp: 16   SpO2: 100%   Weight: 171 lb (77.6 kg)     Physical Exam Vitals reviewed.  Constitutional:      Appearance: Normal appearance. He is normal weight.  HENT:     Head: Normocephalic.     Right Ear: Tympanic membrane and external ear normal.     Left Ear: Tympanic membrane and external ear normal.     Nose: Nose normal.  Eyes:     Extraocular Movements: Extraocular movements intact.     Pupils: Pupils are equal, round, and reactive to  light.  Cardiovascular:     Rate and Rhythm: Normal rate and regular rhythm.  Pulmonary:     Effort: Pulmonary effort is normal.     Breath sounds: Normal breath sounds.  Abdominal:     General: Bowel sounds are normal. There is distension.     Palpations: Abdomen is soft.  Musculoskeletal:        General: Normal range of motion.  Skin:    General: Skin is warm and dry.  Neurological:     Mental Status: He is oriented to person, place, and time.  Psychiatric:        Mood and Affect: Mood normal.        Behavior: Behavior normal.        Thought Content: Thought content normal.        Judgment: Judgment normal.      ROS  Last 3 Office BP readings: BP Readings from Last 3 Encounters:  09/23/23 (!) 159/106  09/08/23 (!) 148/92  07/31/23 (!) 151/95    BMET    Component Value Date/Time   NA 140 09/08/2023 1047   K 4.5 09/08/2023 1047   CL 99 09/08/2023 1047   CO2 21 09/08/2023 1047   GLUCOSE 52 (L) 09/08/2023 1047   GLUCOSE 102 (H) 03/12/2021 1707   BUN 8 09/08/2023 1047   CREATININE 0.70 (L) 09/08/2023 1047   CREATININE 0.78 08/16/2015 1021   CALCIUM 9.9 09/08/2023 1047   GFRNONAA >60 03/12/2021 1707   GFRNONAA >89 08/16/2015 1021   GFRAA 123 08/04/2018 1000   GFRAA >89 08/16/2015 1021    Renal function: Estimated Creatinine Clearance: 126.6 mL/min (A) (by C-G formula based on SCr of 0.7 mg/dL (L)).  Clinical ASCVD: Yes  The 10-year ASCVD risk score (Arnett DK, et al., 2019) is: 15%   Values used to calculate the score:     Age: 69 years     Clincally relevant sex: Male     Is Non-Hispanic African American: Yes     Diabetic: No     Tobacco smoker: Yes     Systolic Blood Pressure: 159 mmHg     Is BP treated: Yes     HDL Cholesterol: 86 mg/dL     Total Cholesterol: 185 mg/dL  ASCVD risk factors include- [place]   ASSESSMENT & PLAN: Caleb Heath was seen today for blood pressure check.  Diagnoses and all orders for this  visit:  Essential hypertension    -Counseled on lifestyle modifications for blood pressure control including reduced dietary sodium, increased exercise, weight reduction and adequate sleep. Also, educated patient about the risk for cardiovascular events, stroke and heart attack. Also counseled patient about the importance of medication adherence. If you participate in smoking, it is important to stop using tobacco as this will increase the risks associated with uncontrolled blood pressure.   -Goal BP:  For patients younger than 60: Goal BP < 130/80. For patients 60 and older: Goal BP < 140/90. For patients with diabetes: Goal BP < 130/80. Your most recent BP: 160/104  Minimize salt intake. Minimize alcohol intake    Other orders -     losartan -hydrochlorothiazide  (HYZAAR ) 100-25 MG tablet; Take 1 tablet by mouth daily. -     carvedilol  (COREG ) 12.5 MG tablet; Take 1 tablet (12.5 mg total) by mouth 2 (two) times daily with a meal.  This note has been created with Education officer, environmental. Any transcriptional errors are unintentional.   [name], NP 09/23/2023, 11:33 AM

## 2023-10-02 ENCOUNTER — Encounter: Payer: Self-pay | Admitting: Primary Care

## 2023-10-03 ENCOUNTER — Other Ambulatory Visit: Payer: Self-pay

## 2023-10-20 ENCOUNTER — Telehealth (INDEPENDENT_AMBULATORY_CARE_PROVIDER_SITE_OTHER): Payer: Self-pay | Admitting: Primary Care

## 2023-10-20 NOTE — Telephone Encounter (Signed)
 Called pt to confirm appt. Pt did not answer and LVM

## 2023-10-21 ENCOUNTER — Ambulatory Visit (INDEPENDENT_AMBULATORY_CARE_PROVIDER_SITE_OTHER): Admitting: Primary Care

## 2023-11-21 ENCOUNTER — Telehealth: Payer: Self-pay | Admitting: Primary Care

## 2023-11-21 NOTE — Telephone Encounter (Signed)
 Called patient, no answer. Left voicemail confirming upcoming appointment on 11/25/2023 Provided callback number for any questions or changes.

## 2023-11-25 ENCOUNTER — Ambulatory Visit: Payer: Self-pay | Admitting: Pharmacist

## 2023-11-26 ENCOUNTER — Encounter: Payer: Self-pay | Admitting: Primary Care

## 2023-12-09 ENCOUNTER — Ambulatory Visit (INDEPENDENT_AMBULATORY_CARE_PROVIDER_SITE_OTHER): Payer: Self-pay | Admitting: Primary Care

## 2024-01-05 ENCOUNTER — Other Ambulatory Visit: Payer: Self-pay

## 2024-01-05 ENCOUNTER — Emergency Department (HOSPITAL_BASED_OUTPATIENT_CLINIC_OR_DEPARTMENT_OTHER)
Admission: EM | Admit: 2024-01-05 | Discharge: 2024-01-05 | Discharge status: Against Medical Advice | Attending: Emergency Medicine | Admitting: Emergency Medicine

## 2024-01-05 DIAGNOSIS — L089 Local infection of the skin and subcutaneous tissue, unspecified: Secondary | ICD-10-CM | POA: Insufficient documentation

## 2024-01-05 DIAGNOSIS — Z5321 Procedure and treatment not carried out due to patient leaving prior to being seen by health care provider: Secondary | ICD-10-CM | POA: Insufficient documentation

## 2024-01-05 NOTE — ED Notes (Signed)
Called for room x2, no answer.  

## 2024-01-05 NOTE — ED Triage Notes (Signed)
 Reports boil on butt x 1 week. Hx of same. Denies fever.
